# Patient Record
Sex: Male | Born: 1945 | Race: White | Hispanic: No | Marital: Married | Smoking: Never smoker
Health system: Southern US, Community
[De-identification: ages and names within clinical notes are randomized; demographics above are authoritative.]

## PROBLEM LIST (undated history)

## (undated) DIAGNOSIS — K5790 Diverticulosis of intestine, part unspecified, without perforation or abscess without bleeding: Secondary | ICD-10-CM

## (undated) DIAGNOSIS — Z87448 Personal history of other diseases of urinary system: Secondary | ICD-10-CM

## (undated) DIAGNOSIS — M85852 Other specified disorders of bone density and structure, left thigh: Secondary | ICD-10-CM

## (undated) DIAGNOSIS — I10 Essential (primary) hypertension: Secondary | ICD-10-CM

## (undated) DIAGNOSIS — S72002D Fracture of unspecified part of neck of left femur, subsequent encounter for closed fracture with routine healing: Secondary | ICD-10-CM

## (undated) DIAGNOSIS — M5126 Other intervertebral disc displacement, lumbar region: Secondary | ICD-10-CM

## (undated) DIAGNOSIS — E663 Overweight: Secondary | ICD-10-CM

## (undated) DIAGNOSIS — E785 Hyperlipidemia, unspecified: Secondary | ICD-10-CM

## (undated) DIAGNOSIS — N529 Male erectile dysfunction, unspecified: Secondary | ICD-10-CM

## (undated) DIAGNOSIS — H269 Unspecified cataract: Secondary | ICD-10-CM

## (undated) DIAGNOSIS — K219 Gastro-esophageal reflux disease without esophagitis: Secondary | ICD-10-CM

## (undated) DIAGNOSIS — R195 Other fecal abnormalities: Secondary | ICD-10-CM

## (undated) DIAGNOSIS — A419 Sepsis, unspecified organism: Secondary | ICD-10-CM

## (undated) DIAGNOSIS — M1712 Unilateral primary osteoarthritis, left knee: Secondary | ICD-10-CM

## (undated) DIAGNOSIS — I82409 Acute embolism and thrombosis of unspecified deep veins of unspecified lower extremity: Secondary | ICD-10-CM

## (undated) DIAGNOSIS — E119 Type 2 diabetes mellitus without complications: Secondary | ICD-10-CM

## (undated) HISTORY — PX: SHOULDER ARTHROSCOPY: SHX128

## (undated) HISTORY — PX: COLONOSCOPY: SHX174

## (undated) HISTORY — PX: VASECTOMY: SHX75

## (undated) HISTORY — PX: EYE SURGERY: SHX253

---

## 2005-04-04 ENCOUNTER — Ambulatory Visit: Payer: Self-pay | Admitting: Unknown Physician Specialty

## 2010-07-25 ENCOUNTER — Ambulatory Visit: Payer: Self-pay | Admitting: Unknown Physician Specialty

## 2015-02-17 ENCOUNTER — Telehealth: Payer: Self-pay | Admitting: Family Medicine

## 2015-02-17 NOTE — Telephone Encounter (Signed)
Was a patient of Dr Gwenith Daily. He is now transferring to [employer]. Patient will be running out to his medications soon. [facility] will be faxing over the medical release. Patient is requesting that we please send over at least his last office note and medication list to [facility] so he can get his refills. They will be faxing over a medical release.

## 2015-02-17 NOTE — Telephone Encounter (Signed)
Last note from the [facility] has been printed.  Awaking the medical release form from [facility] and then notes will be sent.

## 2015-02-24 NOTE — Telephone Encounter (Signed)
Sending to you

## 2015-03-07 NOTE — Telephone Encounter (Signed)
Routed to front desk this call is for records release

## 2015-07-25 ENCOUNTER — Encounter: Payer: Self-pay | Admitting: *Deleted

## 2015-07-26 ENCOUNTER — Encounter
Admission: RE | Disposition: A | Discharge status: Home / Self Care | Payer: Self-pay | Source: Ambulatory Visit | Attending: Unknown Physician Specialty

## 2015-07-26 ENCOUNTER — Encounter: Payer: Self-pay | Admitting: Anesthesiology

## 2015-07-26 ENCOUNTER — Ambulatory Visit: Payer: Medicare Other | Admitting: Anesthesiology

## 2015-07-26 ENCOUNTER — Ambulatory Visit
Admission: RE | Admit: 2015-07-26 | Discharge: 2015-07-26 | Disposition: A | Discharge status: Home / Self Care | Payer: Medicare Other | Source: Ambulatory Visit | Attending: Unknown Physician Specialty | Admitting: Unknown Physician Specialty

## 2015-07-26 DIAGNOSIS — M1712 Unilateral primary osteoarthritis, left knee: Secondary | ICD-10-CM | POA: Insufficient documentation

## 2015-07-26 DIAGNOSIS — K5730 Diverticulosis of large intestine without perforation or abscess without bleeding: Secondary | ICD-10-CM | POA: Diagnosis not present

## 2015-07-26 DIAGNOSIS — Z79899 Other long term (current) drug therapy: Secondary | ICD-10-CM | POA: Diagnosis not present

## 2015-07-26 DIAGNOSIS — K640 First degree hemorrhoids: Secondary | ICD-10-CM | POA: Insufficient documentation

## 2015-07-26 DIAGNOSIS — E119 Type 2 diabetes mellitus without complications: Secondary | ICD-10-CM | POA: Diagnosis not present

## 2015-07-26 DIAGNOSIS — Z7982 Long term (current) use of aspirin: Secondary | ICD-10-CM | POA: Insufficient documentation

## 2015-07-26 DIAGNOSIS — Z6825 Body mass index (BMI) 25.0-25.9, adult: Secondary | ICD-10-CM | POA: Diagnosis not present

## 2015-07-26 DIAGNOSIS — Z9852 Vasectomy status: Secondary | ICD-10-CM | POA: Diagnosis not present

## 2015-07-26 DIAGNOSIS — Z9889 Other specified postprocedural states: Secondary | ICD-10-CM | POA: Diagnosis not present

## 2015-07-26 DIAGNOSIS — Z800 Family history of malignant neoplasm of digestive organs: Secondary | ICD-10-CM | POA: Insufficient documentation

## 2015-07-26 DIAGNOSIS — I10 Essential (primary) hypertension: Secondary | ICD-10-CM | POA: Diagnosis not present

## 2015-07-26 DIAGNOSIS — K219 Gastro-esophageal reflux disease without esophagitis: Secondary | ICD-10-CM | POA: Insufficient documentation

## 2015-07-26 DIAGNOSIS — Z1211 Encounter for screening for malignant neoplasm of colon: Secondary | ICD-10-CM | POA: Insufficient documentation

## 2015-07-26 DIAGNOSIS — E785 Hyperlipidemia, unspecified: Secondary | ICD-10-CM | POA: Diagnosis not present

## 2015-07-26 DIAGNOSIS — H269 Unspecified cataract: Secondary | ICD-10-CM | POA: Insufficient documentation

## 2015-07-26 DIAGNOSIS — E663 Overweight: Secondary | ICD-10-CM | POA: Insufficient documentation

## 2015-07-26 HISTORY — DX: Diverticulosis of intestine, part unspecified, without perforation or abscess without bleeding: K57.90

## 2015-07-26 HISTORY — DX: Type 2 diabetes mellitus without complications: E11.9

## 2015-07-26 HISTORY — PX: COLONOSCOPY: SHX5424

## 2015-07-26 HISTORY — DX: Overweight: E66.3

## 2015-07-26 HISTORY — DX: Other intervertebral disc displacement, lumbar region: M51.26

## 2015-07-26 HISTORY — DX: Gastro-esophageal reflux disease without esophagitis: K21.9

## 2015-07-26 HISTORY — DX: Other fecal abnormalities: R19.5

## 2015-07-26 HISTORY — DX: Essential (primary) hypertension: I10

## 2015-07-26 HISTORY — DX: Unilateral primary osteoarthritis, left knee: M17.12

## 2015-07-26 HISTORY — DX: Unspecified cataract: H26.9

## 2015-07-26 HISTORY — DX: Hyperlipidemia, unspecified: E78.5

## 2015-07-26 HISTORY — DX: Male erectile dysfunction, unspecified: N52.9

## 2015-07-26 LAB — GLUCOSE, CAPILLARY: GLUCOSE-CAPILLARY: 153 mg/dL — AB (ref 65–99)

## 2015-07-26 SURGERY — COLONOSCOPY
Anesthesia: General

## 2015-07-26 MED ORDER — FENTANYL CITRATE (PF) 100 MCG/2ML IJ SOLN
INTRAMUSCULAR | Status: DC | PRN
  Administered 2015-07-26: 50 ug via INTRAVENOUS

## 2015-07-26 MED ORDER — SODIUM CHLORIDE 0.9 % IV SOLN: INTRAVENOUS | Status: DC

## 2015-07-26 MED ORDER — PROPOFOL 500 MG/50ML IV EMUL
INTRAVENOUS | Status: DC | PRN
  Administered 2015-07-26: 140 ug/kg/min via INTRAVENOUS

## 2015-07-26 MED ORDER — PROPOFOL 10 MG/ML IV BOLUS
INTRAVENOUS | Status: DC | PRN
  Administered 2015-07-26: 40 mg via INTRAVENOUS

## 2015-07-26 MED ORDER — SODIUM CHLORIDE 0.9 % IV SOLN
INTRAVENOUS | Status: DC
  Administered 2015-07-26: 09:00:00 via INTRAVENOUS

## 2015-07-26 MED ORDER — MIDAZOLAM HCL 5 MG/5ML IJ SOLN
INTRAMUSCULAR | Status: DC | PRN
  Administered 2015-07-26: 1 mg via INTRAVENOUS

## 2015-07-26 MED ORDER — EPHEDRINE SULFATE 50 MG/ML IJ SOLN
INTRAMUSCULAR | Status: DC | PRN
  Administered 2015-07-26: 5 mg via INTRAVENOUS

## 2015-07-26 NOTE — Anesthesia Postprocedure Evaluation (Signed)
Anesthesia Post Note  Patient: Douglas Lyons  Procedure(s) Performed: Procedure(s) (LRB): COLONOSCOPY (N/A)  Patient location during evaluation: PACU Anesthesia Type: General Level of consciousness: awake Pain management: pain level controlled Vital Signs Assessment: post-procedure vital signs reviewed and stable Respiratory status: spontaneous breathing Cardiovascular status: blood pressure returned to baseline Anesthetic complications: no    Last Vitals:  Filed Vitals:   07/26/15 1000 07/26/15 1010  BP: 122/52 122/58  Pulse: 57 53  Temp:    Resp: 14 16    Last Pain: There were no vitals filed for this visit.               Erhardt Dada S

## 2015-07-26 NOTE — H&P (Signed)
   Primary Care Physician:  Pcp Not In System Primary Gastroenterologist:  Dr. Vira Agar  Pre-Procedure History & Physical: HPI:  Douglas Lyons is a 69 y.o. male is here for an colonoscopy.   Past Medical History  Diagnosis Date  . Cataracts, bilateral   . Diabetes mellitus, type 2 ([place])   . Diverticulosis   . Degenerative joint disease of knee, left   . Erectile dysfunction   . GERD (gastroesophageal reflux disease)   . Heme positive stool   . Hyperlipidemia   . Hypertension   . Lumbar disc herniation   . Overweight (BMI 25.0-29.9)     Past Surgical History  Procedure Laterality Date  . Shoulder arthroscopy Right   . Vasectomy    . Colonoscopy      Prior to Admission medications   Medication Sig Start Date End Date Taking? Authorizing Provider  aspirin EC 81 MG tablet Take 81 mg by mouth daily.   Yes Historical Provider, MD  enalapril (VASOTEC) 5 MG tablet Take 5 mg by mouth daily.   Yes Historical Provider, MD  glipiZIDE-metformin (METAGLIP) 2.5-500 MG tablet Take 1 tablet by mouth 2 (two) times daily before a meal.   Yes Historical Provider, MD  polyethylene glycol powder (GLYCOLAX/MIRALAX) powder Take 0.5 Containers by mouth once.   Yes Historical Provider, MD  rosuvastatin (CRESTOR) 20 MG tablet Take 20 mg by mouth daily.   Yes Historical Provider, MD    Allergies as of 06/16/2015  . (Not on File)    History reviewed. No pertinent family history.  Social History   Social History  . Marital Status: Married    Spouse Name: N/A  . Number of Children: N/A  . Years of Education: N/A   Occupational History  . Not on file.   Social History Main Topics  . Smoking status: Never Smoker   . Smokeless tobacco: Not on file  . Alcohol Use: Not on file  . Drug Use: Not on file  . Sexual Activity: Not on file   Other Topics Concern  . Not on file   Social History Narrative    Review of Systems: See HPI, otherwise negative ROS  Physical Exam: BP 145/76 mmHg   Pulse 67  Temp(Src) 98.6 F (37 C) (Oral)  Resp 17  Ht 5\' 10"  (1.778 m)  Wt 98.884 kg (218 lb)  BMI 31.28 kg/m2  SpO2 100% General:   Alert,  pleasant and cooperative in NAD Head:  Normocephalic and atraumatic. Neck:  Supple; no masses or thyromegaly. Lungs:  Clear throughout to auscultation.    Heart:  Regular rate and rhythm. Abdomen:  Soft, nontender and nondistended. Normal bowel sounds, without guarding, and without rebound.   Neurologic:  Alert and  oriented x4;  grossly normal neurologically.  Impression/Plan: Douglas Lyons is here for an colonoscopy to be performed for FH colon cancer in father  Risks, benefits, limitations, and alternatives regarding  colonoscopy have been reviewed with the patient.  Questions have been answered.  All parties agreeable.   Gaylyn Cheers, MD  07/26/2015, 9:08 AM

## 2015-07-26 NOTE — Anesthesia Procedure Notes (Signed)
Date/Time: 07/26/2015 9:12 AM Performed by: Kennon Holter Pre-anesthesia Checklist: Timeout performed, Patient being monitored, Patient identified, Emergency Drugs available and Suction available Patient Re-evaluated:Patient Re-evaluated prior to inductionOxygen Delivery Method: Nasal cannula Preoxygenation: Pre-oxygenation with 100% oxygen Intubation Type: IV induction

## 2015-07-26 NOTE — Op Note (Signed)
[facility] Gastroenterology Patient Name: Douglas Lyons Procedure Date: 07/26/2015 8:46 AM MRN: [id number] Account #: [id number] Date of Birth: 06-05-1946 Admit Type: Outpatient Age: 69 Room: [facility] ENDO ROOM 3 Gender: Male Note Status: Finalized Procedure:         Colonoscopy Indications:       Screening in patient at increased risk: Family history of                     1st-degree relative with colorectal cancer Providers:         Manya Silvas, MD Medicines:         Propofol per Anesthesia Complications:     No immediate complications. Procedure:         Pre-Anesthesia Assessment:                    - After reviewing the risks and benefits, the patient was                     deemed in satisfactory condition to undergo the procedure.                    After obtaining informed consent, the colonoscope was                     passed under direct vision. Throughout the procedure, the                     patient's blood pressure, pulse, and oxygen saturations                     were monitored continuously. The Olympus PCF-H180AL                     colonoscope ( S#: [id number] ) was introduced through the                     anus and advanced to the the cecum, identified by                     appendiceal orifice and ileocecal valve. The colonoscopy                     was performed without difficulty. The patient tolerated                     the procedure well. The quality of the bowel preparation                     was good. Findings:      A few small-mouthed diverticula were found in the sigmoid colon.      Internal hemorrhoids were found during endoscopy. The hemorrhoids were       small and Grade I (internal hemorrhoids that do not prolapse).      The exam was otherwise without abnormality. Impression:        - Diverticulosis in the sigmoid colon.                    - Internal hemorrhoids.                    - The examination was otherwise normal.               - No specimens collected. Recommendation:    -  Repeat colonoscopy in 5 years for screening purposes. Manya Silvas, MD 07/26/2015 9:27:09 AM This report has been signed electronically. Number of Addenda: 0 Note Initiated On: 07/26/2015 8:46 AM Scope Withdrawal Time: 0 hours 4 minutes 48 seconds  Total Procedure Duration: 0 hours 10 minutes 3 seconds       [facility]

## 2015-07-26 NOTE — Anesthesia Preprocedure Evaluation (Signed)
Anesthesia Evaluation  Patient identified by MRN, date of birth, ID band Patient awake    Reviewed: Allergy & Precautions, NPO status , Patient's Chart, lab work & pertinent test results, reviewed documented beta blocker date and time   Airway Mallampati: II  TM Distance: >3 FB     Dental  (+) Chipped   Pulmonary           Cardiovascular hypertension, Pt. on medications      Neuro/Psych    GI/Hepatic GERD  ,  Endo/Other  diabetes, Well Controlled, Type 2  Renal/GU      Musculoskeletal  (+) Arthritis ,   Abdominal   Peds  Hematology   Anesthesia Other Findings   Reproductive/Obstetrics                             Anesthesia Physical Anesthesia Plan  ASA: III  Anesthesia Plan: General   Post-op Pain Management:    Induction: Intravenous  Airway Management Planned: Nasal Cannula  Additional Equipment:   Intra-op Plan:   Post-operative Plan:   Informed Consent: I have reviewed the patients History and Physical, chart, labs and discussed the procedure including the risks, benefits and alternatives for the proposed anesthesia with the patient or authorized representative who has indicated his/her understanding and acceptance.     Plan Discussed with: CRNA  Anesthesia Plan Comments:         Anesthesia Quick Evaluation

## 2015-07-26 NOTE — Transfer of Care (Signed)
zImmediate Anesthesia Transfer of Care Note  Patient: Douglas Lyons  Procedure(s) Performed: Procedure(s): COLONOSCOPY (N/A)  Patient Location: PACU and Endoscopy Unit  Anesthesia Type:General  Level of Consciousness: awake and sedated  Airway & Oxygen Therapy: Patient Spontanous Breathing and Patient connected to nasal cannula oxygen  Post-op Assessment: Report given to RN and Post -op Vital signs reviewed and stable  Post vital signs: Reviewed and stable  Last Vitals:  Filed Vitals:   07/26/15 0813 07/26/15 0929  BP: 145/76   Pulse: 67   Temp: 37 C 35.8 C  Resp: 17     Complications: No apparent anesthesia complications.  Patient tolerated procedure well.

## 2015-07-31 ENCOUNTER — Encounter: Payer: Self-pay | Admitting: Unknown Physician Specialty

## 2019-04-22 ENCOUNTER — Other Ambulatory Visit: Payer: Self-pay

## 2019-04-22 DIAGNOSIS — Z20822 Contact with and (suspected) exposure to covid-19: Secondary | ICD-10-CM

## 2019-04-23 LAB — NOVEL CORONAVIRUS, NAA: SARS-CoV-2, NAA: NOT DETECTED

## 2020-03-13 DIAGNOSIS — Z85828 Personal history of other malignant neoplasm of skin: Secondary | ICD-10-CM | POA: Insufficient documentation

## 2020-09-27 ENCOUNTER — Other Ambulatory Visit: Payer: Self-pay

## 2020-09-27 ENCOUNTER — Other Ambulatory Visit
Admission: RE | Admit: 2020-09-27 | Discharge: 2020-09-27 | Disposition: A | Discharge status: Home / Self Care | Payer: Medicare Other | Source: Ambulatory Visit | Attending: Gastroenterology | Admitting: Gastroenterology

## 2020-09-27 DIAGNOSIS — Z01812 Encounter for preprocedural laboratory examination: Secondary | ICD-10-CM | POA: Insufficient documentation

## 2020-09-27 DIAGNOSIS — Z20822 Contact with and (suspected) exposure to covid-19: Secondary | ICD-10-CM | POA: Insufficient documentation

## 2020-09-27 LAB — SARS CORONAVIRUS 2 (TAT 6-24 HRS): SARS Coronavirus 2: NEGATIVE

## 2020-09-29 ENCOUNTER — Ambulatory Visit
Admission: RE | Admit: 2020-09-29 | Discharge: 2020-09-29 | Disposition: A | Discharge status: Home / Self Care | Payer: Medicare Other | Attending: Gastroenterology | Admitting: Gastroenterology

## 2020-09-29 ENCOUNTER — Encounter: Payer: Self-pay | Admitting: *Deleted

## 2020-09-29 ENCOUNTER — Encounter
Admission: RE | Disposition: A | Discharge status: Home / Self Care | Payer: Self-pay | Source: Home / Self Care | Attending: Gastroenterology

## 2020-09-29 ENCOUNTER — Other Ambulatory Visit: Payer: Self-pay

## 2020-09-29 ENCOUNTER — Ambulatory Visit: Payer: Medicare Other | Admitting: Anesthesiology

## 2020-09-29 DIAGNOSIS — Z1211 Encounter for screening for malignant neoplasm of colon: Secondary | ICD-10-CM | POA: Diagnosis not present

## 2020-09-29 DIAGNOSIS — Z79899 Other long term (current) drug therapy: Secondary | ICD-10-CM | POA: Insufficient documentation

## 2020-09-29 DIAGNOSIS — K5730 Diverticulosis of large intestine without perforation or abscess without bleeding: Secondary | ICD-10-CM | POA: Insufficient documentation

## 2020-09-29 DIAGNOSIS — Z7984 Long term (current) use of oral hypoglycemic drugs: Secondary | ICD-10-CM | POA: Diagnosis not present

## 2020-09-29 DIAGNOSIS — D123 Benign neoplasm of transverse colon: Secondary | ICD-10-CM | POA: Diagnosis not present

## 2020-09-29 DIAGNOSIS — Z7982 Long term (current) use of aspirin: Secondary | ICD-10-CM | POA: Insufficient documentation

## 2020-09-29 DIAGNOSIS — Z8371 Family history of colonic polyps: Secondary | ICD-10-CM | POA: Diagnosis not present

## 2020-09-29 DIAGNOSIS — K640 First degree hemorrhoids: Secondary | ICD-10-CM | POA: Diagnosis not present

## 2020-09-29 DIAGNOSIS — Z800 Family history of malignant neoplasm of digestive organs: Secondary | ICD-10-CM | POA: Diagnosis not present

## 2020-09-29 HISTORY — PX: COLONOSCOPY: SHX5424

## 2020-09-29 LAB — GLUCOSE, CAPILLARY: Glucose-Capillary: 151 mg/dL — ABNORMAL HIGH (ref 70–99)

## 2020-09-29 SURGERY — COLONOSCOPY
Anesthesia: General

## 2020-09-29 MED ORDER — PROPOFOL 500 MG/50ML IV EMUL
INTRAVENOUS | Status: AC
  Filled 2020-09-29: qty 50

## 2020-09-29 MED ORDER — FENTANYL CITRATE (PF) 100 MCG/2ML IJ SOLN
INTRAMUSCULAR | Status: AC
  Filled 2020-09-29: qty 2

## 2020-09-29 MED ORDER — PROPOFOL 500 MG/50ML IV EMUL
INTRAVENOUS | Status: DC | PRN
  Administered 2020-09-29: 50 ug/kg/min via INTRAVENOUS

## 2020-09-29 MED ORDER — SODIUM CHLORIDE 0.9 % IV SOLN: INTRAVENOUS | Status: DC

## 2020-09-29 MED ORDER — MIDAZOLAM HCL 5 MG/5ML IJ SOLN
INTRAMUSCULAR | Status: DC | PRN
  Administered 2020-09-29 (×2): 1 mg via INTRAVENOUS

## 2020-09-29 MED ORDER — PROPOFOL 10 MG/ML IV BOLUS
INTRAVENOUS | Status: DC | PRN
  Administered 2020-09-29: 10 mg via INTRAVENOUS
  Administered 2020-09-29 (×2): 20 mg via INTRAVENOUS

## 2020-09-29 MED ORDER — FENTANYL CITRATE (PF) 100 MCG/2ML IJ SOLN
INTRAMUSCULAR | Status: DC | PRN
  Administered 2020-09-29 (×4): 25 ug via INTRAVENOUS

## 2020-09-29 MED ORDER — LIDOCAINE HCL (PF) 2 % IJ SOLN
INTRAMUSCULAR | Status: DC | PRN
  Administered 2020-09-29: 50 mg

## 2020-09-29 MED ORDER — MIDAZOLAM HCL 2 MG/2ML IJ SOLN
INTRAMUSCULAR | Status: AC
  Filled 2020-09-29: qty 2

## 2020-09-29 NOTE — Interval H&P Note (Signed)
History and Physical Interval Note:  09/29/2020 10:00 AM  Douglas Lyons  has presented today for surgery, with the diagnosis of FAM HX OF COLON CANCER.  The various methods of treatment have been discussed with the patient and family. After consideration of risks, benefits and other options for treatment, the patient has consented to  Procedure(s): COLONOSCOPY (N/A) as a surgical intervention.  The patient's history has been reviewed, patient examined, no change in status, stable for surgery.  I have reviewed the patient's chart and labs.  Questions were answered to the patient's satisfaction.     [name]  Ok to proceed with colonoscopy

## 2020-09-29 NOTE — Op Note (Signed)
[facility] Gastroenterology Patient Name: Douglas Lyons Procedure Date: 09/29/2020 9:51 AM MRN: [id number] Account #: [id number] Date of Birth: August 06, 1946 Admit Type: Outpatient Age: 75 Room: [facility] ENDO ROOM 3 Gender: Male Note Status: Finalized Procedure:             Colonoscopy Indications:           Screening in patient at increased risk: Family history                         of 1st-degree relative with colorectal cancer Providers:             Andrey Farmer MD, MD Referring MD:          Juluis Rainier (Referring MD) Medicines:             Monitored Anesthesia Care Complications:         No immediate complications. Estimated blood loss:                         Minimal. Procedure:             Pre-Anesthesia Assessment:                        - Prior to the procedure, a History and Physical was                         performed, and patient medications and allergies were                         reviewed. The patient is competent. The risks and                         benefits of the procedure and the sedation options and                         risks were discussed with the patient. All questions                         were answered and informed consent was obtained.                         Patient identification and proposed procedure were                         verified by the physician, the nurse, the anesthetist                         and the technician in the endoscopy suite. Mental                         Status Examination: alert and oriented. Airway                         Examination: normal oropharyngeal airway and neck                         mobility. Respiratory Examination: clear to  auscultation. CV Examination: normal. Prophylactic                         Antibiotics: The patient does not require prophylactic                         antibiotics. Prior Anticoagulants: The patient has                         taken no  previous anticoagulant or antiplatelet                         agents. ASA Grade Assessment: II - A patient with mild                         systemic disease. After reviewing the risks and                         benefits, the patient was deemed in satisfactory                         condition to undergo the procedure. The anesthesia                         plan was to use monitored anesthesia care (MAC).                         Immediately prior to administration of medications,                         the patient was re-assessed for adequacy to receive                         sedatives. The heart rate, respiratory rate, oxygen                         saturations, blood pressure, adequacy of pulmonary                         ventilation, and response to care were monitored                         throughout the procedure. The physical status of the                         patient was re-assessed after the procedure.                        After obtaining informed consent, the colonoscope was                         passed under direct vision. Throughout the procedure,                         the patient's blood pressure, pulse, and oxygen                         saturations were monitored continuously. The  Colonoscope was introduced through the anus and                         advanced to the the cecum, identified by appendiceal                         orifice and ileocecal valve. The colonoscopy was                         performed without difficulty. The patient tolerated                         the procedure well. The quality of the bowel                         preparation was good. Findings:      The perianal and digital rectal examinations were normal.      A less than 1 mm polyp was found in the proximal transverse colon. The       polyp was sessile. The polyp was removed with a jumbo cold forceps.       Resection and retrieval were complete. Estimated  blood loss was minimal.      A few small-mouthed diverticula were found in the sigmoid colon.      Internal hemorrhoids were found during retroflexion. The hemorrhoids       were Grade I (internal hemorrhoids that do not prolapse).      The exam was otherwise without abnormality on direct and retroflexion       views. Impression:            - One less than 1 mm polyp in the proximal transverse                         colon, removed with a jumbo cold forceps. Resected and                         retrieved.                        - Diverticulosis in the sigmoid colon.                        - Internal hemorrhoids.                        - The examination was otherwise normal on direct and                         retroflexion views. Recommendation:        - Discharge patient to home.                        - Resume previous diet.                        - Continue present medications.                        - Await pathology results.                        -  Repeat colonoscopy is not recommended due to current                         age (22 years or older) for surveillance.                        - Return to referring physician as previously                         scheduled. Procedure Code(s):     --- Professional ---                        [phone], Colonoscopy, flexible; with biopsy, single or                         multiple Diagnosis Code(s):     --- Professional ---                        Z80.0, Family history of malignant neoplasm of                         digestive organs                        K63.5, Polyp of colon                        K64.0, First degree hemorrhoids                        K57.30, Diverticulosis of large intestine without                         perforation or abscess without bleeding CPT copyright 2019 American Medical Association. All rights reserved. The codes documented in this report are preliminary and upon coder review may  be revised to meet current  compliance requirements. Andrey Farmer MD, MD 09/29/2020 10:29:52 AM Number of Addenda: 0 Note Initiated On: 09/29/2020 9:51 AM Scope Withdrawal Time: 0 hours 6 minutes 48 seconds  Total Procedure Duration: 0 hours 13 minutes 49 seconds  Estimated Blood Loss:  Estimated blood loss was minimal.      [facility]

## 2020-09-29 NOTE — Anesthesia Preprocedure Evaluation (Signed)
Anesthesia Evaluation  Patient identified by MRN, date of birth, ID band Patient awake    Reviewed: Allergy & Precautions, NPO status , Patient's Chart, lab work & pertinent test results  History of Anesthesia Complications Negative for: history of anesthetic complications  Airway Mallampati: II       Dental   Pulmonary neg sleep apnea, neg COPD, Not current smoker,           Cardiovascular hypertension, Pt. on medications (-) Past MI and (-) CHF (-) dysrhythmias (-) Valvular Problems/Murmurs     Neuro/Psych neg Seizures    GI/Hepatic Neg liver ROS, neg GERD  ,  Endo/Other  diabetes, Type 2, Oral Hypoglycemic Agents  Renal/GU negative Renal ROS     Musculoskeletal   Abdominal   Peds  Hematology   Anesthesia Other Findings   Reproductive/Obstetrics                             Anesthesia Physical Anesthesia Plan  ASA: III  Anesthesia Plan: General   Post-op Pain Management:    Induction: Intravenous  PONV Risk Score and Plan: 2 and Propofol infusion and TIVA  Airway Management Planned: Nasal Cannula  Additional Equipment:   Intra-op Plan:   Post-operative Plan:   Informed Consent: I have reviewed the patients History and Physical, chart, labs and discussed the procedure including the risks, benefits and alternatives for the proposed anesthesia with the patient or authorized representative who has indicated his/her understanding and acceptance.       Plan Discussed with:   Anesthesia Plan Comments:         Anesthesia Quick Evaluation  

## 2020-09-29 NOTE — Anesthesia Postprocedure Evaluation (Signed)
Anesthesia Post Note  Patient: Douglas Lyons  Procedure(s) Performed: COLONOSCOPY (N/A )  Patient location during evaluation: Endoscopy Anesthesia Type: General Level of consciousness: awake and alert Pain management: pain level controlled Vital Signs Assessment: post-procedure vital signs reviewed and stable Respiratory status: spontaneous breathing and respiratory function stable Cardiovascular status: stable Anesthetic complications: no   No complications documented.   Last Vitals:  Vitals:   09/29/20 0857 09/29/20 1029  BP: 139/77   Pulse: 73 62  Resp: 18 11  Temp: 36.8 C (!) 35.9 C  SpO2: 100% 99%    Last Pain:  Vitals:   09/29/20 1029  TempSrc: Temporal  PainSc: Asleep                 [name]

## 2020-09-29 NOTE — Transfer of Care (Signed)
Immediate Anesthesia Transfer of Care Note  Patient: Douglas Lyons  Procedure(s) Performed: COLONOSCOPY (N/A )  Patient Location: PACU  Anesthesia Type:General  Level of Consciousness: sedated  Airway & Oxygen Therapy: Patient Spontanous Breathing and Patient connected to nasal cannula oxygen  Post-op Assessment: Report given to RN and Post -op Vital signs reviewed and stable  Post vital signs: Reviewed and stable  Last Vitals:  Vitals Value Taken Time  BP 123/66 09/29/20 1029  Temp 35.9 C 09/29/20 1029  Pulse 61 09/29/20 1030  Resp 11 09/29/20 1030  SpO2 100 % 09/29/20 1030  Vitals shown include unvalidated device data.  Last Pain:  Vitals:   09/29/20 1029  TempSrc: Temporal  PainSc: Asleep         Complications: No complications documented.

## 2020-09-29 NOTE — H&P (Signed)
Outpatient short stay form Pre-procedure 09/29/2020 9:58 AM Raylene Miyamoto MD, MPH  Primary Physician: NP Gauger  Reason for visit:  Family history of colon cancer  History of present illness:   75 y/o gentleman with family history of colon cancer in his father in his 69's. Previous colonoscopies without polyps. No blood thinners. No abdominal surgeries. No new GI symptoms.    Current Facility-Administered Medications:  .  0.9 %  sodium chloride infusion, , Intravenous, Continuous, Darthula Desa, Hilton Cork, MD, Last Rate: 20 mL/hr at 09/29/20 8502, Continued from Pre-op at 09/29/20 0956  Medications Prior to Admission  Medication Sig Dispense Refill Last Dose  . aspirin EC 81 MG tablet Take 81 mg by mouth daily.   Past Week at Unknown time  . enalapril (VASOTEC) 5 MG tablet Take 5 mg by mouth daily.   09/29/2020 at Unknown time  . glipiZIDE-metformin (METAGLIP) 2.5-500 MG tablet Take 1 tablet by mouth 2 (two) times daily before a meal.   09/28/2020 at Unknown time  . polyethylene glycol powder (GLYCOLAX/MIRALAX) powder Take 0.5 Containers by mouth once.   09/28/2020 at Unknown time  . rosuvastatin (CRESTOR) 20 MG tablet Take 20 mg by mouth daily.   09/28/2020 at Unknown time     No Known Allergies   Past Medical History:  Diagnosis Date  . Cataracts, bilateral   . Degenerative joint disease of knee, left   . Diabetes mellitus, type 2 ([place])   . Diverticulosis   . Erectile dysfunction   . GERD (gastroesophageal reflux disease)   . Heme positive stool   . Hyperlipidemia   . Hypertension   . Lumbar disc herniation   . Overweight (BMI 25.0-29.9)     Review of systems:  Otherwise negative.    Physical Exam  Gen: Alert, oriented. Appears stated age.  HEENT: PERRLA. Lungs: No respiratory distress CV: RRR Abd: soft, benign, no masses Ext: No edema    Planned procedures: Proceed with colonoscopy. The patient understands the nature of the planned procedure, indications, risks,  alternatives and potential complications including but not limited to bleeding, infection, perforation, damage to internal organs and possible oversedation/side effects from anesthesia. The patient agrees and gives consent to proceed.  Please refer to procedure notes for findings, recommendations and patient disposition/instructions.     Raylene Miyamoto MD, MPH Gastroenterology 09/29/2020  9:58 AM

## 2020-10-02 LAB — SURGICAL PATHOLOGY

## 2020-11-26 ENCOUNTER — Other Ambulatory Visit: Payer: Self-pay

## 2020-11-26 ENCOUNTER — Encounter: Payer: Self-pay | Admitting: Emergency Medicine

## 2020-11-26 ENCOUNTER — Emergency Department: Payer: Medicare Other

## 2020-11-26 ENCOUNTER — Inpatient Hospital Stay
Admission: EM | Admit: 2020-11-26 | Discharge: 2020-12-04 | DRG: 472 | Disposition: A | Discharge status: Home Health | Payer: Medicare Other | Attending: Neurosurgery | Admitting: Neurosurgery

## 2020-11-26 DIAGNOSIS — K219 Gastro-esophageal reflux disease without esophagitis: Secondary | ICD-10-CM | POA: Diagnosis present

## 2020-11-26 DIAGNOSIS — Z7982 Long term (current) use of aspirin: Secondary | ICD-10-CM

## 2020-11-26 DIAGNOSIS — Z6830 Body mass index (BMI) 30.0-30.9, adult: Secondary | ICD-10-CM

## 2020-11-26 DIAGNOSIS — Z20822 Contact with and (suspected) exposure to covid-19: Secondary | ICD-10-CM | POA: Diagnosis present

## 2020-11-26 DIAGNOSIS — Z9852 Vasectomy status: Secondary | ICD-10-CM

## 2020-11-26 DIAGNOSIS — I10 Essential (primary) hypertension: Secondary | ICD-10-CM | POA: Diagnosis present

## 2020-11-26 DIAGNOSIS — E785 Hyperlipidemia, unspecified: Secondary | ICD-10-CM | POA: Diagnosis present

## 2020-11-26 DIAGNOSIS — E119 Type 2 diabetes mellitus without complications: Secondary | ICD-10-CM | POA: Diagnosis present

## 2020-11-26 DIAGNOSIS — M4722 Other spondylosis with radiculopathy, cervical region: Secondary | ICD-10-CM | POA: Diagnosis present

## 2020-11-26 DIAGNOSIS — R1310 Dysphagia, unspecified: Secondary | ICD-10-CM | POA: Diagnosis not present

## 2020-11-26 DIAGNOSIS — K5790 Diverticulosis of intestine, part unspecified, without perforation or abscess without bleeding: Secondary | ICD-10-CM | POA: Diagnosis present

## 2020-11-26 DIAGNOSIS — G992 Myelopathy in diseases classified elsewhere: Secondary | ICD-10-CM | POA: Diagnosis present

## 2020-11-26 DIAGNOSIS — M50021 Cervical disc disorder at C4-C5 level with myelopathy: Secondary | ICD-10-CM | POA: Diagnosis present

## 2020-11-26 DIAGNOSIS — M1712 Unilateral primary osteoarthritis, left knee: Secondary | ICD-10-CM | POA: Diagnosis present

## 2020-11-26 DIAGNOSIS — R2681 Unsteadiness on feet: Secondary | ICD-10-CM | POA: Diagnosis present

## 2020-11-26 DIAGNOSIS — M5011 Cervical disc disorder with radiculopathy,  high cervical region: Secondary | ICD-10-CM | POA: Diagnosis present

## 2020-11-26 DIAGNOSIS — Z7984 Long term (current) use of oral hypoglycemic drugs: Secondary | ICD-10-CM

## 2020-11-26 DIAGNOSIS — M4802 Spinal stenosis, cervical region: Secondary | ICD-10-CM | POA: Diagnosis not present

## 2020-11-26 DIAGNOSIS — M4712 Other spondylosis with myelopathy, cervical region: Secondary | ICD-10-CM | POA: Diagnosis present

## 2020-11-26 DIAGNOSIS — R202 Paresthesia of skin: Secondary | ICD-10-CM

## 2020-11-26 DIAGNOSIS — E669 Obesity, unspecified: Secondary | ICD-10-CM | POA: Diagnosis present

## 2020-11-26 DIAGNOSIS — Z79899 Other long term (current) drug therapy: Secondary | ICD-10-CM

## 2020-11-26 DIAGNOSIS — M4322 Fusion of spine, cervical region: Secondary | ICD-10-CM

## 2020-11-26 DIAGNOSIS — R531 Weakness: Secondary | ICD-10-CM

## 2020-11-26 DIAGNOSIS — M5126 Other intervertebral disc displacement, lumbar region: Secondary | ICD-10-CM | POA: Diagnosis present

## 2020-11-26 DIAGNOSIS — M5001 Cervical disc disorder with myelopathy,  high cervical region: Secondary | ICD-10-CM | POA: Diagnosis present

## 2020-11-26 DIAGNOSIS — Z419 Encounter for procedure for purposes other than remedying health state, unspecified: Secondary | ICD-10-CM

## 2020-11-26 DIAGNOSIS — R609 Edema, unspecified: Secondary | ICD-10-CM | POA: Diagnosis not present

## 2020-11-26 DIAGNOSIS — M2578 Osteophyte, vertebrae: Secondary | ICD-10-CM | POA: Diagnosis present

## 2020-11-26 LAB — DIFFERENTIAL
Abs Immature Granulocytes: 0.02 10*3/uL (ref 0.00–0.07)
Basophils Absolute: 0 10*3/uL (ref 0.0–0.1)
Basophils Relative: 1 %
Eosinophils Absolute: 0.1 10*3/uL (ref 0.0–0.5)
Eosinophils Relative: 1 %
Immature Granulocytes: 0 %
Lymphocytes Relative: 16 %
Lymphs Abs: 1 10*3/uL (ref 0.7–4.0)
Monocytes Absolute: 0.6 10*3/uL (ref 0.1–1.0)
Monocytes Relative: 9 %
Neutro Abs: 4.7 10*3/uL (ref 1.7–7.7)
Neutrophils Relative %: 73 %

## 2020-11-26 LAB — COMPREHENSIVE METABOLIC PANEL
ALT: 12 U/L (ref 0–44)
AST: 12 U/L — ABNORMAL LOW (ref 15–41)
Albumin: 4.2 g/dL (ref 3.5–5.0)
Alkaline Phosphatase: 58 U/L (ref 38–126)
Anion gap: 9 (ref 5–15)
BUN: 16 mg/dL (ref 8–23)
CO2: 25 mmol/L (ref 22–32)
Calcium: 9.2 mg/dL (ref 8.9–10.3)
Chloride: 103 mmol/L (ref 98–111)
Creatinine, Ser: 0.69 mg/dL (ref 0.61–1.24)
GFR, Estimated: >60 mL/min (ref >60)
Glucose, Bld: 150 mg/dL — ABNORMAL HIGH (ref 70–99)
Potassium: 4 mmol/L (ref 3.5–5.1)
Sodium: 137 mmol/L (ref 135–145)
Total Bilirubin: 0.8 mg/dL (ref 0.3–1.2)
Total Protein: 7.1 g/dL (ref 6.5–8.1)

## 2020-11-26 LAB — PROTIME-INR
INR: 1 (ref 0.8–1.2)
Prothrombin Time: 13.1 seconds (ref 11.4–15.2)

## 2020-11-26 LAB — APTT: aPTT: 29 seconds (ref 24–36)

## 2020-11-26 LAB — CBC
HCT: 43.3 % (ref 39.0–52.0)
Hemoglobin: 14.4 g/dL (ref 13.0–17.0)
MCH: 29.6 pg (ref 26.0–34.0)
MCHC: 33.3 g/dL (ref 30.0–36.0)
MCV: 89.1 fL (ref 80.0–100.0)
Platelets: 214 10*3/uL (ref 150–400)
RBC: 4.86 MIL/uL (ref 4.22–5.81)
RDW: 12.9 % (ref 11.5–15.5)
WBC: 6.5 10*3/uL (ref 4.0–10.5)
nRBC: 0 % (ref 0.0–0.2)

## 2020-11-26 LAB — CBG MONITORING, ED: Glucose-Capillary: 154 mg/dL — ABNORMAL HIGH (ref 70–99)

## 2020-11-26 IMAGING — CT CT HEAD W/O CM
3 series · 16 of 47 positions shown, 19 images · non-contrast
Comparison: None.

CLINICAL DATA: Neuro deficit. Acute stroke suspected. Bilateral
high on numbness for 2 weeks.

EXAM:
CT HEAD WITHOUT CONTRAST
TECHNIQUE: Contiguous axial images were obtained from the base of the skull
through the vertex without intravenous contrast.

[Series 2: head wo · axial · 0.49mm/px · z∈[-204,-54]mm · 10 of 36 slices shown, 13 images]
[im 3/36  brain]
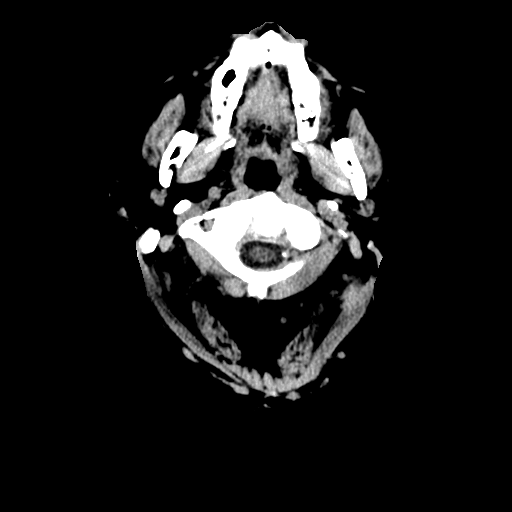
[im 3/36  bone]
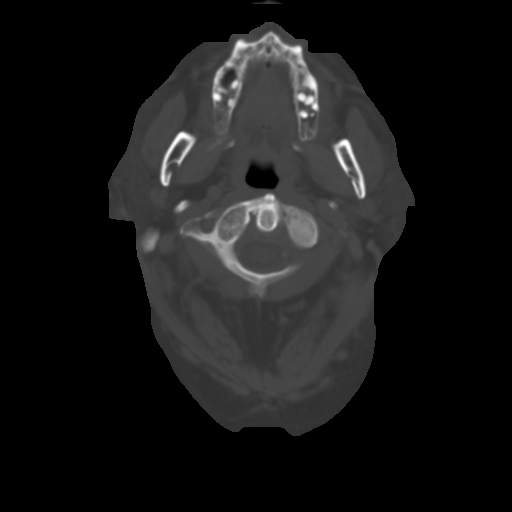
[im 7/36  brain]
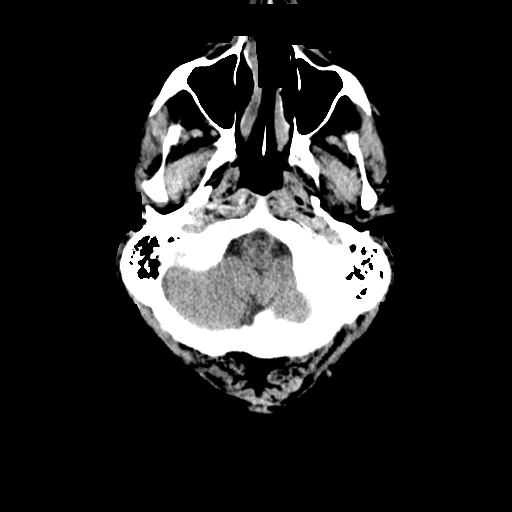
[im 10/36  brain]
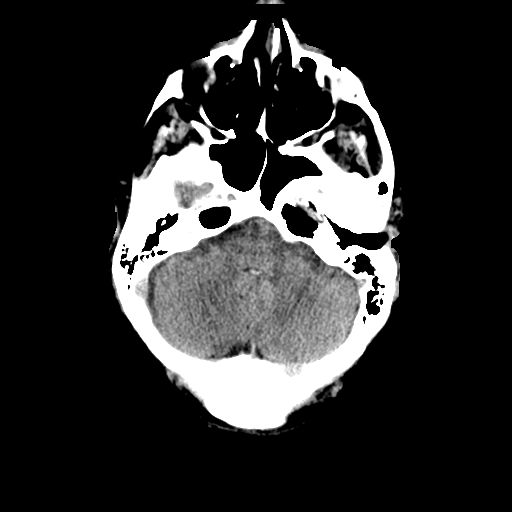
[im 13/36  brain]
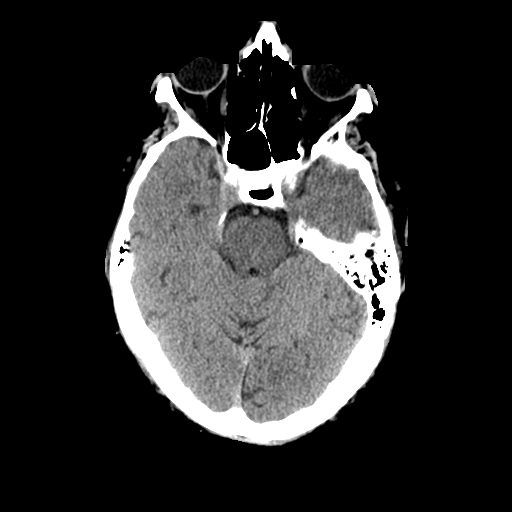
[im 16/36  brain]
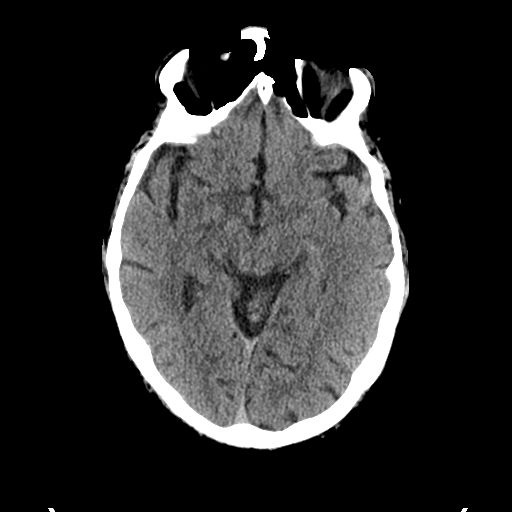
[im 16/36  bone]
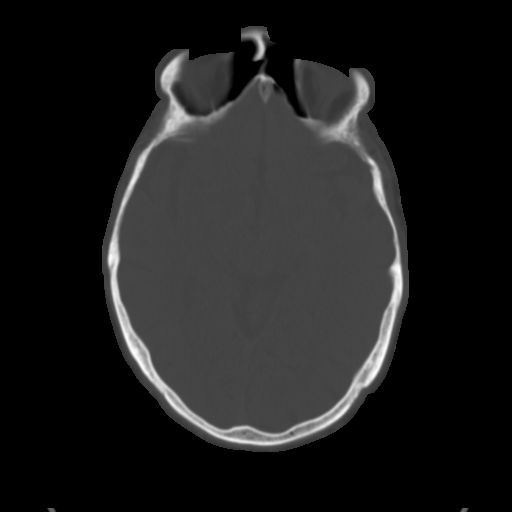
[im 20/36  brain]
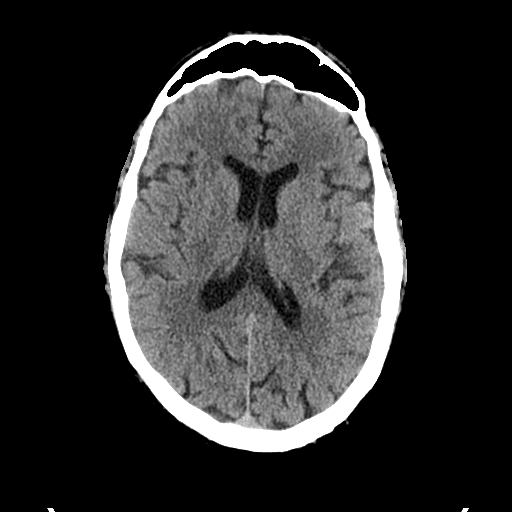
[im 23/36  brain]
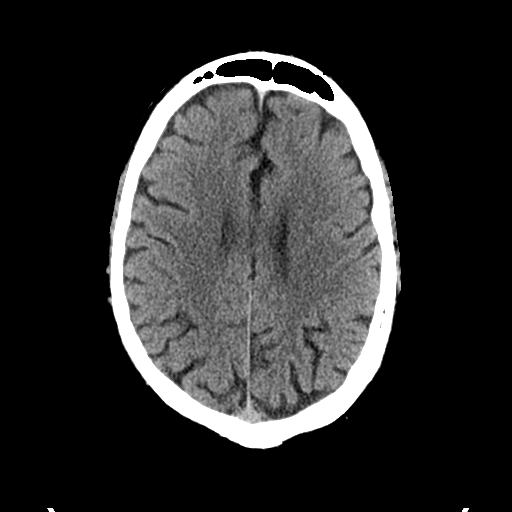
[im 27/36  brain]
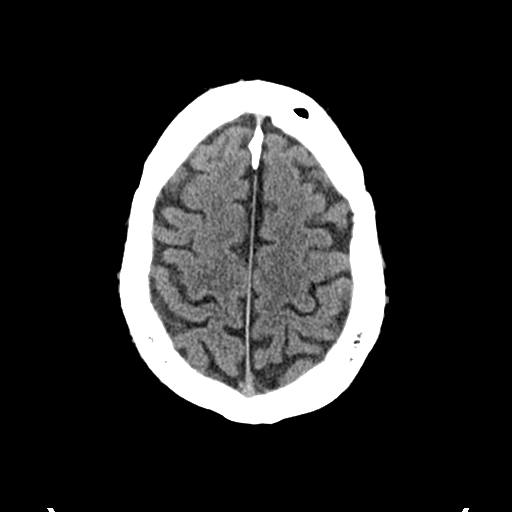
[im 29/36  brain]
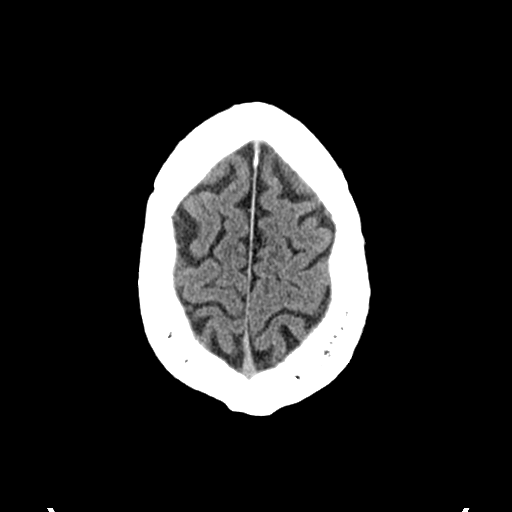
[im 29/36  bone]
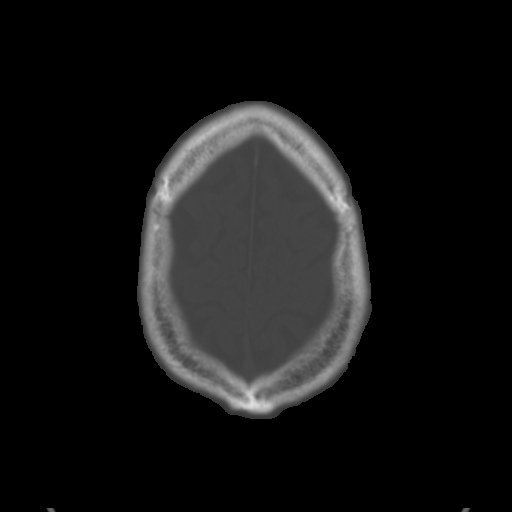
[im 33/36  brain]
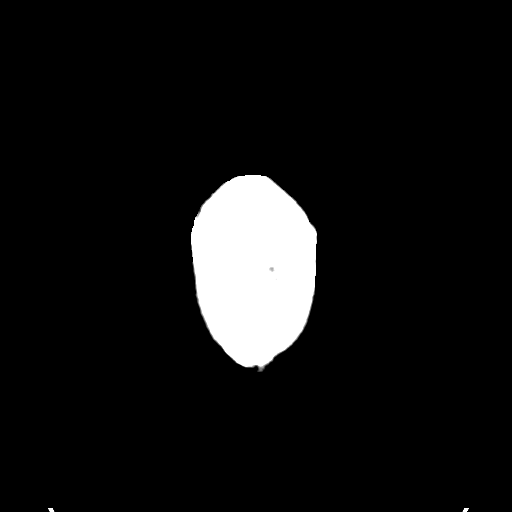

[Series 4: coronal soft tissue · coronal · 0.35mm/px · 3 of 79 slices shown]
[im 27/79  brain]
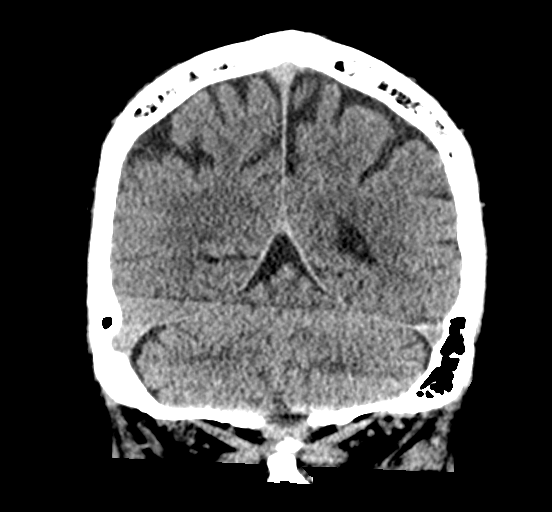
[im 35/79  brain]
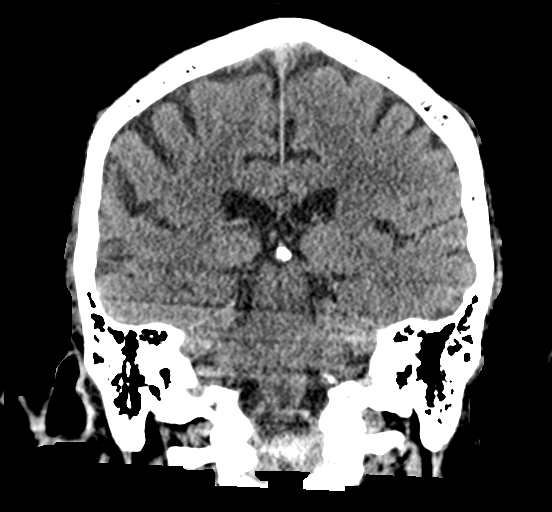
[im 44/79  brain]
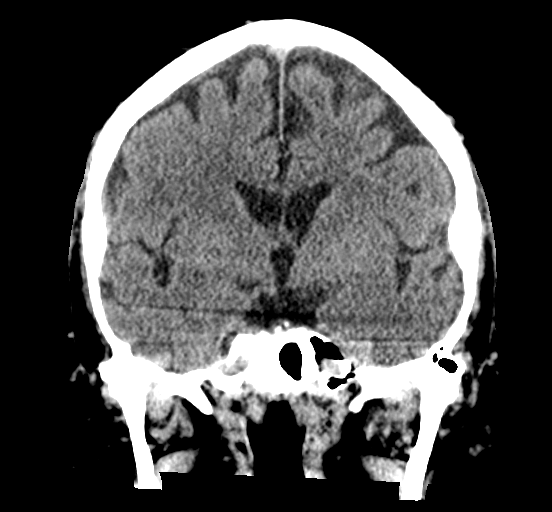

[Series 5: sagittal soft tissue · sagittal · 0.38mm/px · 3 of 63 slices shown]
[im 21/63  brain]
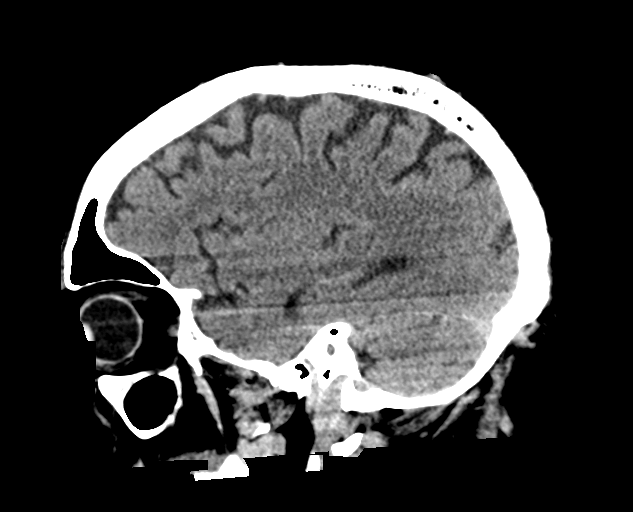
[im 32/63  brain]
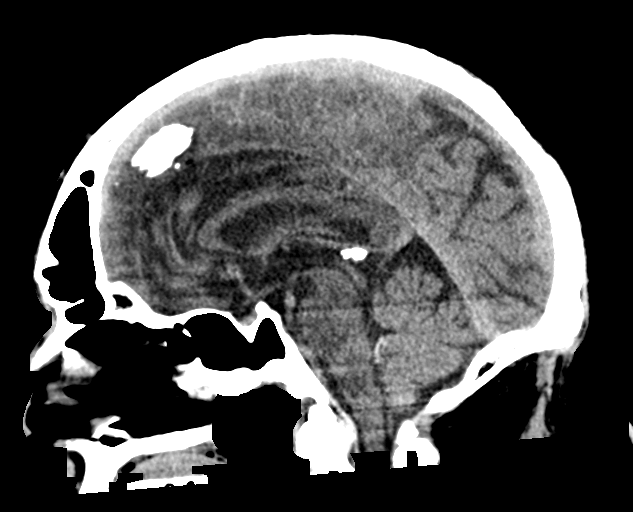
[im 42/63  brain]
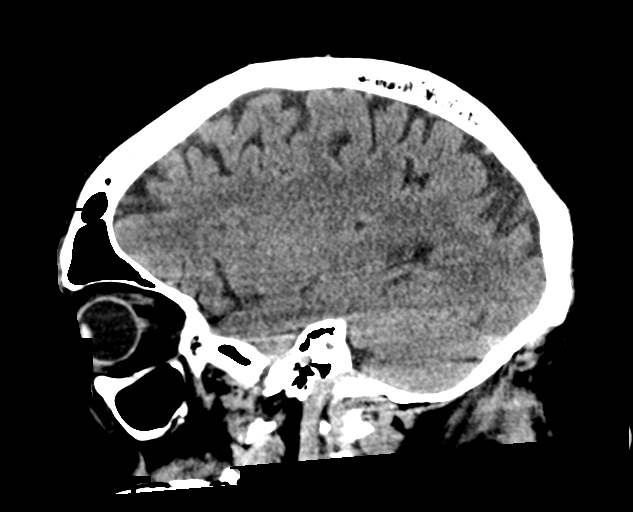

[16 of 47 positions shown; findings below may reference images not displayed]

FINDINGS: Brain: No subdural, epidural, or subarachnoid hemorrhage identified.
No mass effect or midline shift. Ventricles and sulci are
unremarkable. Cerebellum, brainstem, and basal cisterns are normal.
No acute cortical ischemia or infarct.

Vascular: Calcified atherosclerosis in the intracranial carotids.

Skull: Sclerosis is seen in the lateral calvarium as seen on series
3, image 48 no other abnormalities.

Sinuses/Orbits: Fluid in the inferior left mastoid air cells
identified without evidence of fracture or erosion. Mastoid air
cells and middle ears are otherwise unremarkable.

Other: None.
IMPRESSION: 1. No acute intracranial abnormalities identified.
2. Sclerosis seen in the lateral calvarium as seen on series 3,
image 48 is nonspecific. Recommend a bone scan as an outpatient for
further evaluation.

## 2020-11-26 NOTE — ED Provider Notes (Signed)
[facility] Emergency Department Provider Note  Time seen: 10:50 PM  I have reviewed the triage vital signs and the nursing notes.   HISTORY  Chief Complaint Numbness   HPI Douglas Lyons is a 75 y.o. male with a past medical history of diabetes, gastric reflux, hypertension, hyperlipidemia presents to the emergency department for bilateral arm weakness/numbness.  According to the patient approximately 1 to 2 weeks ago he developed tingling and numbness sensation in his left fingertips.  States that has continued to worsen and is now experiencing weakness of the left upper extremity and some mild numbness to left upper extremity.  Over the past several days he is now experiencing tingling and numbness to his right fingertips as well.  Patient states at times he feels unstable on his legs but cannot pinpoint a particular side.  Denies any fever cough congestion.  No vomiting diarrhea.  Denies any history of a diagnosis of neuropathy  Past Medical History:  Diagnosis Date  . Cataracts, bilateral   . Degenerative joint disease of knee, left   . Diabetes mellitus, type 2 ([place])   . Diverticulosis   . Erectile dysfunction   . GERD (gastroesophageal reflux disease)   . Heme positive stool   . Hyperlipidemia   . Hypertension   . Lumbar disc herniation   . Overweight (BMI 25.0-29.9)     There are no problems to display for this patient.   Past Surgical History:  Procedure Laterality Date  . COLONOSCOPY    . COLONOSCOPY N/A 07/26/2015   Procedure: COLONOSCOPY;  Surgeon: Manya Silvas, MD;  Location: [facility] ENDOSCOPY;  Service: Endoscopy;  Laterality: N/A;  . COLONOSCOPY N/A 09/29/2020   Procedure: COLONOSCOPY;  Surgeon: [name], MD;  Location: [facility] ENDOSCOPY;  Service: Endoscopy;  Laterality: N/A;  . SHOULDER ARTHROSCOPY Right   . VASECTOMY      Prior to Admission medications   Medication Sig Start Date End Date Taking? Authorizing Provider   aspirin EC 81 MG tablet Take 81 mg by mouth daily.    [provider]  enalapril (VASOTEC) 5 MG tablet Take 5 mg by mouth daily.    [provider]  glipiZIDE-metformin (METAGLIP) 2.5-500 MG tablet Take 1 tablet by mouth 2 (two) times daily before a meal.    [provider]  polyethylene glycol powder (GLYCOLAX/MIRALAX) powder Take 0.5 Containers by mouth once.    [provider]  rosuvastatin (CRESTOR) 20 MG tablet Take 20 mg by mouth daily.    [provider]    No Known Allergies  History reviewed. No pertinent family history.  Social History Social History   Tobacco Use  . Smoking status: Never Smoker  . Smokeless tobacco: Never Used  Vaping Use  . Vaping Use: Never used  Substance Use Topics  . Alcohol use: Yes    Comment: OCCASIONAL   . Drug use: Not Currently    Review of Systems Constitutional: Negative for fever. Cardiovascular: Negative for chest pain. Respiratory: Negative for shortness of breath. Gastrointestinal: Negative for abdominal pain, vomiting  Musculoskeletal: Negative for musculoskeletal complaints Neurological: Negative for headache.  Positive for bilateral arm weakness left greater than right. All other ROS negative  ____________________________________________   PHYSICAL EXAM:  VITAL SIGNS: ED Triage Vitals  Enc Vitals Group     BP 11/26/20 1845 (!) 175/93     Pulse Rate 11/26/20 1845 71     Resp 11/26/20 1845 18     Temp 11/26/20  1845 98.3 F (36.8 C)     Temp Source 11/26/20 1845 Oral     SpO2 11/26/20 1845 96 %     Weight 11/26/20 1846 211 lb (95.7 kg)     Height 11/26/20 1846 5\' 10"  (1.778 m)     Head Circumference --      Peak Flow --      Pain Score 11/26/20 1845 0     Pain Loc --      Pain Edu? --      Excl. in [place]? --     Constitutional: Alert and oriented. Well appearing and in no distress. Eyes: Normal exam ENT      Head: Normocephalic and atraumatic.      Mouth/Throat:  Mucous membranes are moist. Cardiovascular: Normal rate, regular rhythm. No murmur Respiratory: Normal respiratory effort without tachypnea nor retractions. Breath sounds are clear  Gastrointestinal: Soft and nontender. No distention. Musculoskeletal: Nontender with normal range of motion in all extremities. Neurologic:  Normal speech and language.  Patient has good grip strength bilaterally but is not able to fully extend the left upper extremity and has pronation of the left upper extremity on exam.  Has 5/5 motor in bilateral lower extremities with no drift noted.  Patient states subjective numbness/tingling to the right hand and fingertips and to the majority of the left arm. Skin:  Skin is warm, dry and intact.  Psychiatric: Mood and affect are normal.   ____________________________________________    EKG  EKG viewed and interpreted by myself shows a normal sinus rhythm at 72 bpm with a narrow QRS, normal axis, normal intervals, no concerning ST changes.  ____________________________________________    RADIOLOGY  CT scan of the head shows no acute abnormality.  ____________________________________________   INITIAL IMPRESSION / ASSESSMENT AND PLAN / ED COURSE  Pertinent labs & imaging results that were available during my care of the patient were reviewed by me and considered in my medical decision making (see chart for details).   Patient presents emergency department for 1 to 2 weeks of left upper extremity weakness and now some numbness and tingling in the right upper extremity.  Patient states he saw his doctor he states it could be a pinched nerve however the patient states that has progressively worsened is now he is having trouble extending the left arm and states worsening numbness that is extending proximally.  Patient's lab work today is within normal limits.  CT scan is largely normal.  Differential would include neuropathy, stroke, cervical cord pathology.  Given the  patient's findings on exam we will proceed with MR imaging of the brain and cervical spine to rule out abnormality.  If the MR imaging is negative I believe the patient could be discharged home with neurology follow-up for further evaluation.  MRI is pending at this time.  Patient care signed out to oncoming provider.  CAELIN RAYL was evaluated in Emergency Department on 11/26/2020 for the symptoms described in the history of present illness. He was evaluated in the context of the global COVID-19 pandemic, which necessitated consideration that the patient might be at risk for infection with the SARS-CoV-2 virus that causes COVID-19. Institutional protocols and algorithms that pertain to the evaluation of patients at risk for COVID-19 are in a state of rapid change based on information released by regulatory bodies including the CDC and federal and state organizations. These policies and algorithms were followed during the patient's care in the ED.  ____________________________________________   FINAL CLINICAL  IMPRESSION(S) / ED DIAGNOSES  Arm weakness   Harvest Dark, MD 11/26/20 2255

## 2020-11-26 NOTE — ED Triage Notes (Addendum)
Pt via POV from home. Pt c/o bilateral hand numbness for the past 2 weeks. Pt states that he started becoming weaker than normal for about a week and a half. No facial palsy noted. Some mild drift on the L upper extremity. No drift in the lower extremity. Denies headache. Denies vision changes. Pt is A&Ox4 and NAD.

## 2020-11-26 NOTE — ED Provider Notes (Addendum)
  Physical Exam  BP (!) 185/75   Pulse 63   Temp 98.4 F (36.9 C) (Oral)   Resp 15   Ht 5\' 10"  (1.778 m)   Wt 95.7 kg   SpO2 97%   BMI 30.28 kg/m   Physical Exam  ED Course/Procedures     Procedures  MDM  11:50 PM  Assumed care.  Patient with 2 weeks L hand numbness, progressively worsening and now in R hand, now weak in L arm, MRI brain and C spine pending.   2:20 AM  Pt's brain MRI shows no acute intracranial abnormality.  He does have a sclerotic lesion of the left calvarium that was seen on previous head CT.  Have recommended nonemergent outpatient bone scan for this.  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  Suspect that this is the cause of his symptoms.  On my reevaluation, patient states that he has been having left-sided hand numbness in all 5 fingers for the past 4 weeks.  He states about a week ago he started having numbness in the right hand and then weakness in the left arm.  Also reports that he feels like he is having a hard time walking because he feels like his legs are going to give out.  No urinary retention, bowel or bladder incontinence.  No previous neck surgeries, epidural injections.  On my exam, patient has diminished sensation in both hands diffusely and diminished grip strength in the left hand but otherwise normal sensation diffusely, normal strength, normal deep tendon reflexes, no hyperreflexia.  Will discuss with neurosurgery on-call.   2:30 AM  Spoke with Dr. Catalina Pizza on call for neurosurgery.  He states his colleague Dr. Lacinda Axon will see the patient in the ED in the morning to determine disposition as patient does not need surgery emergently.  We will keep patient n.p.o. at this time and obtain Covid swab in case patient does need admission for surgical intervention.    Family updated  with plan.  Wife plans to go home to sleep.  Patient does state that he is concerned because he is having a difficult time walking and is hoping to be admitted to the hospital.  Disposition will be per neurosurgery.  Will sign out to oncoming ED physician in the morning.   Oneal Biglow, Delice Bison, DO 11/27/20 0325   5:03 AM  NS[facility] has reviewed patient's MRI and requests CT of the cervical spine.   Tripp Goins, Delice Bison, DO 11/27/20 [phone]

## 2020-11-27 ENCOUNTER — Emergency Department: Payer: Medicare Other

## 2020-11-27 DIAGNOSIS — E785 Hyperlipidemia, unspecified: Secondary | ICD-10-CM | POA: Diagnosis present

## 2020-11-27 DIAGNOSIS — R2681 Unsteadiness on feet: Secondary | ICD-10-CM | POA: Diagnosis present

## 2020-11-27 DIAGNOSIS — M1712 Unilateral primary osteoarthritis, left knee: Secondary | ICD-10-CM | POA: Diagnosis present

## 2020-11-27 DIAGNOSIS — E119 Type 2 diabetes mellitus without complications: Secondary | ICD-10-CM

## 2020-11-27 DIAGNOSIS — Z7984 Long term (current) use of oral hypoglycemic drugs: Secondary | ICD-10-CM | POA: Diagnosis not present

## 2020-11-27 DIAGNOSIS — Z6830 Body mass index (BMI) 30.0-30.9, adult: Secondary | ICD-10-CM | POA: Diagnosis not present

## 2020-11-27 DIAGNOSIS — M4802 Spinal stenosis, cervical region: Principal | ICD-10-CM

## 2020-11-27 DIAGNOSIS — K219 Gastro-esophageal reflux disease without esophagitis: Secondary | ICD-10-CM | POA: Diagnosis present

## 2020-11-27 DIAGNOSIS — M4712 Other spondylosis with myelopathy, cervical region: Secondary | ICD-10-CM | POA: Diagnosis present

## 2020-11-27 DIAGNOSIS — M5001 Cervical disc disorder with myelopathy,  high cervical region: Secondary | ICD-10-CM | POA: Diagnosis present

## 2020-11-27 DIAGNOSIS — M5126 Other intervertebral disc displacement, lumbar region: Secondary | ICD-10-CM | POA: Diagnosis present

## 2020-11-27 DIAGNOSIS — K5790 Diverticulosis of intestine, part unspecified, without perforation or abscess without bleeding: Secondary | ICD-10-CM | POA: Diagnosis present

## 2020-11-27 DIAGNOSIS — M50021 Cervical disc disorder at C4-C5 level with myelopathy: Secondary | ICD-10-CM | POA: Diagnosis present

## 2020-11-27 DIAGNOSIS — I10 Essential (primary) hypertension: Secondary | ICD-10-CM | POA: Diagnosis present

## 2020-11-27 DIAGNOSIS — M5011 Cervical disc disorder with radiculopathy,  high cervical region: Secondary | ICD-10-CM | POA: Diagnosis present

## 2020-11-27 DIAGNOSIS — Z79899 Other long term (current) drug therapy: Secondary | ICD-10-CM | POA: Diagnosis not present

## 2020-11-27 DIAGNOSIS — M4722 Other spondylosis with radiculopathy, cervical region: Secondary | ICD-10-CM | POA: Diagnosis present

## 2020-11-27 DIAGNOSIS — Z9852 Vasectomy status: Secondary | ICD-10-CM | POA: Diagnosis not present

## 2020-11-27 DIAGNOSIS — R1310 Dysphagia, unspecified: Secondary | ICD-10-CM | POA: Diagnosis not present

## 2020-11-27 DIAGNOSIS — R609 Edema, unspecified: Secondary | ICD-10-CM | POA: Diagnosis not present

## 2020-11-27 DIAGNOSIS — Z7982 Long term (current) use of aspirin: Secondary | ICD-10-CM | POA: Diagnosis not present

## 2020-11-27 DIAGNOSIS — G992 Myelopathy in diseases classified elsewhere: Secondary | ICD-10-CM | POA: Diagnosis not present

## 2020-11-27 DIAGNOSIS — Z20822 Contact with and (suspected) exposure to covid-19: Secondary | ICD-10-CM | POA: Diagnosis present

## 2020-11-27 DIAGNOSIS — M2578 Osteophyte, vertebrae: Secondary | ICD-10-CM | POA: Diagnosis present

## 2020-11-27 DIAGNOSIS — R202 Paresthesia of skin: Secondary | ICD-10-CM | POA: Diagnosis present

## 2020-11-27 DIAGNOSIS — E669 Obesity, unspecified: Secondary | ICD-10-CM | POA: Diagnosis present

## 2020-11-27 LAB — GLUCOSE, CAPILLARY
Glucose-Capillary: 184 mg/dL — ABNORMAL HIGH (ref 70–99)
Glucose-Capillary: 151 mg/dL — ABNORMAL HIGH (ref 70–99)
Glucose-Capillary: 159 mg/dL — ABNORMAL HIGH (ref 70–99)

## 2020-11-27 LAB — TYPE AND SCREEN
ABO/RH(D): O NEG
Antibody Screen: NEGATIVE

## 2020-11-27 LAB — RESP PANEL BY RT-PCR (FLU A&B, COVID) ARPGX2
Influenza A by PCR: NEGATIVE
Influenza B by PCR: NEGATIVE
SARS Coronavirus 2 by RT PCR: NEGATIVE

## 2020-11-27 LAB — HEMOGLOBIN A1C
Hgb A1c MFr Bld: 6.6 % — ABNORMAL HIGH (ref 4.8–5.6)
Mean Plasma Glucose: 142.72 mg/dL

## 2020-11-27 IMAGING — MR MR HEAD W/O CM
10 of 11 series · 35 of 48 positions shown · non-contrast
Comparison: Prior CT from [DATE]

CLINICAL DATA: Initial evaluation for neuro deficit, stroke
suspected. Bilateral arm weakness and numbness.

EXAM:
MRI HEAD WITHOUT CONTRAST
MRI CERVICAL SPINE WITHOUT CONTRAST
TECHNIQUE: Multiplanar, multiecho pulse sequences of the brain and surrounding
structures, and cervical spine, to include the craniocervical
junction and cervicothoracic junction, were obtained without
intravenous contrast.

[Series 5: ax dwi_tracew · axial · 3.0mm · 0.65mm/px · z∈[-32,+120]mm · 5 of 48 slices shown]
[im 1/48]
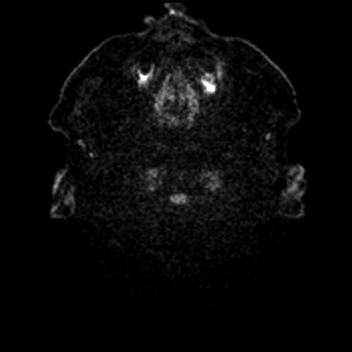
[im 12/48]
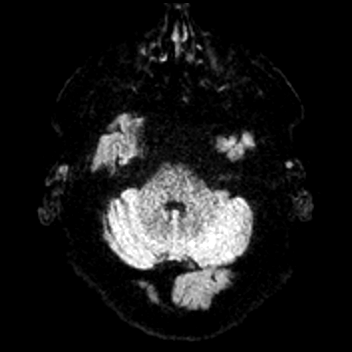
[im 24/48]
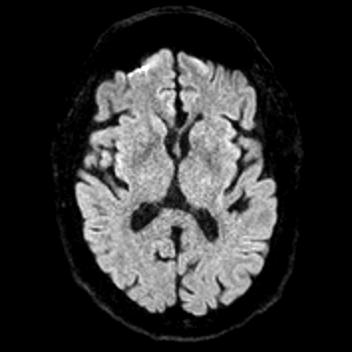
[im 36/48]
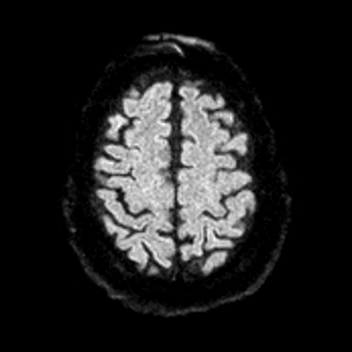
[im 48/48]
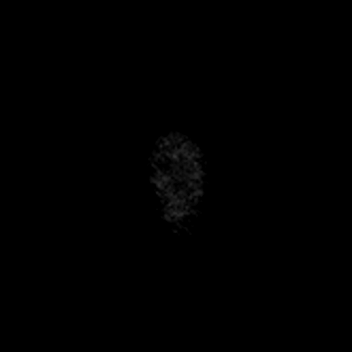

[Series 6: ax dwi_adc · axial · 3.0mm · 0.65mm/px · z∈[-32,+120]mm · 4 of 48 slices shown]
[im 1/48]
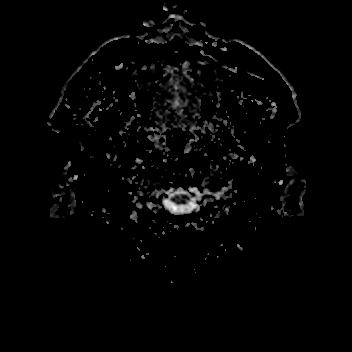
[im 16/48]
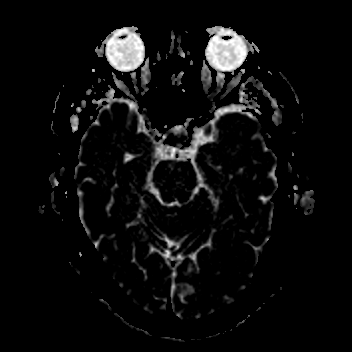
[im 32/48]
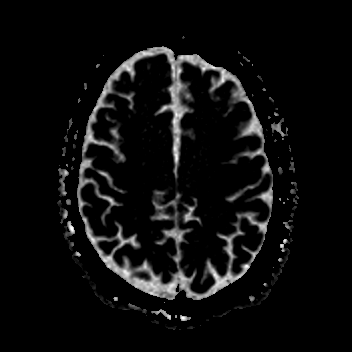
[im 48/48]
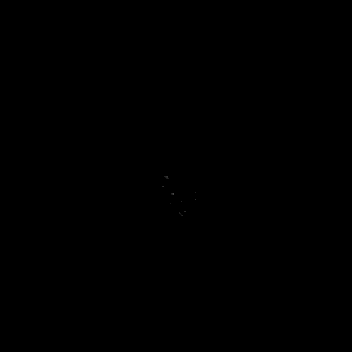

[Series 7: cor dwi_tracew · coronal · 5.0mm · 0.65mm/px · 3 of 40 slices shown]
[im 1/40]
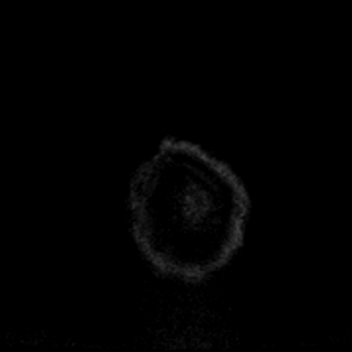
[im 20/40]
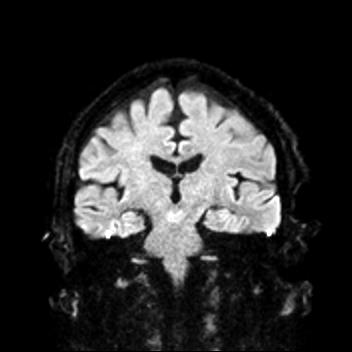
[im 40/40]
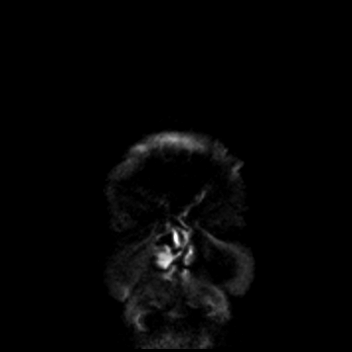

[Series 8: cor dwi_adc · coronal · 5.0mm · 0.65mm/px · 3 of 40 slices shown]
[im 1/40]
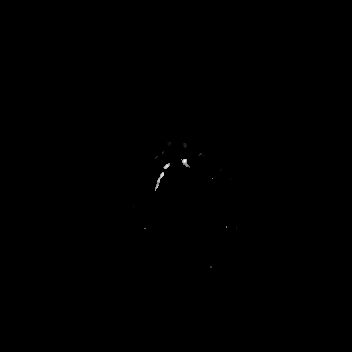
[im 20/40]
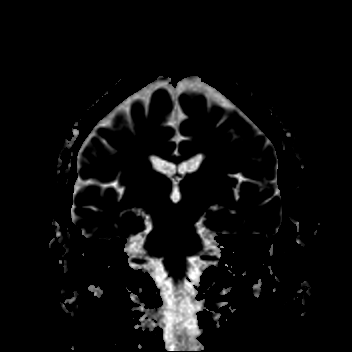
[im 40/40]
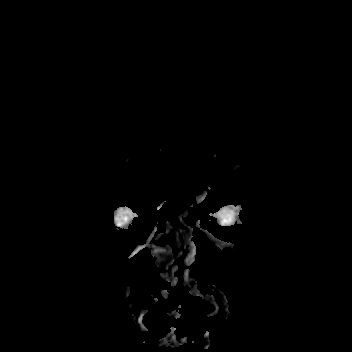

[Series 9: T1 · sagittal · 5.0mm · 0.62mm/px · 2 of 23 slices shown (1 of 2)]
[im 1/23]
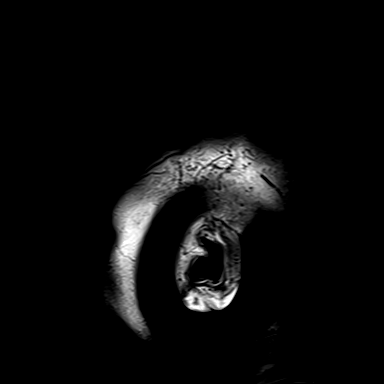
[im 23/23]
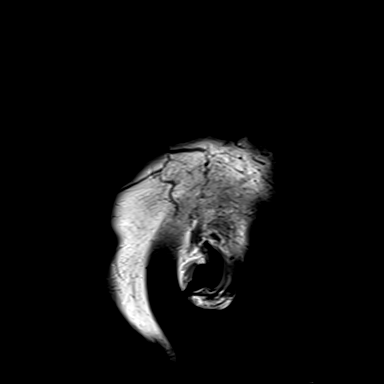

[Series 10: T2 · axial · 5.0mm · 0.45mm/px · z∈[-32,+120]mm · 2 of 27 slices shown (1 of 2)]
[im 1/27]
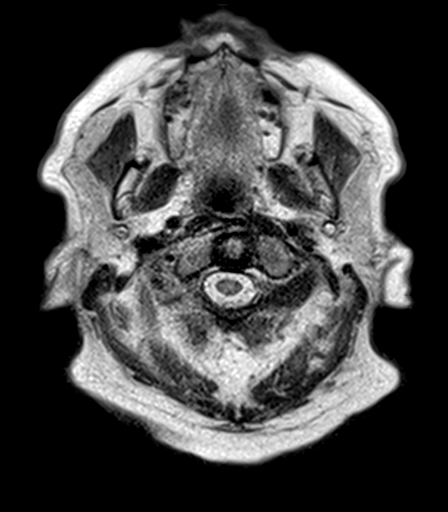
[im 27/27]
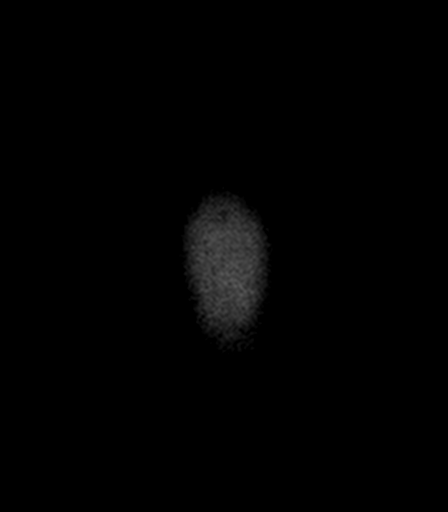

[Series 12: pha_images · axial · 3.0mm · 0.90mm/px · z∈[-37,+42]mm · 3 of 55 slices shown]
[im 1/55]
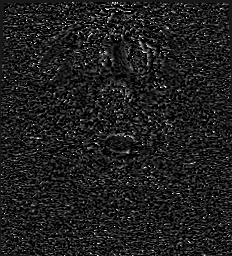
[im 14/55]
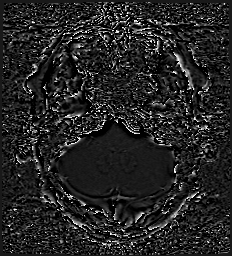
[im 28/55]
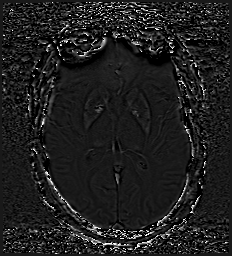

[Series 15: FLAIR · axial · 5.0mm · 1.20mm/px · z∈[-32,+120]mm · 2 of 27 slices shown]
[im 1/27]
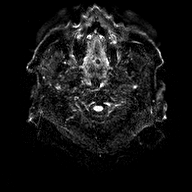
[im 27/27]
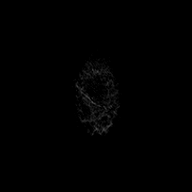

[Series 16: T1 · axial · 1.0mm · 0.98mm/px · z∈[-32,+124]mm · 8 of 160 slices shown (2 of 2)]
[im 1/160]
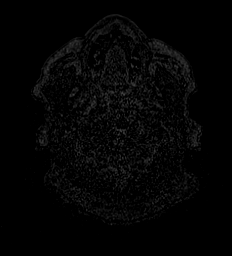
[im 25/160]
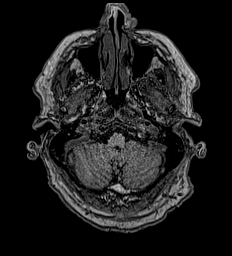
[im 49/160]
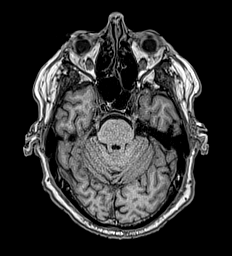
[im 74/160]
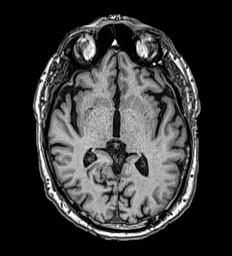
[im 86/160]
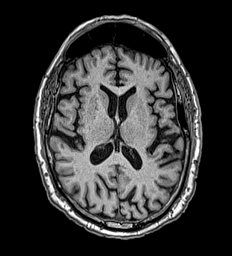
[im 111/160]
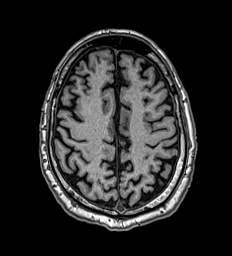
[im 135/160]
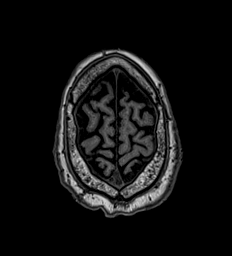
[im 160/160]
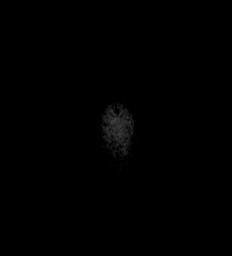

[Series 17: T2 · coronal · 5.0mm · 0.45mm/px · 3 of 33 slices shown (2 of 2)]
[im 1/33]
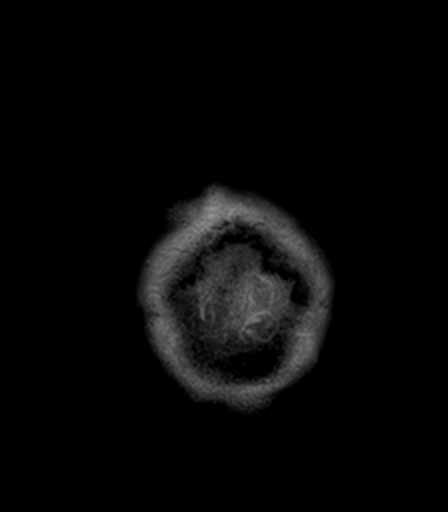
[im 17/33]
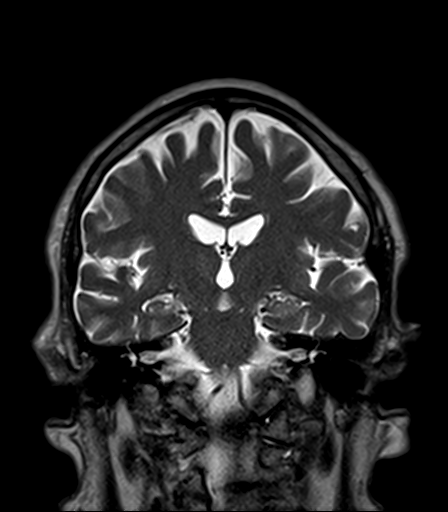
[im 33/33]
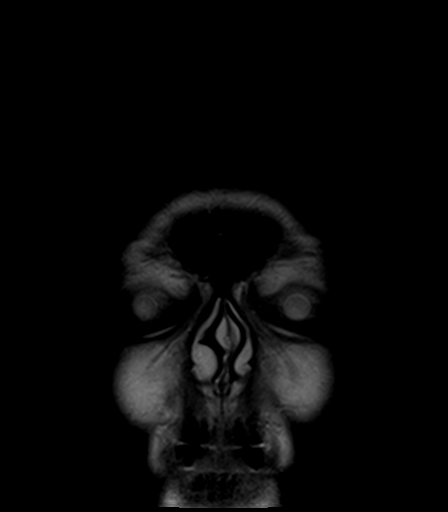

[35 of 48 positions shown; findings below may reference images not displayed]

FINDINGS: MRI HEAD FINDINGS

Brain: Generalized age-related cerebral atrophy. No significant
cerebral white matter disease for age. No abnormal foci of
restricted diffusion to suggest acute or subacute ischemia.
Gray-white matter differentiation maintained. No encephalomalacia to
suggest chronic cortical infarction. No foci of susceptibility
artifact to suggest acute or chronic intracranial hemorrhage.

No mass lesion, midline shift or mass effect. No hydrocephalus or
extra-axial fluid collection. Pituitary gland suprasellar region
within normal limits. Midline structures intact.

Vascular: Major intracranial vascular flow voids are maintained at
the skull base.

Skull and upper cervical spine: Craniocervical junction within
normal limits. Bone marrow signal intensity normal. Note made of a
2.7 cm T2 hypointense sclerotic lesion at the left calvarium, also
seen on prior CT. No other focal marrow replacing lesion.

Sinuses/Orbits: Globes and orbital soft tissues demonstrate no acute
finding. Paranasal sinuses are clear. Small bilateral mastoid
effusions noted, of doubtful significance. Inner ear structures
grossly normal. Visualized nasopharynx within normal limits.

Other: None.

MRI CERVICAL SPINE FINDINGS

Alignment: Vertebral bodies normally aligned with preservation of
the normal cervical lordosis. No listhesis.

Vertebrae: Vertebral body height maintained without acute or chronic
fracture. Bone marrow signal intensity within normal limits. No
discrete or worrisome osseous lesions. Discogenic reactive endplate
change noted about the C3-4 interspace. Reactive marrow edema noted
about the right C3-4 facet as well due to facet arthritis. No other
abnormal marrow edema.

Cord: Patchy signal abnormality seen involving the left greater than
right cord at the level of C3-4, consistent with compressive
myelopathy (series 10, image 10). Signal intensity within the
cervical spinal cord otherwise within normal limits.

Posterior Fossa, vertebral arteries, paraspinal tissues:
Craniocervical junction normal. Paraspinous and prevertebral soft
tissues normal. Normal flow voids seen within the vertebral arteries
bilaterally.

Disc levels:

C2-C3: Disc bulge with bilateral uncovertebral hypertrophy. No
spinal stenosis. Moderate left C3 foraminal narrowing. Right neural
foramen remains patent.

C3-C4: Degenerative intervertebral disc space narrowing with diffuse
disc osteophyte complex. Broad posterior component indents and
effaces the ventral thecal sac, contacting and flattening the
cervical spinal cord. Associated cord signal changes consistent with
myelopathy. Superimposed severe right-sided facet degeneration.
Resultant severe spinal stenosis with the thecal sac measuring 3 mm
in AP diameter at its most narrow point. Severe bilateral C4
foraminal narrowing.

C4-C5: Degenerative intervertebral disc space narrowing with diffuse
disc osteophyte complex. Broad central to left paracentral posterior
component indents and partially faces the ventral thecal sac (series
10, image 14). Well cord flattening without cord signal changes.
Moderate spinal stenosis. Severe left worse than right C5 foraminal
stenosis.

C5-C6: Diffuse disc bulge with bilateral uncovertebral hypertrophy.
Right worse than left facet degeneration. Resultant mild to moderate
spinal stenosis without cord impingement. Severe right worse than
left C6 foraminal narrowing.

C6-C7: Diffuse disc bulge with bilateral uncovertebral hypertrophy.
Flattening of the ventral thecal sac with resultant mild spinal
stenosis. No cord impingement. Severe left with moderate right C7
foraminal stenosis.

C7-T1: Negative interspace. Left worse than right facet
degeneration. No spinal stenosis. Foramina remain patent.

Visualized upper thoracic spine demonstrates no significant finding.
IMPRESSION: MRI HEAD IMPRESSION:

1. Negative brain MRI for age. No acute intracranial abnormality
identified.
2. 2.7 cm sclerotic lesion involving the left calvarium, also seen
on prior head CT. Again, further evaluation with nonemergent
outpatient bone scan recommended for further evaluation.

MRI CERVICAL SPINE IMPRESSION:

1. Degenerative disc osteophyte at C3-4 with resultant severe spinal
stenosis and flattening of the cervical spinal cord. Associated cord
signal abnormality consistent with compressive myelopathy.
2. Additional degenerative spondylosis at C4-5 and C5-6 with
resultant moderate spinal stenosis.
3. Multifactorial degenerative changes with resultant multilevel
foraminal narrowing as above. Notable findings include moderate left
C3 foraminal stenosis, with severe bilateral C4, C5, C6, and left C7
foraminal narrowing.
4. Reactive marrow edema about the right C3-4 facet due to facet
arthritis. Finding could serve as a source for neck pain.

## 2020-11-27 IMAGING — MR MR CERVICAL SPINE W/O CM
5 series · 34 of 48 positions shown · non-contrast
Comparison: Prior CT from [DATE]

CLINICAL DATA: Initial evaluation for neuro deficit, stroke
suspected. Bilateral arm weakness and numbness.

EXAM:
MRI HEAD WITHOUT CONTRAST
MRI CERVICAL SPINE WITHOUT CONTRAST
TECHNIQUE: Multiplanar, multiecho pulse sequences of the brain and surrounding
structures, and cervical spine, to include the craniocervical
junction and cervicothoracic junction, were obtained without
intravenous contrast.

[Series 7: T2 · sagittal · 3.0mm · 0.62mm/px · 6 of 15 slices shown (1 of 2)]
[im 1/15]
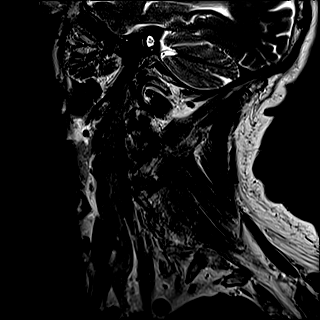
[im 3/15]
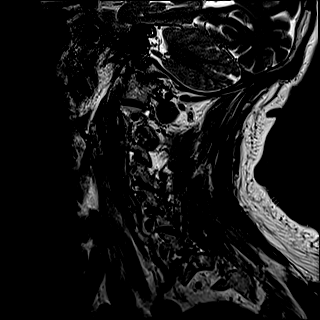
[im 6/15]
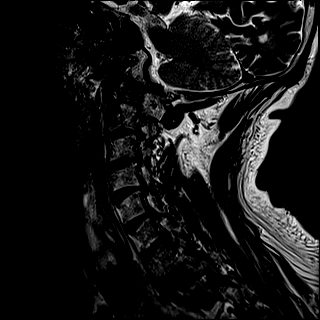
[im 9/15]
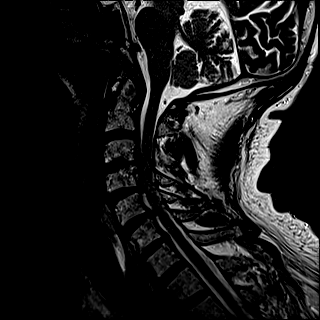
[im 12/15]
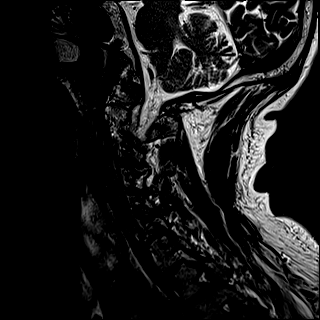
[im 15/15]
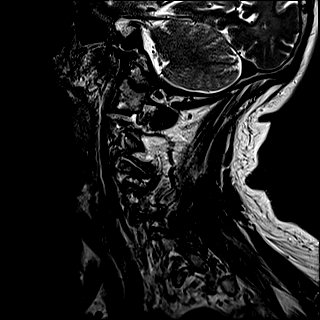

[Series 8: FLAIR · sagittal · 3.0mm · 0.78mm/px · 7 of 15 slices shown]
[im 1/15]
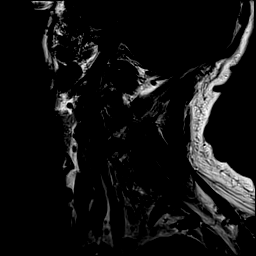
[im 3/15]
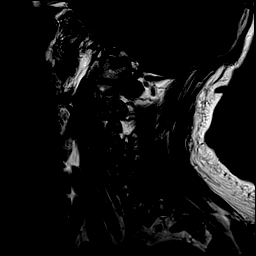
[im 5/15]
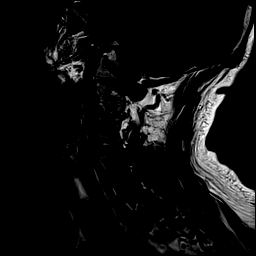
[im 8/15]
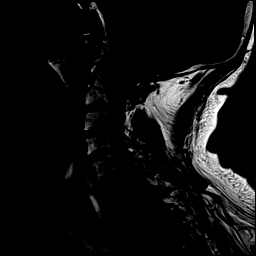
[im 10/15]
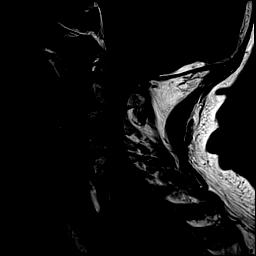
[im 12/15]
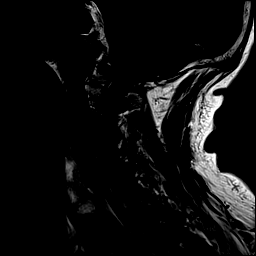
[im 15/15]
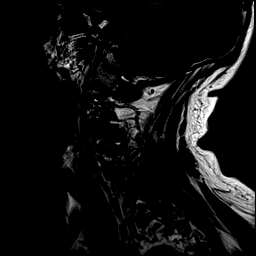

[Series 9: STIR · sagittal · 3.0mm · 0.62mm/px · 7 of 15 slices shown]
[im 1/15]
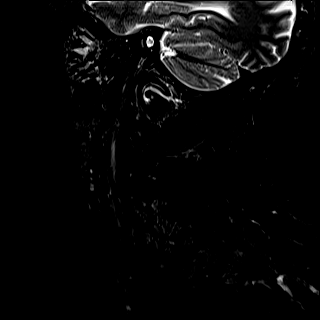
[im 3/15]
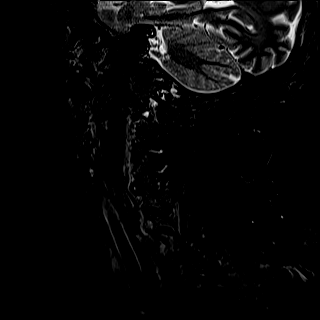
[im 5/15]
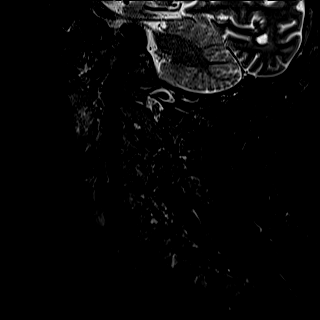
[im 8/15]
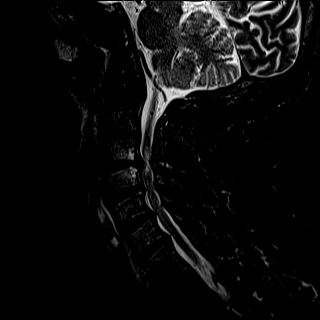
[im 10/15]
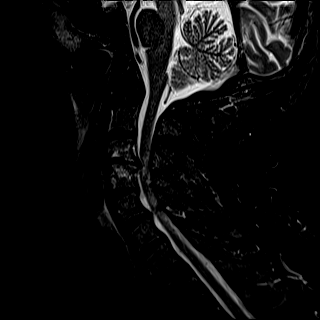
[im 12/15]
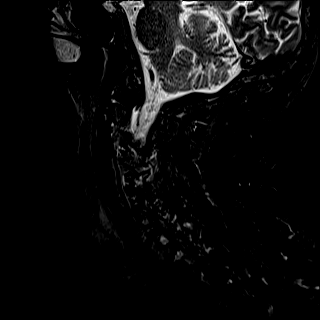
[im 15/15]
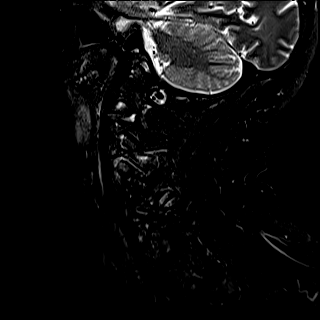

[Series 10: T2 · axial · 3.0mm · 0.70mm/px · z∈[-202,-100]mm · 8 of 32 slices shown (2 of 2)]
[im 1/32]
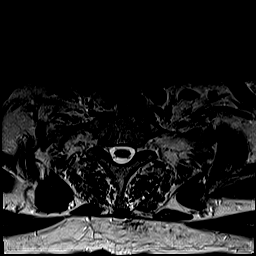
[im 5/32]
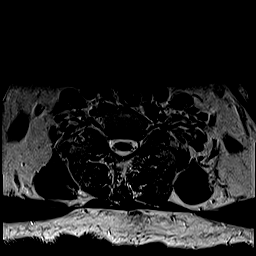
[im 10/32]
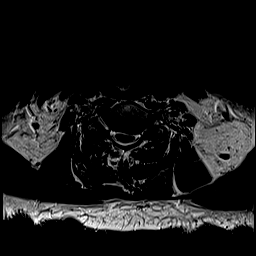
[im 15/32]
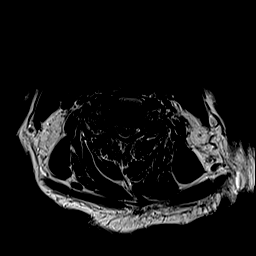
[im 17/32]
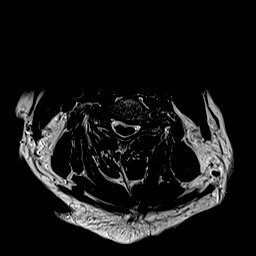
[im 22/32]
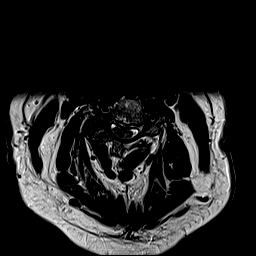
[im 27/32]
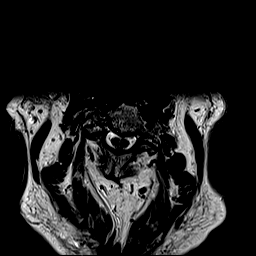
[im 32/32]
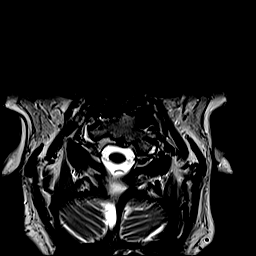

[Series 11: csp ax mpgr · axial · 3.0mm · 0.35mm/px · z∈[-202,-132]mm · 6 of 32 slices shown]
[im 1/32]
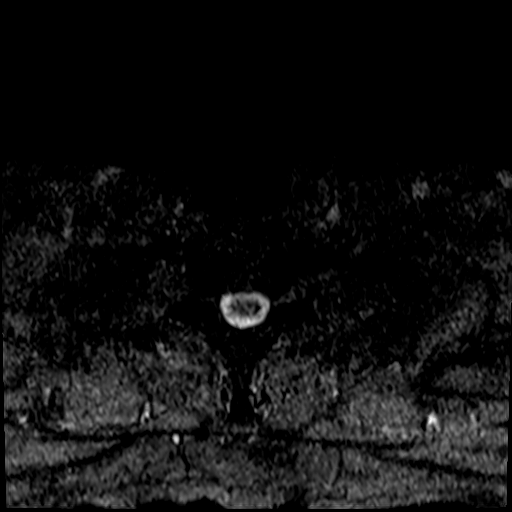
[im 5/32]
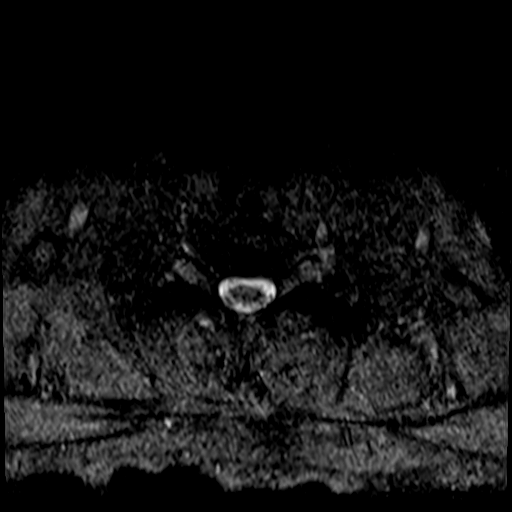
[im 10/32]
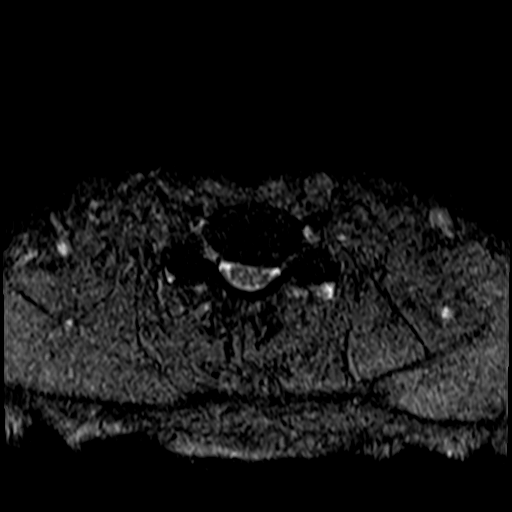
[im 15/32]
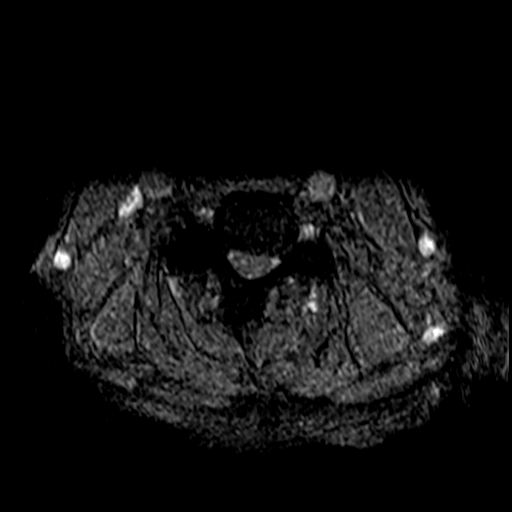
[im 17/32]
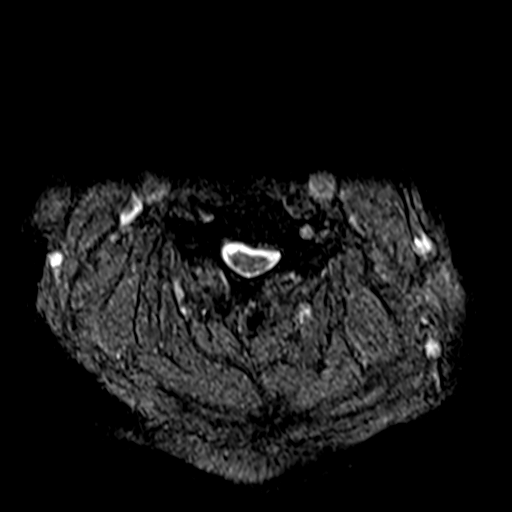
[im 22/32]
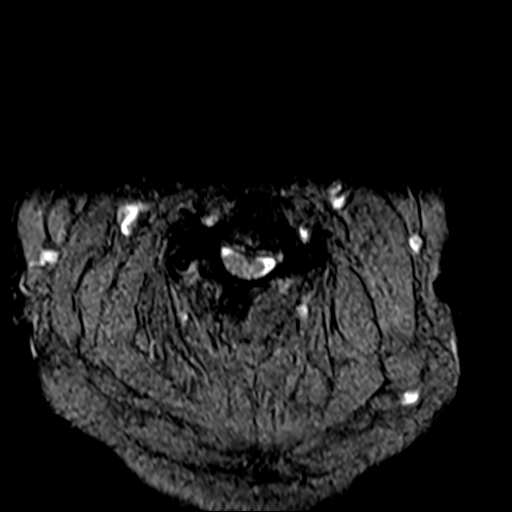

[34 of 48 positions shown; findings below may reference images not displayed]

FINDINGS: MRI HEAD FINDINGS

Brain: Generalized age-related cerebral atrophy. No significant
cerebral white matter disease for age. No abnormal foci of
restricted diffusion to suggest acute or subacute ischemia.
Gray-white matter differentiation maintained. No encephalomalacia to
suggest chronic cortical infarction. No foci of susceptibility
artifact to suggest acute or chronic intracranial hemorrhage.

No mass lesion, midline shift or mass effect. No hydrocephalus or
extra-axial fluid collection. Pituitary gland suprasellar region
within normal limits. Midline structures intact.

Vascular: Major intracranial vascular flow voids are maintained at
the skull base.

Skull and upper cervical spine: Craniocervical junction within
normal limits. Bone marrow signal intensity normal. Note made of a
2.7 cm T2 hypointense sclerotic lesion at the left calvarium, also
seen on prior CT. No other focal marrow replacing lesion.

Sinuses/Orbits: Globes and orbital soft tissues demonstrate no acute
finding. Paranasal sinuses are clear. Small bilateral mastoid
effusions noted, of doubtful significance. Inner ear structures
grossly normal. Visualized nasopharynx within normal limits.

Other: None.

MRI CERVICAL SPINE FINDINGS

Alignment: Vertebral bodies normally aligned with preservation of
the normal cervical lordosis. No listhesis.

Vertebrae: Vertebral body height maintained without acute or chronic
fracture. Bone marrow signal intensity within normal limits. No
discrete or worrisome osseous lesions. Discogenic reactive endplate
change noted about the C3-4 interspace. Reactive marrow edema noted
about the right C3-4 facet as well due to facet arthritis. No other
abnormal marrow edema.

Cord: Patchy signal abnormality seen involving the left greater than
right cord at the level of C3-4, consistent with compressive
myelopathy (series 10, image 10). Signal intensity within the
cervical spinal cord otherwise within normal limits.

Posterior Fossa, vertebral arteries, paraspinal tissues:
Craniocervical junction normal. Paraspinous and prevertebral soft
tissues normal. Normal flow voids seen within the vertebral arteries
bilaterally.

Disc levels:

C2-C3: Disc bulge with bilateral uncovertebral hypertrophy. No
spinal stenosis. Moderate left C3 foraminal narrowing. Right neural
foramen remains patent.

C3-C4: Degenerative intervertebral disc space narrowing with diffuse
disc osteophyte complex. Broad posterior component indents and
effaces the ventral thecal sac, contacting and flattening the
cervical spinal cord. Associated cord signal changes consistent with
myelopathy. Superimposed severe right-sided facet degeneration.
Resultant severe spinal stenosis with the thecal sac measuring 3 mm
in AP diameter at its most narrow point. Severe bilateral C4
foraminal narrowing.

C4-C5: Degenerative intervertebral disc space narrowing with diffuse
disc osteophyte complex. Broad central to left paracentral posterior
component indents and partially faces the ventral thecal sac (series
10, image 14). Well cord flattening without cord signal changes.
Moderate spinal stenosis. Severe left worse than right C5 foraminal
stenosis.

C5-C6: Diffuse disc bulge with bilateral uncovertebral hypertrophy.
Right worse than left facet degeneration. Resultant mild to moderate
spinal stenosis without cord impingement. Severe right worse than
left C6 foraminal narrowing.

C6-C7: Diffuse disc bulge with bilateral uncovertebral hypertrophy.
Flattening of the ventral thecal sac with resultant mild spinal
stenosis. No cord impingement. Severe left with moderate right C7
foraminal stenosis.

C7-T1: Negative interspace. Left worse than right facet
degeneration. No spinal stenosis. Foramina remain patent.

Visualized upper thoracic spine demonstrates no significant finding.
IMPRESSION: MRI HEAD IMPRESSION:

1. Negative brain MRI for age. No acute intracranial abnormality
identified.
2. 2.7 cm sclerotic lesion involving the left calvarium, also seen
on prior head CT. Again, further evaluation with nonemergent
outpatient bone scan recommended for further evaluation.

MRI CERVICAL SPINE IMPRESSION:

1. Degenerative disc osteophyte at C3-4 with resultant severe spinal
stenosis and flattening of the cervical spinal cord. Associated cord
signal abnormality consistent with compressive myelopathy.
2. Additional degenerative spondylosis at C4-5 and C5-6 with
resultant moderate spinal stenosis.
3. Multifactorial degenerative changes with resultant multilevel
foraminal narrowing as above. Notable findings include moderate left
C3 foraminal stenosis, with severe bilateral C4, C5, C6, and left C7
foraminal narrowing.
4. Reactive marrow edema about the right C3-4 facet due to facet
arthritis. Finding could serve as a source for neck pain.

## 2020-11-27 IMAGING — CT CT CERVICAL SPINE W/O CM
3 of 4 series · 10 of 33 positions shown, 12 images · non-contrast
Comparison: MRI cervical spine obtained earlier today

CLINICAL DATA: Spinal stenosis

EXAM:
CT CERVICAL SPINE WITHOUT CONTRAST
TECHNIQUE: Multidetector CT imaging of the cervical spine was performed without
intravenous contrast. Multiplanar CT image reconstructions were also
generated.

[Series 4: sagittal bone · sagittal · 0.41mm/px · 5 of 96 slices shown, 6 images]
[im 32/96  bone]
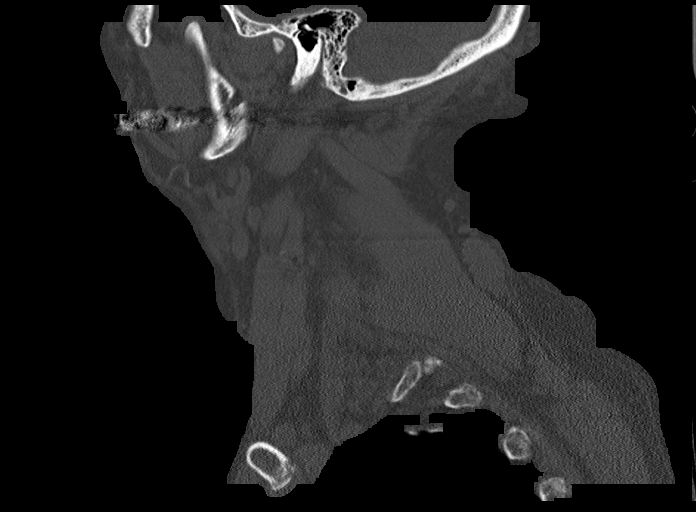
[im 40/96  bone]
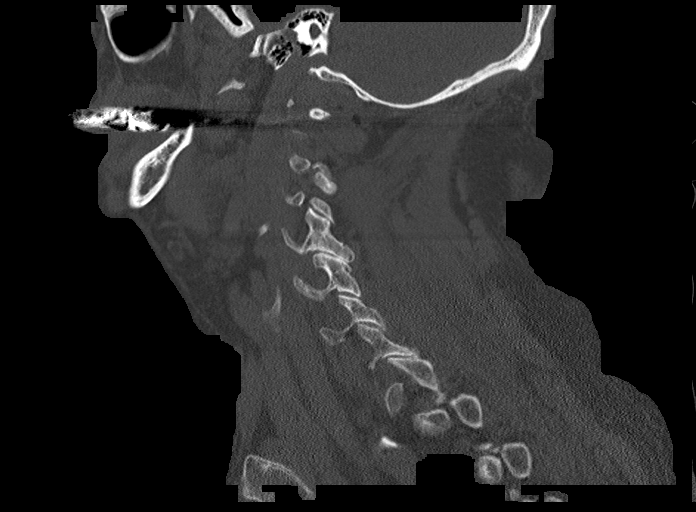
[im 48/96  soft-tissue]
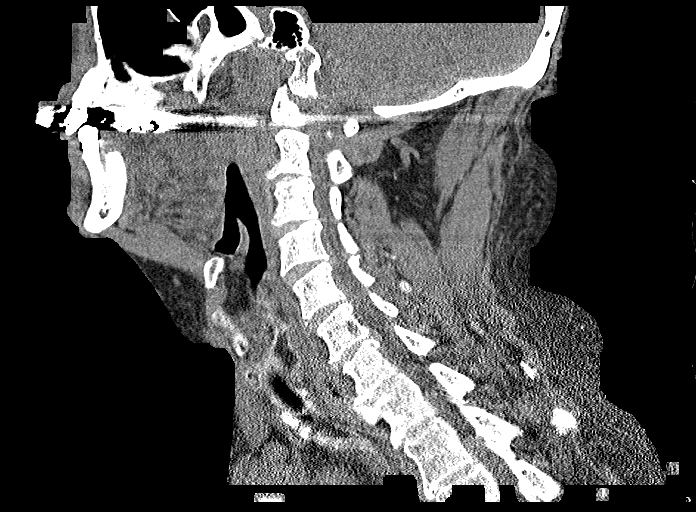
[im 48/96  bone]
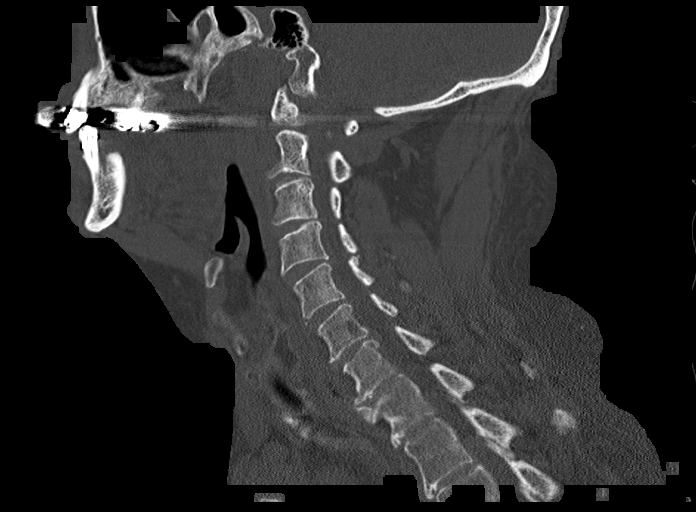
[im 56/96  bone]
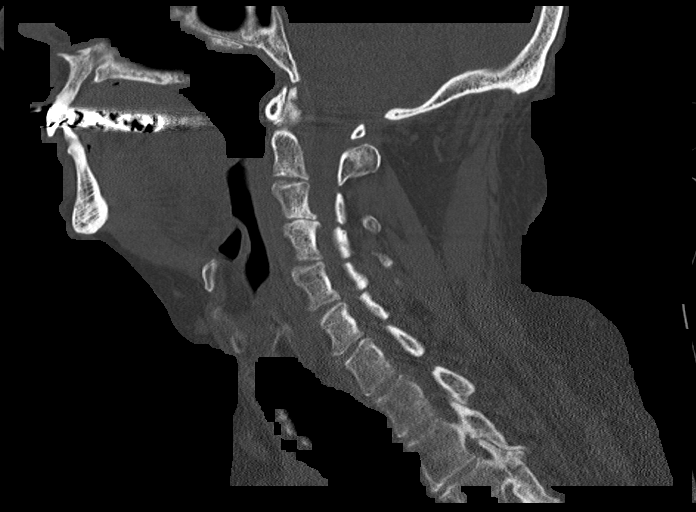
[im 64/96  bone]
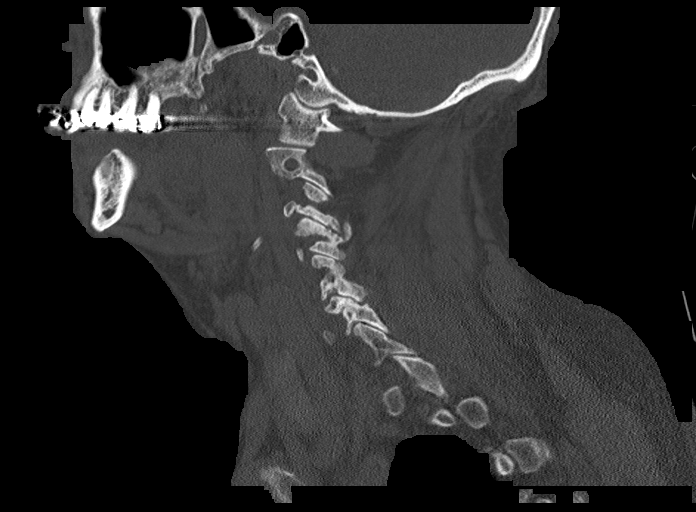

[Series 5: coronal bone · coronal · 0.45mm/px · 3 of 80 slices shown]
[im 21/80  bone]
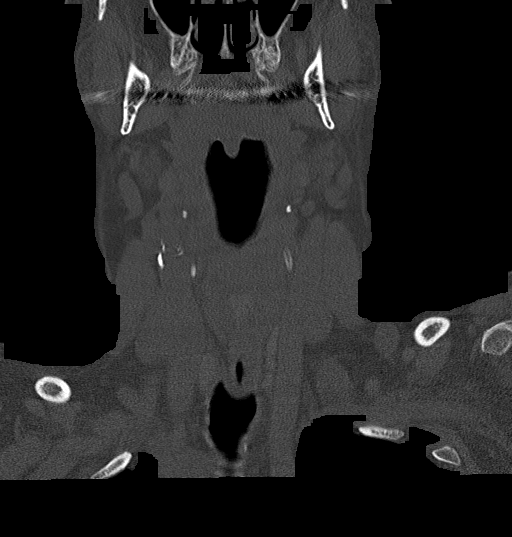
[im 34/80  bone]
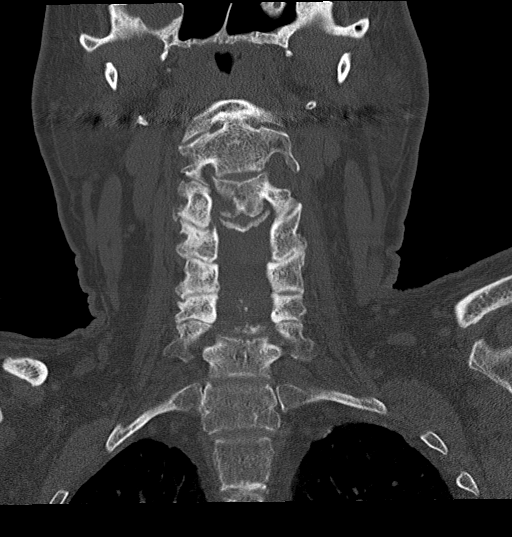
[im 46/80  bone]
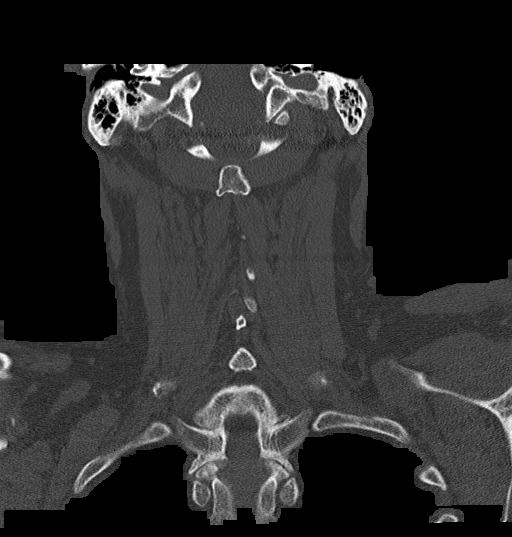

[Series 6: orthogonal bone · axial · 0.40mm/px · z∈[-336,-269]mm · 2 of 113 slices shown, 3 images]
[im 38/113  soft-tissue]
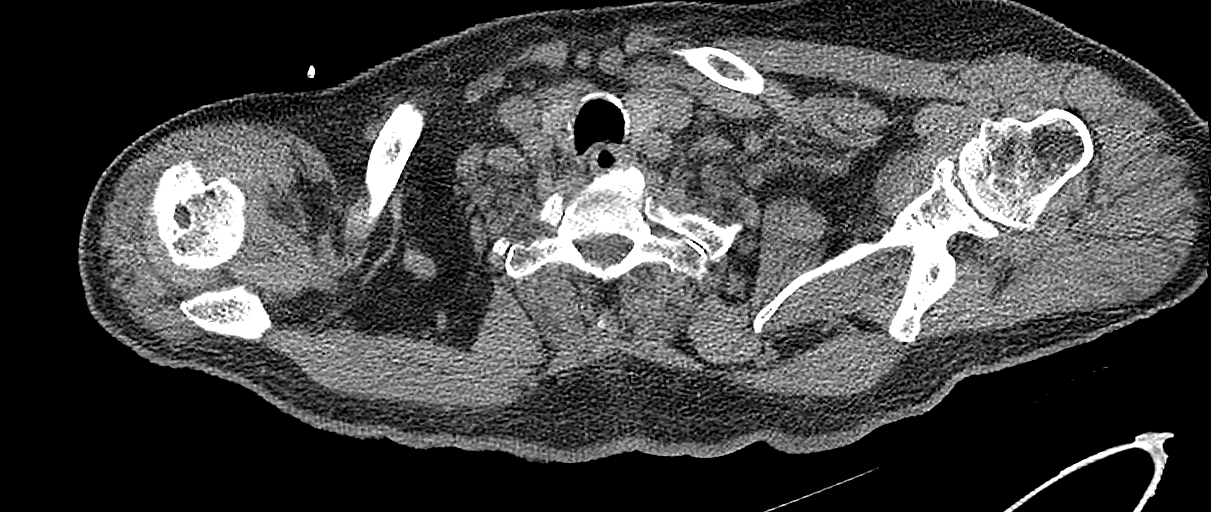
[im 38/113  bone]
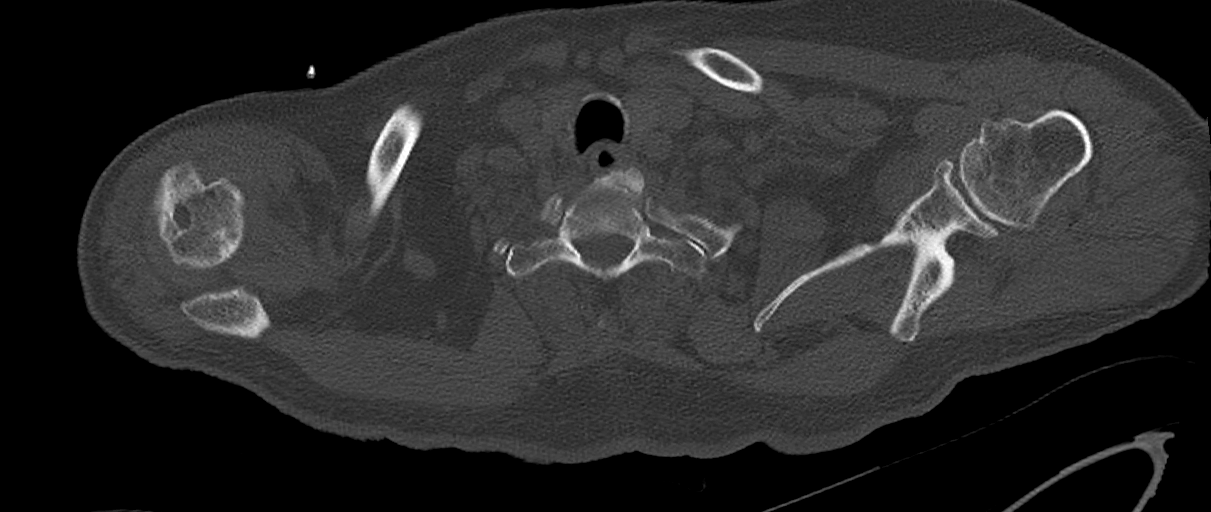
[im 75/113  bone]
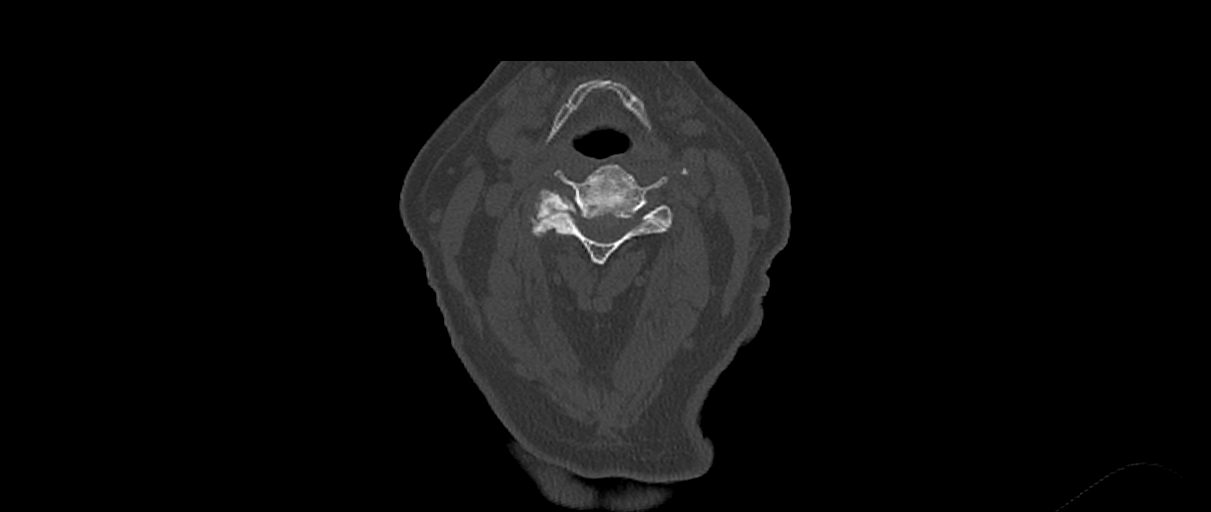

[10 of 33 positions shown; findings below may reference images not displayed]

FINDINGS: Alignment: Normal.

Skull base and vertebrae: No acute fracture. No primary bone lesion
or focal pathologic process.

Soft tissues and spinal canal: No prevertebral fluid or swelling. No
visible canal hematoma.

Disc levels: Multilevel cervical spondylosis and bilateral right
greater than left facet arthropathy.

Upper chest: Left apical pleuroparenchymal scarring.

Other: None.
IMPRESSION: 1. No acute fracture, malalignment or bony lesion.
2. Multilevel cervical spondylosis and right worse than left facet
arthropathy. Please see MRI of the cervical spine obtained earlier
today for further detail of underlying neural impingement.

## 2020-11-27 MED ORDER — ASPIRIN EC 81 MG PO TBEC
81.0000 mg | DELAYED_RELEASE_TABLET | Freq: Every day | ORAL | Status: DC
  Administered 2020-11-27 – 2020-11-28 (×2): 81 mg via ORAL
  Filled 2020-11-27 (×2): qty 1

## 2020-11-27 MED ORDER — HYDROCODONE-ACETAMINOPHEN 5-325 MG PO TABS
1.0000 | ORAL_TABLET | ORAL | Status: DC | PRN
  Administered 2020-12-02: 2 via ORAL
  Administered 2020-12-02 – 2020-12-03 (×5): 1 via ORAL
  Administered 2020-12-04: 2 via ORAL
  Administered 2020-12-04 (×2): 1 via ORAL
  Filled 2020-11-27: qty 1
  Filled 2020-11-27: qty 2
  Filled 2020-11-27 (×8): qty 1

## 2020-11-27 MED ORDER — VITAMIN D 25 MCG (1000 UNIT) PO TABS
1000.0000 [IU] | ORAL_TABLET | Freq: Every day | ORAL | Status: DC
  Administered 2020-11-27 – 2020-12-04 (×7): 1000 [IU] via ORAL
  Filled 2020-11-27 (×7): qty 1

## 2020-11-27 MED ORDER — ONDANSETRON HCL 4 MG PO TABS: 4.0000 mg | ORAL_TABLET | Freq: Four times a day (QID) | ORAL | Status: DC | PRN

## 2020-11-27 MED ORDER — ACETAMINOPHEN 325 MG PO TABS
650.0000 mg | ORAL_TABLET | Freq: Three times a day (TID) | ORAL | Status: DC | PRN
  Administered 2020-12-01 – 2020-12-03 (×3): 650 mg via ORAL
  Filled 2020-11-27 (×4): qty 2

## 2020-11-27 MED ORDER — ENOXAPARIN SODIUM 40 MG/0.4ML ~~LOC~~ SOLN
40.0000 mg | SUBCUTANEOUS | Status: DC
  Administered 2020-11-27 – 2020-11-29 (×3): 40 mg via SUBCUTANEOUS
  Filled 2020-11-27 (×3): qty 0.4

## 2020-11-27 MED ORDER — ONDANSETRON HCL 4 MG/2ML IJ SOLN: 4.0000 mg | Freq: Four times a day (QID) | INTRAMUSCULAR | Status: DC | PRN

## 2020-11-27 MED ORDER — INSULIN ASPART 100 UNIT/ML ~~LOC~~ SOLN
0.0000 [IU] | Freq: Three times a day (TID) | SUBCUTANEOUS | Status: DC
  Administered 2020-11-27 (×2): 3 [IU] via SUBCUTANEOUS
  Administered 2020-11-28: 2 [IU] via SUBCUTANEOUS
  Filled 2020-11-27 (×3): qty 1

## 2020-11-27 MED ORDER — ROSUVASTATIN CALCIUM 10 MG PO TABS
20.0000 mg | ORAL_TABLET | Freq: Every day | ORAL | Status: DC
  Administered 2020-11-27 – 2020-12-04 (×7): 20 mg via ORAL
  Filled 2020-11-27 (×3): qty 2
  Filled 2020-11-27: qty 1
  Filled 2020-11-27 (×4): qty 2

## 2020-11-27 MED ORDER — ENALAPRIL MALEATE 5 MG PO TABS
5.0000 mg | ORAL_TABLET | Freq: Every day | ORAL | Status: DC
  Administered 2020-11-27 – 2020-11-28 (×2): 5 mg via ORAL
  Filled 2020-11-27 (×2): qty 1

## 2020-11-27 NOTE — Plan of Care (Signed)

## 2020-11-27 NOTE — Consult Note (Signed)
Neurosurgery-New Consultation Evaluation 11/27/2020 Douglas Lyons [id number]  Identifying Statement: Douglas Lyons is a 75 y.o. male from [place] [address] with numbness and balance difficulty  Physician Requesting Consultation: [facility]  History of Present Illness: Douglas Lyons presents with over 3 weeks of worsening numbness in his hands bilaterally, left greater than right.  He additionally states he is had some difficulty with balance.  He is right-handed but has noted some decrease in strength over the left hand and arm.  The numbness does appear to travel up the arm towards the shoulder.  On the right side, he remains in the hand.  Overall, he does feel like this is worsening.  He does not report any inciting event and denies any falls or traumatic incidents prior to the onset.  He does not report any significant neck pain.  He did get a MRI and CT scan of the cervical spine in the emergency department.  Past Medical History:  Past Medical History:  Diagnosis Date  . Cataracts, bilateral   . Degenerative joint disease of knee, left   . Diabetes mellitus, type 2 ([place])   . Diverticulosis   . Erectile dysfunction   . GERD (gastroesophageal reflux disease)   . Heme positive stool   . Hyperlipidemia   . Hypertension   . Lumbar disc herniation   . Overweight (BMI 25.0-29.9)     Social History: Social History   Socioeconomic History  . Marital status: Married    Spouse name: Not on file  . Number of children: Not on file  . Years of education: Not on file  . Highest education level: Not on file  Occupational History  . Not on file  Tobacco Use  . Smoking status: Never Smoker  . Smokeless tobacco: Never Used  Vaping Use  . Vaping Use: Never used  Substance and Sexual Activity  . Alcohol use: Yes    Comment: OCCASIONAL   . Drug use: Not Currently  . Sexual activity: Not on file  Other Topics Concern  . Not on file  Social History  Narrative  . Not on file   Social Determinants of Health   Financial Resource Strain: Not on file  Food Insecurity: Not on file  Transportation Needs: Not on file  Physical Activity: Not on file  Stress: Not on file  Social Connections: Not on file  Intimate Partner Violence: Not on file    Family History: History reviewed. No pertinent family history.  Review of Systems:  Review of Systems - General ROS: Negative Psychological ROS: Negative Ophthalmic ROS: Negative ENT ROS: Negative Hematological and Lymphatic ROS: Negative  Endocrine ROS: Negative Respiratory ROS: Negative Cardiovascular ROS: Negative Gastrointestinal ROS: Negative Genito-Urinary ROS: Negative Musculoskeletal ROS: Negative for neck pain Neurological ROS: Positive for weakness, numbness Dermatological ROS: Negative  Physical Exam: BP (!) 159/75   Pulse 62   Temp 97.7 F (36.5 C)   Resp 16   Ht 5\' 10"  (1.778 m)   Wt 95.7 kg   SpO2 97%   BMI 30.28 kg/m  Body mass index is 30.28 kg/m. Body surface area is 2.17 meters squared. General appearance: Alert, cooperative, in no acute distress Head: Normocephalic, atraumatic Eyes: Normal, EOM intact Oropharynx: Moist without lesions Neck: Supple, range of motion appears full with no increase symptoms on extension Ext: No edema in extremities  Neurologic exam:  Mental status: alertness: alert,  affect: normal Speech: fluent and clear Motor:strength symmetric 5/5 in bilateral upper  and lower extremities with exception of 4-5 in left interossei and grip Sensory: Decreased light touch in bilateral hands as well as the forearm and distal arm on the left, peers intact in lower extremities Gait: Not tested  Laboratory: Results for orders placed or performed during the hospital encounter of 11/26/20  Resp Panel by RT-PCR (Flu A&B, Covid) Nasopharyngeal Swab   Specimen: Nasopharyngeal Swab; Nasopharyngeal(NP) swabs in vial transport medium  Result Value  Ref Range   SARS Coronavirus 2 by RT PCR NEGATIVE NEGATIVE   Influenza A by PCR NEGATIVE NEGATIVE   Influenza B by PCR NEGATIVE NEGATIVE  Protime-INR  Result Value Ref Range   Prothrombin Time 13.1 11.4 - 15.2 seconds   INR 1.0 0.8 - 1.2  APTT  Result Value Ref Range   aPTT 29 24 - 36 seconds  CBC  Result Value Ref Range   WBC 6.5 4.0 - 10.5 K/uL   RBC 4.86 4.22 - 5.81 MIL/uL   Hemoglobin 14.4 13.0 - 17.0 g/dL   HCT 43.3 39.0 - 52.0 %   MCV 89.1 80.0 - 100.0 fL   MCH 29.6 26.0 - 34.0 pg   MCHC 33.3 30.0 - 36.0 g/dL   RDW 12.9 11.5 - 15.5 %   Platelets 214 150 - 400 K/uL   nRBC 0.0 0.0 - 0.2 %  Differential  Result Value Ref Range   Neutrophils Relative % 73 %   Neutro Abs 4.7 1.7 - 7.7 K/uL   Lymphocytes Relative 16 %   Lymphs Abs 1.0 0.7 - 4.0 K/uL   Monocytes Relative 9 %   Monocytes Absolute 0.6 0.1 - 1.0 K/uL   Eosinophils Relative 1 %   Eosinophils Absolute 0.1 0.0 - 0.5 K/uL   Basophils Relative 1 %   Basophils Absolute 0.0 0.0 - 0.1 K/uL   Immature Granulocytes 0 %   Abs Immature Granulocytes 0.02 0.00 - 0.07 K/uL  Comprehensive metabolic panel  Result Value Ref Range   Sodium 137 135 - 145 mmol/L   Potassium 4.0 3.5 - 5.1 mmol/L   Chloride 103 98 - 111 mmol/L   CO2 25 22 - 32 mmol/L   Glucose, Bld 150 (H) 70 - 99 mg/dL   BUN 16 8 - 23 mg/dL   Creatinine, Ser 0.69 0.61 - 1.24 mg/dL   Calcium 9.2 8.9 - 10.3 mg/dL   Total Protein 7.1 6.5 - 8.1 g/dL   Albumin 4.2 3.5 - 5.0 g/dL   AST 12 (L) 15 - 41 U/L   ALT 12 0 - 44 U/L   Alkaline Phosphatase 58 38 - 126 U/L   Total Bilirubin 0.8 0.3 - 1.2 mg/dL   GFR, Estimated >60 >60 mL/min   Anion gap 9 5 - 15  CBG monitoring, ED  Result Value Ref Range   Glucose-Capillary 154 (H) 70 - 99 mg/dL  Type and screen [place]  Result Value Ref Range   ABO/RH(D) O NEG    Antibody Screen NEG    Sample Expiration      11/30/2020,2359 Performed at [facility], [address]  [place], [place] [address]    I personally reviewed labs  Imaging: MRI cervical spine:Degenerative disc osteophyte at C3-4 with resultant severe spinal stenosis and flattening of the cervical spinal cord. 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 Additional degenerative spondylosis at C4-5 and C5-6 with resultant moderate spinal stenosis.  Multifactorial degenerative changes with resultant multilevel foraminal narrowing as above. Notable findings include  moderate left C3 foraminal stenosis, with severe bilateral C4, C5, C6, and left C7 foraminal narrowing.  CT Cervical Spine: No acute fracture, malalignment or bony lesion. 2. Multilevel cervical spondylosis and right worse than left facet arthropathy. Please see MRI of the cervical spine obtained earlier today for further detail of underlying neural impingement.   Impression/Plan:  Douglas Lyons is here for evaluation what appears to be early myelopathy with MRI showing severe stenosis at C3-4 and more moderate at C4-5.  Given the duration of his symptoms, no emergent intervention is needed but I do recommend ensuring that he does not get hypotensive as this could worsen his symptoms.  I do recommend a cervical spine collar to help prevent any abnormal motion.  I did discuss with him the natural history of his symptoms and I do think if we do not perform surgery, the symptoms could worsen in the future.  We also discussed that the symptoms were not fully resolve but can improve over time but the main goal of the surgery is to prevent further decline.  I did discuss a C3-5 ACDF with him including risk of dysphagia, bleeding, infection, damage to the spinal cord, need for fusion.  He does need physical therapy evaluation and we can start planning for surgery, possibly on 4/8.   1.  Diagnosis: Cervical myelopathy  2.  Plan -Recommend physical therapy evaluation -[place] collar -Can plan for ACDF on 4/8

## 2020-11-27 NOTE — ED Notes (Signed)
MRI tech at bedside.

## 2020-11-27 NOTE — ED Provider Notes (Signed)
-----------------------------------------   7:05 AM on 11/27/2020 -----------------------------------------  Blood pressure (!) 159/75, pulse 62, temperature 97.7 F (36.5 C), resp. rate 16, height 5\' 10"  (1.778 m), weight 95.7 kg, SpO2 97 %.  Assuming care from Dr. Leonides Schanz.  In short, Douglas Lyons is a 75 y.o. male with a chief complaint of Numbness .  Refer to the original H&P for additional details.  The current plan of care is to follow-up NSGY recommendations for severe cervical stenosis with arm weakness and significant difficulty walking.  ----------------------------------------- 8:51 AM on 11/27/2020 -----------------------------------------  Patient evaluated by Dr. Lacinda Axon of [facility]. They will be able to schedule patient for surgery on Friday 4/8, recommend PT evaluation and cervical collar for now. Given patient's inability to ambulate safely, case discussed with hospitalist for admission.   Blake Divine, MD 11/27/20 [phone]

## 2020-11-27 NOTE — ED Notes (Signed)
Applied cervical collar; pt tolerated well; denies discomfort and call bell within reach

## 2020-11-27 NOTE — ED Notes (Signed)
MD at  Bedside. Pt in NAD

## 2020-11-27 NOTE — ED Notes (Signed)
Pt reports last PO intake 4/3 around 0900

## 2020-11-27 NOTE — H&P (View-Only) (Signed)
Neurosurgery-New Consultation Evaluation 11/27/2020 Douglas Lyons [id number]  Identifying Statement: Douglas Lyons is a 75 y.o. male from [place] [address] with numbness and balance difficulty  Physician Requesting Consultation: [facility]  History of Present Illness: Douglas Lyons presents with over 3 weeks of worsening numbness in his hands bilaterally, left greater than right.  He additionally states he is had some difficulty with balance.  He is right-handed but has noted some decrease in strength over the left hand and arm.  The numbness does appear to travel up the arm towards the shoulder.  On the right side, he remains in the hand.  Overall, he does feel like this is worsening.  He does not report any inciting event and denies any falls or traumatic incidents prior to the onset.  He does not report any significant neck pain.  He did get a MRI and CT scan of the cervical spine in the emergency department.  Past Medical History:  Past Medical History:  Diagnosis Date  . Cataracts, bilateral   . Degenerative joint disease of knee, left   . Diabetes mellitus, type 2 ([place])   . Diverticulosis   . Erectile dysfunction   . GERD (gastroesophageal reflux disease)   . Heme positive stool   . Hyperlipidemia   . Hypertension   . Lumbar disc herniation   . Overweight (BMI 25.0-29.9)     Social History: Social History   Socioeconomic History  . Marital status: Married    Spouse name: Not on file  . Number of children: Not on file  . Years of education: Not on file  . Highest education level: Not on file  Occupational History  . Not on file  Tobacco Use  . Smoking status: Never Smoker  . Smokeless tobacco: Never Used  Vaping Use  . Vaping Use: Never used  Substance and Sexual Activity  . Alcohol use: Yes    Comment: OCCASIONAL   . Drug use: Not Currently  . Sexual activity: Not on file  Other Topics Concern  . Not on file  Social History  Narrative  . Not on file   Social Determinants of Health   Financial Resource Strain: Not on file  Food Insecurity: Not on file  Transportation Needs: Not on file  Physical Activity: Not on file  Stress: Not on file  Social Connections: Not on file  Intimate Partner Violence: Not on file    Family History: History reviewed. No pertinent family history.  Review of Systems:  Review of Systems - General ROS: Negative Psychological ROS: Negative Ophthalmic ROS: Negative ENT ROS: Negative Hematological and Lymphatic ROS: Negative  Endocrine ROS: Negative Respiratory ROS: Negative Cardiovascular ROS: Negative Gastrointestinal ROS: Negative Genito-Urinary ROS: Negative Musculoskeletal ROS: Negative for neck pain Neurological ROS: Positive for weakness, numbness Dermatological ROS: Negative  Physical Exam: BP (!) 159/75   Pulse 62   Temp 97.7 F (36.5 C)   Resp 16   Ht 5\' 10"  (1.778 m)   Wt 95.7 kg   SpO2 97%   BMI 30.28 kg/m  Body mass index is 30.28 kg/m. Body surface area is 2.17 meters squared. General appearance: Alert, cooperative, in no acute distress Head: Normocephalic, atraumatic Eyes: Normal, EOM intact Oropharynx: Moist without lesions Neck: Supple, range of motion appears full with no increase symptoms on extension Ext: No edema in extremities  Neurologic exam:  Mental status: alertness: alert,  affect: normal Speech: fluent and clear Motor:strength symmetric 5/5 in bilateral upper  and lower extremities with exception of 4-5 in left interossei and grip Sensory: Decreased light touch in bilateral hands as well as the forearm and distal arm on the left, peers intact in lower extremities Gait: Not tested  Laboratory: Results for orders placed or performed during the hospital encounter of 11/26/20  Resp Panel by RT-PCR (Flu A&B, Covid) Nasopharyngeal Swab   Specimen: Nasopharyngeal Swab; Nasopharyngeal(NP) swabs in vial transport medium  Result Value  Ref Range   SARS Coronavirus 2 by RT PCR NEGATIVE NEGATIVE   Influenza A by PCR NEGATIVE NEGATIVE   Influenza B by PCR NEGATIVE NEGATIVE  Protime-INR  Result Value Ref Range   Prothrombin Time 13.1 11.4 - 15.2 seconds   INR 1.0 0.8 - 1.2  APTT  Result Value Ref Range   aPTT 29 24 - 36 seconds  CBC  Result Value Ref Range   WBC 6.5 4.0 - 10.5 K/uL   RBC 4.86 4.22 - 5.81 MIL/uL   Hemoglobin 14.4 13.0 - 17.0 g/dL   HCT 43.3 39.0 - 52.0 %   MCV 89.1 80.0 - 100.0 fL   MCH 29.6 26.0 - 34.0 pg   MCHC 33.3 30.0 - 36.0 g/dL   RDW 12.9 11.5 - 15.5 %   Platelets 214 150 - 400 K/uL   nRBC 0.0 0.0 - 0.2 %  Differential  Result Value Ref Range   Neutrophils Relative % 73 %   Neutro Abs 4.7 1.7 - 7.7 K/uL   Lymphocytes Relative 16 %   Lymphs Abs 1.0 0.7 - 4.0 K/uL   Monocytes Relative 9 %   Monocytes Absolute 0.6 0.1 - 1.0 K/uL   Eosinophils Relative 1 %   Eosinophils Absolute 0.1 0.0 - 0.5 K/uL   Basophils Relative 1 %   Basophils Absolute 0.0 0.0 - 0.1 K/uL   Immature Granulocytes 0 %   Abs Immature Granulocytes 0.02 0.00 - 0.07 K/uL  Comprehensive metabolic panel  Result Value Ref Range   Sodium 137 135 - 145 mmol/L   Potassium 4.0 3.5 - 5.1 mmol/L   Chloride 103 98 - 111 mmol/L   CO2 25 22 - 32 mmol/L   Glucose, Bld 150 (H) 70 - 99 mg/dL   BUN 16 8 - 23 mg/dL   Creatinine, Ser 0.69 0.61 - 1.24 mg/dL   Calcium 9.2 8.9 - 10.3 mg/dL   Total Protein 7.1 6.5 - 8.1 g/dL   Albumin 4.2 3.5 - 5.0 g/dL   AST 12 (L) 15 - 41 U/L   ALT 12 0 - 44 U/L   Alkaline Phosphatase 58 38 - 126 U/L   Total Bilirubin 0.8 0.3 - 1.2 mg/dL   GFR, Estimated >60 >60 mL/min   Anion gap 9 5 - 15  CBG monitoring, ED  Result Value Ref Range   Glucose-Capillary 154 (H) 70 - 99 mg/dL  Type and screen [place]  Result Value Ref Range   ABO/RH(D) O NEG    Antibody Screen NEG    Sample Expiration      11/30/2020,2359 Performed at [facility], [address]  [place], [place] [address]    I personally reviewed labs  Imaging: MRI cervical spine:Degenerative disc osteophyte at C3-4 with resultant severe spinal stenosis and flattening of the cervical spinal cord. 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 Additional degenerative spondylosis at C4-5 and C5-6 with resultant moderate spinal stenosis.  Multifactorial degenerative changes with resultant multilevel foraminal narrowing as above. Notable findings include  moderate left C3 foraminal stenosis, with severe bilateral C4, C5, C6, and left C7 foraminal narrowing.  CT Cervical Spine: No acute fracture, malalignment or bony lesion. 2. Multilevel cervical spondylosis and right worse than left facet arthropathy. Please see MRI of the cervical spine obtained earlier today for further detail of underlying neural impingement.   Impression/Plan:  Mr. Schaller is here for evaluation what appears to be early myelopathy with MRI showing severe stenosis at C3-4 and more moderate at C4-5.  Given the duration of his symptoms, no emergent intervention is needed but I do recommend ensuring that he does not get hypotensive as this could worsen his symptoms.  I do recommend a cervical spine collar to help prevent any abnormal motion.  I did discuss with him the natural history of his symptoms and I do think if we do not perform surgery, the symptoms could worsen in the future.  We also discussed that the symptoms were not fully resolve but can improve over time but the main goal of the surgery is to prevent further decline.  I did discuss a C3-5 ACDF with him including risk of dysphagia, bleeding, infection, damage to the spinal cord, need for fusion.  He does need physical therapy evaluation and we can start planning for surgery, possibly on 4/8.   1.  Diagnosis: Cervical myelopathy  2.  Plan -Recommend physical therapy evaluation -[place] collar -Can plan for ACDF on 4/8

## 2020-11-27 NOTE — Evaluation (Signed)
Physical Therapy Evaluation Patient Details Name: Douglas Lyons MRN: [id number] DOB: 1945/12/29 Today's Date: 11/27/2020   History of Present Illness  Douglas Lyons is a 61yoM who comes to [facility] on 11/26/20 with over 3 weeks of worsening numbness in his hands bilaterally, left greater than right.  He additionally states he is had some difficulty with balance.  He is right-handed but has noted some decrease in strength over the left hand and arm.  The numbness does appear to travel up the arm towards the shoulder.  On the right side, he remains in the hand.  Overall, he does feel like this is worsening.  He does not report any inciting event and denies any falls or traumatic incidents prior to the onset. 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  Clinical Impression  Pt admitted with above diagnosis. Pt currently with functional limitations due to the deficits listed below (see "PT Problem List"). Upon entry, pt in bed and agreeable to participate. Pt requires minA for transfers and minA for household distance AMB. Pt is limited by neurological weakness in 4 limbs and anesthesia of hands. Patient's performance this date reveals decreased ability, independence, and tolerance in performing all basic mobility required for performance of activities of daily living. Pt requires additional DME, close physical assistance, and cues for safe participate in mobility. Pt will benefit from skilled PT intervention to increase independence and safety with basic mobility in preparation for discharge to the venue listed below.       Follow Up Recommendations Home health PT;Supervision for mobility/OOB    Equipment Recommendations  None recommended by PT    Recommendations for Other Services       Precautions / Restrictions Precautions Precautions:  Fall;Cervical Precaution Booklet Issued: No Required Braces or Orthoses: Cervical Brace Cervical Brace: Hard collar Restrictions Weight Bearing Restrictions: No      Mobility  Bed Mobility Overal bed mobility: Modified Independent                  Transfers Overall transfer level: Needs assistance Equipment used: Rolling walker (2 wheeled) Transfers: Sit to/from Stand Sit to Stand: Min assist         General transfer comment: high velocity trunk thrust forward to clear buttocks; MinGuard assist from recliner with arm-rests to push off  Ambulation/Gait   Gait Distance (Feet): 80 Feet Assistive device: Rolling walker (2 wheeled) Gait Pattern/deviations: Ataxic     General Gait Details: Wide based gait, loses control of RW anteriorly twice requiring assist from author. Legs appear weak and lacking rigidity.  Stairs            Wheelchair Mobility    Modified Rankin (Stroke Patients Only)       Balance Overall balance assessment: Needs assistance Sitting-balance support: No upper extremity supported;Feet supported Sitting balance-Leahy Scale: Good     Standing balance support: Bilateral upper extremity supported;During functional activity Standing balance-Leahy Scale: Poor                               Pertinent Vitals/Pain Pain Assessment: No/denies pain    Home Living Family/patient expects to be discharged to:: Private residence Living Arrangements: Spouse/significant other Available Help at Discharge: Family (brother 5 miles down the road) Type of Home: House Home Access: Stairs  to enter Entrance Stairs-Rails: None (posts on each side without rails) Entrance Stairs-Number of Steps: 3 Home Layout: One level Home Equipment: Walker - 2 wheels Additional Comments: brother has a rollator for patient to borrow    Prior Function Level of Independence: Independent         Comments: 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, newspaper        Extremity/Trunk Assessment   Upper Extremity Assessment Upper Extremity Assessment: Generalized weakness (bimanual anesthesia; bilateral weakness of gross grip moderate Rt, severe Left))    Lower Extremity Assessment Lower Extremity Assessment: Generalized weakness;Overall WFL for tasks assessed       Communication      Cognition Arousal/Alertness: Awake/alert Behavior During Therapy: WFL for tasks assessed/performed Overall Cognitive Status: Within Functional Limits for tasks assessed                                        General Comments      Exercises     Assessment/Plan    PT Assessment Patient needs continued PT services  PT Problem List Decreased strength;Decreased activity tolerance;Decreased balance       PT Treatment Interventions DME instruction;Gait training;Stair training;Therapeutic activities;Functional mobility training;Neuromuscular re-education;Balance training;Cognitive remediation;Patient/family education;Therapeutic exercise    PT Goals (Current goals can be found in the Care Plan section)  Acute Rehab PT Goals Patient Stated Goal: regain baseline mobility PT Goal Formulation: With patient Time For Goal Achievement: 12/11/20 Potential to Achieve Goals: Good    Frequency 7X/week   Barriers to discharge        Co-evaluation               AM-PAC PT "6 Clicks" Mobility  Outcome Measure Help needed turning from your back to your side while in a flat bed without using bedrails?: None Help needed moving from lying on your back to sitting on the side of a flat bed without using bedrails?: None Help needed moving to and from a bed to a chair (including a wheelchair)?: A Lot Help needed standing up from a chair using your arms (e.g., wheelchair or bedside chair)?: A Lot Help needed to walk in hospital room?: A Lot Help needed climbing 3-5 steps with a railing? : A Lot 6 Click Score: 16    End  of Session Equipment Utilized During Treatment: Gait belt;Cervical collar Activity Tolerance: Patient tolerated treatment well;Patient limited by fatigue Patient left: in chair;with call bell/phone within reach;Other (comment) (gross motor callbell) Nurse Communication: Mobility status PT Visit Diagnosis: Unsteadiness on feet (R26.81);Difficulty in walking, not elsewhere classified (R26.2);Other abnormalities of gait and mobility (R26.89);Muscle weakness (generalized) (M62.81);Ataxic gait (R26.0);Other symptoms and signs involving the nervous system (R29.898)    Time: 9417-4081 PT Time Calculation (min) (ACUTE ONLY): 18 min   Charges:   PT Evaluation $PT Eval Moderate Complexity: 1 Mod         5:47 PM, 11/27/20 Etta Grandchild, PT, DPT Physical Therapist - [facility]  [phone] ([place])    Price C 11/27/2020, 5:45 PM

## 2020-11-27 NOTE — ED Notes (Signed)
Pt resting at this time, RR even and unlabored

## 2020-11-27 NOTE — H&P (Signed)
History and Physical    Douglas Lyons ESP:[id number] DOB: 1946/02/09 DOA: 11/26/2020  PCP: Sallee Lange, NP   Patient coming from: Home  I have personally briefly reviewed patient's old medical records in [place]  Chief Complaint: Numbness/difficulty with ambulation  HPI: Douglas Lyons is a 75 y.o. male with medical history significant for diabetes mellitus, GERD, hypertension, dyslipidemia who presents to the ER for evaluation of numbness and tingling which initially started in his left fingertips about a month ago but has now progressed to weakness of his left upper extremity as well as some numbness and tingling in his right hand.  He also notes that he has been unable to ambulate for about 4 days.  He has some weakness in his left leg which he states his primary care provider noted during his last visit but now he is unable to ambulate because he feels his legs are unsteady and that he would fall. He does not have a known diagnosis of neuropathy and denies having any low back pain.  He denies having any urinary or fecal incontinence, no recent trauma, no falls or saddle anesthesia. He denies having any chest pain, no shortness of breath, no nausea, no vomiting, no urinary frequency, no dysuria, no hematuria, no headache, no constipation, no diarrhea, no palpitations, no diaphoresis, no cough, no fever, no chills. Labs show sodium 137, potassium 4.0, chloride 103, bicarb 25 glucose 150, BUN 16, creatinine 0.69, calcium 9.2, alkaline phosphatase 58, albumin 4.2, AST 12, ALT 12, total protein 7.1, white count 6.5, hemoglobin 14.4, hematocrit 43.3, MCV 89.9, RDW 33, platelet count 12.9, platelet count 214, PT 13.1, INR 1.0 Respiratory viral panel is negative CT scan of cervical spine no acute fracture, malalignment or bony lesion. Multilevel cervical spondylosis and right worse than left facet arthropathy. Please see MRI of the cervical spine obtained earlier today for further  detail of underlying neural impingement. MRI of cervical spine shows degenerative disc osteophyte at C3-4 with resultant severe spinal stenosis and flattening of the cervical spinal cord. 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 Additional degenerative spondylosis at C4-5 and C5-6 with resultant moderate spinal stenosis. Multifactorial degenerative changes with resultant multilevel foraminal narrowing as above. Notable findings include moderate left C3 foraminal stenosis, with severe bilateral C4, C5, C6, and left C7 foraminal narrowing. Reactive marrow edema about the right C3-4 facet due to facet arthritis. Finding could serve as a source for neck pain. MRI of the brain without contrast is negative brain MRI for age. No acute intracranial abnormality identified. 2.7 cm sclerotic lesion involving the left calvarium, also seen on prior head CT. Again, further evaluation with non emergent outpatient bone scan recommended for further evaluation. Twelve-lead EKG reviewed by me shows normal sinus rhythm with nonspecific ST and T wave changes.     ED Course: Patient is a 75 year old Caucasian male who presents to the ER for evaluation of difficulty with ambulation.  He gives a 4-week history of tingling and numbness which started in his fingers on his left hand and has progressed to left upper extremity weakness with numbness involving his right upper extremity and now has difficulty with ambulation.  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  Patient will be admitted to the hospital since he is unable to ambulate.  Neurosurgery has been consulted.   Review of Systems: As per HPI otherwise  all other systems reviewed and negative.    Past Medical History:  Diagnosis Date  . Cataracts, bilateral   . Degenerative joint disease of knee,  left   . Diabetes mellitus, type 2 ([place])   . Diverticulosis   . Erectile dysfunction   . GERD (gastroesophageal reflux disease)   . Heme positive stool   . Hyperlipidemia   . Hypertension   . Lumbar disc herniation   . Overweight (BMI 25.0-29.9)     Past Surgical History:  Procedure Laterality Date  . COLONOSCOPY    . COLONOSCOPY N/A 07/26/2015   Procedure: COLONOSCOPY;  Surgeon: Manya Silvas, MD;  Location: [facility] ENDOSCOPY;  Service: Endoscopy;  Laterality: N/A;  . COLONOSCOPY N/A 09/29/2020   Procedure: COLONOSCOPY;  Surgeon: [name], MD;  Location: [facility] ENDOSCOPY;  Service: Endoscopy;  Laterality: N/A;  . SHOULDER ARTHROSCOPY Right   . VASECTOMY       reports that he has never smoked. He has never used smokeless tobacco. He reports current alcohol use. He reports previous drug use.  No Known Allergies  History reviewed. No pertinent family history.    Prior to Admission medications   Medication Sig Start Date End Date Taking? Authorizing Provider  aspirin EC 81 MG tablet Take 81 mg by mouth daily.    [provider]  enalapril (VASOTEC) 5 MG tablet Take 5 mg by mouth daily.    [provider]  glipiZIDE-metformin (METAGLIP) 2.5-500 MG tablet Take 1 tablet by mouth 2 (two) times daily before a meal.    [provider]  polyethylene glycol powder (GLYCOLAX/MIRALAX) powder Take 0.5 Containers by mouth once.    [provider]  rosuvastatin (CRESTOR) 20 MG tablet Take 20 mg by mouth daily.    [provider]    Physical Exam: Vitals:   11/27/20 0351 11/27/20 0533 11/27/20 0655 11/27/20 0932  BP: (!) 160/79 (!) 163/42 (!) 159/75 (!) 166/76  Pulse: 60 61 62 62  Resp: 16 16 16 18   Temp:   97.7 F (36.5 C) 98 F (36.7 C)  TempSrc:    Oral  SpO2: 96% 97% 97% 96%  Weight:      Height:         Vitals:   11/27/20 0351 11/27/20 0533 11/27/20 0655 11/27/20 0932  BP: (!) 160/79 (!) 163/42 (!) 159/75 (!) 166/76   Pulse: 60 61 62 62  Resp: 16 16 16 18   Temp:   97.7 F (36.5 C) 98 F (36.7 C)  TempSrc:    Oral  SpO2: 96% 97% 97% 96%  Weight:      Height:          Constitutional: Alert and oriented x 3. Not in any apparent distress HEENT:      Head: Normocephalic and atraumatic.         Eyes: PERLA, EOMI, Conjunctivae are normal. Sclera is non-icteric.       Mouth/Throat: Mucous membranes are moist.       Neck: Supple with no signs of meningismus. Cardiovascular: Regular rate and rhythm. No murmurs, gallops, or rubs. 2+ symmetrical distal pulses are present . No JVD. No LE edema Respiratory: Respiratory effort normal .Lungs sounds clear bilaterally. No wheezes, crackles, or rhonchi.  Gastrointestinal: Soft, non tender, and non distended with positive bowel sounds.  Genitourinary: No CVA tenderness. Musculoskeletal: Nontender with normal range of motion in all extremities. No cyanosis, or erythema of extremities. Neurologic:  Face is symmetric. Moving all extremities. Strength 4/5  in left upper extremity compared to right.  No gross focal neurologic deficits  Skin: Skin is warm, dry.  No rash or ulcers Psychiatric: Mood and affect are normal    Labs on Admission: I have personally reviewed following labs and imaging studies  CBC: Recent Labs  Lab 11/26/20 1849  WBC 6.5  NEUTROABS 4.7  HGB 14.4  HCT 43.3  MCV 89.1  PLT 315   Basic Metabolic Panel: Recent Labs  Lab 11/26/20 1849  NA 137  K 4.0  CL 103  CO2 25  GLUCOSE 150*  BUN 16  CREATININE 0.69  CALCIUM 9.2   GFR: Estimated Creatinine Clearance: 91.2 mL/min (by C-G formula based on SCr of 0.69 mg/dL). Liver Function Tests: Recent Labs  Lab 11/26/20 1849  AST 12*  ALT 12  ALKPHOS 58  BILITOT 0.8  PROT 7.1  ALBUMIN 4.2   No results for input(s): LIPASE, AMYLASE in the last 168 hours. No results for input(s): AMMONIA in the last 168 hours. Coagulation Profile: Recent Labs  Lab 11/26/20 1849  INR 1.0    Cardiac Enzymes: No results for input(s): CKTOTAL, CKMB, CKMBINDEX, TROPONINI in the last 168 hours. BNP (last 3 results) No results for input(s): PROBNP in the last 8760 hours. HbA1C: No results for input(s): HGBA1C in the last 72 hours. CBG: Recent Labs  Lab 11/26/20 1851  GLUCAP 154*   Lipid Profile: No results for input(s): CHOL, HDL, LDLCALC, TRIG, CHOLHDL, LDLDIRECT in the last 72 hours. Thyroid Function Tests: No results for input(s): TSH, T4TOTAL, FREET4, T3FREE, THYROIDAB in the last 72 hours. Anemia Panel: No results for input(s): VITAMINB12, FOLATE, FERRITIN, TIBC, IRON, RETICCTPCT in the last 72 hours. Urine analysis: No results found for: COLORURINE, APPEARANCEUR, LABSPEC, [place], GLUCOSEU, HGBUR, BILIRUBINUR, KETONESUR, PROTEINUR, UROBILINOGEN, NITRITE, LEUKOCYTESUR  Radiological Exams on Admission: CT HEAD WO CONTRAST  Result Date: 11/26/2020 CLINICAL DATA:  Neuro deficit. Acute stroke suspected. Bilateral high on numbness for 2 weeks. EXAM: CT HEAD WITHOUT CONTRAST TECHNIQUE: Contiguous axial images were obtained from the base of the skull through the vertex without intravenous contrast. COMPARISON:  None. FINDINGS: Brain: No subdural, epidural, or subarachnoid hemorrhage identified. No mass effect or midline shift. Ventricles and sulci are unremarkable. Cerebellum, brainstem, and basal cisterns are normal. No acute cortical ischemia or infarct. Vascular: Calcified atherosclerosis in the intracranial carotids. Skull: Sclerosis is seen in the lateral calvarium as seen on series 3, image 48 no other abnormalities. Sinuses/Orbits: Fluid in the inferior left mastoid air cells identified without evidence of fracture or erosion. Mastoid air cells and middle ears are otherwise unremarkable. Other: None. IMPRESSION: 1. No acute intracranial abnormalities identified. 2. Sclerosis seen in the lateral calvarium as seen on series 3, image 48 is nonspecific. Recommend a bone scan as an  outpatient for further evaluation. Electronically Signed   By: Dorise Bullion III M.D   On: 11/26/2020 19:41   CT Cervical Spine Wo Contrast  Result Date: 11/27/2020 CLINICAL DATA:  Spinal stenosis EXAM: CT CERVICAL SPINE WITHOUT CONTRAST TECHNIQUE: Multidetector CT imaging of the cervical spine was performed without intravenous contrast. Multiplanar CT image reconstructions were also generated. COMPARISON:  MRI cervical spine obtained earlier today FINDINGS: Alignment: Normal. Skull base and vertebrae: No acute fracture. No primary bone lesion or focal pathologic process. Soft tissues and spinal canal: No prevertebral fluid or swelling. No visible canal hematoma. Disc levels: Multilevel cervical spondylosis and bilateral right greater than left facet arthropathy. Upper chest: Left apical pleuroparenchymal scarring. Other: None. IMPRESSION:  1. No acute fracture, malalignment or bony lesion. 2. Multilevel cervical spondylosis and right worse than left facet arthropathy. Please see MRI of the cervical spine obtained earlier today for further detail of underlying neural impingement. Electronically Signed   By: Jacqulynn Cadet M.D.   On: 11/27/2020 06:07   MR BRAIN WO CONTRAST  Result Date: 11/27/2020 CLINICAL DATA:  Initial evaluation for neuro deficit, stroke suspected. Bilateral arm weakness and numbness. EXAM: MRI HEAD WITHOUT CONTRAST MRI CERVICAL SPINE WITHOUT CONTRAST TECHNIQUE: Multiplanar, multiecho pulse sequences of the brain and surrounding structures, and cervical spine, to include the craniocervical junction and cervicothoracic junction, were obtained without intravenous contrast. COMPARISON:  Prior CT from 11/26/2020 FINDINGS: MRI HEAD FINDINGS Brain: Generalized age-related cerebral atrophy. No significant cerebral white matter disease for age. No abnormal foci of restricted diffusion to suggest acute or subacute ischemia. Gray-white matter differentiation maintained. No encephalomalacia to  suggest chronic cortical infarction. No foci of susceptibility artifact to suggest acute or chronic intracranial hemorrhage. No mass lesion, midline shift or mass effect. No hydrocephalus or extra-axial fluid collection. Pituitary gland suprasellar region within normal limits. Midline structures intact. Vascular: Major intracranial vascular flow voids are maintained at the skull base. Skull and upper cervical spine: Craniocervical junction within normal limits. Bone marrow signal intensity normal. Note made of a 2.7 cm T2 hypointense sclerotic lesion at the left calvarium, also seen on prior CT. No other focal marrow replacing lesion. Sinuses/Orbits: Globes and orbital soft tissues demonstrate no acute finding. Paranasal sinuses are clear. Small bilateral mastoid effusions noted, of doubtful significance. Inner ear structures grossly normal. Visualized nasopharynx within normal limits. Other: None. MRI CERVICAL SPINE FINDINGS Alignment: Vertebral bodies normally aligned with preservation of the normal cervical lordosis. No listhesis. Vertebrae: Vertebral body height maintained without acute or chronic fracture. Bone marrow signal intensity within normal limits. No discrete or worrisome osseous lesions. Discogenic reactive endplate change noted about the C3-4 interspace. Reactive marrow edema noted about the right C3-4 facet as well due to facet arthritis. No other abnormal marrow edema. Cord: Patchy signal abnormality seen involving the left greater than right cord at the level of C3-4, consistent with compressive myelopathy (series 10, image 10). Signal intensity within the cervical spinal cord otherwise within normal limits. Posterior Fossa, vertebral arteries, paraspinal tissues: Craniocervical junction normal. Paraspinous and prevertebral soft tissues normal. Normal flow voids seen within the vertebral arteries bilaterally. Disc levels: C2-C3: Disc bulge with bilateral uncovertebral hypertrophy. No spinal  stenosis. Moderate left C3 foraminal narrowing. Right neural foramen remains patent. C3-C4: Degenerative intervertebral disc space narrowing with diffuse disc osteophyte complex. Broad posterior component indents and effaces the ventral thecal sac, contacting and flattening the cervical spinal cord. Associated cord signal changes consistent with myelopathy. Superimposed severe right-sided facet degeneration. Resultant severe spinal stenosis with the thecal sac measuring 3 mm in AP diameter at its most narrow point. Severe bilateral C4 foraminal narrowing. C4-C5: Degenerative intervertebral disc space narrowing with diffuse disc osteophyte complex. Broad central to left paracentral posterior component indents and partially faces the ventral thecal sac (series 10, image 14). Well cord flattening without cord signal changes. Moderate spinal stenosis. Severe left worse than right C5 foraminal stenosis. C5-C6: Diffuse disc bulge with bilateral uncovertebral hypertrophy. Right worse than left facet degeneration. Resultant mild to moderate spinal stenosis without cord impingement. Severe right worse than left C6 foraminal narrowing. C6-C7: Diffuse disc bulge with bilateral uncovertebral hypertrophy. Flattening of the ventral thecal sac with resultant mild spinal stenosis. No cord impingement. Severe  left with moderate right C7 foraminal stenosis. C7-T1: Negative interspace. Left worse than right facet degeneration. No spinal stenosis. Foramina remain patent. Visualized upper thoracic spine demonstrates no significant finding. IMPRESSION: MRI HEAD IMPRESSION: 1. Negative brain MRI for age. No acute intracranial abnormality identified. 2. 2.7 cm sclerotic lesion involving the left calvarium, also seen on prior head CT. Again, further evaluation with nonemergent outpatient bone scan recommended for further evaluation. MRI CERVICAL SPINE IMPRESSION: 1. Degenerative disc osteophyte at C3-4 with resultant severe spinal stenosis  and flattening of the cervical spinal cord. 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 2. Additional degenerative spondylosis at C4-5 and C5-6 with resultant moderate spinal stenosis. 3. Multifactorial degenerative changes with resultant multilevel foraminal narrowing as above. Notable findings include moderate left C3 foraminal stenosis, with severe bilateral C4, C5, C6, and left C7 foraminal narrowing. 4. Reactive marrow edema about the right C3-4 facet due to facet arthritis. Finding could serve as a source for neck pain. Electronically Signed   By: Jeannine Boga M.D.   On: 11/27/2020 02:06   MR Cervical Spine Wo Contrast  Result Date: 11/27/2020 CLINICAL DATA:  Initial evaluation for neuro deficit, stroke suspected. Bilateral arm weakness and numbness. EXAM: MRI HEAD WITHOUT CONTRAST MRI CERVICAL SPINE WITHOUT CONTRAST TECHNIQUE: Multiplanar, multiecho pulse sequences of the brain and surrounding structures, and cervical spine, to include the craniocervical junction and cervicothoracic junction, were obtained without intravenous contrast. COMPARISON:  Prior CT from 11/26/2020 FINDINGS: MRI HEAD FINDINGS Brain: Generalized age-related cerebral atrophy. No significant cerebral white matter disease for age. No abnormal foci of restricted diffusion to suggest acute or subacute ischemia. Gray-white matter differentiation maintained. No encephalomalacia to suggest chronic cortical infarction. No foci of susceptibility artifact to suggest acute or chronic intracranial hemorrhage. No mass lesion, midline shift or mass effect. No hydrocephalus or extra-axial fluid collection. Pituitary gland suprasellar region within normal limits. Midline structures intact. Vascular: Major intracranial vascular flow voids are maintained at the skull base. Skull and upper cervical spine: Craniocervical junction within normal limits. Bone marrow signal intensity normal. Note made of a 2.7 cm T2  hypointense sclerotic lesion at the left calvarium, also seen on prior CT. No other focal marrow replacing lesion. Sinuses/Orbits: Globes and orbital soft tissues demonstrate no acute finding. Paranasal sinuses are clear. Small bilateral mastoid effusions noted, of doubtful significance. Inner ear structures grossly normal. Visualized nasopharynx within normal limits. Other: None. MRI CERVICAL SPINE FINDINGS Alignment: Vertebral bodies normally aligned with preservation of the normal cervical lordosis. No listhesis. Vertebrae: Vertebral body height maintained without acute or chronic fracture. Bone marrow signal intensity within normal limits. No discrete or worrisome osseous lesions. Discogenic reactive endplate change noted about the C3-4 interspace. Reactive marrow edema noted about the right C3-4 facet as well due to facet arthritis. No other abnormal marrow edema. Cord: Patchy signal abnormality seen involving the left greater than right cord at the level of C3-4, consistent with compressive myelopathy (series 10, image 10). Signal intensity within the cervical spinal cord otherwise within normal limits. Posterior Fossa, vertebral arteries, paraspinal tissues: Craniocervical junction normal. Paraspinous and prevertebral soft tissues normal. Normal flow voids seen within the vertebral arteries bilaterally. Disc levels: C2-C3: Disc bulge with bilateral uncovertebral hypertrophy. No spinal stenosis. Moderate left C3 foraminal narrowing. Right neural foramen remains patent. C3-C4: Degenerative intervertebral disc space narrowing with diffuse disc osteophyte complex. Broad posterior component indents and effaces the ventral thecal sac, contacting and flattening the cervical spinal cord. Associated cord signal changes consistent with  myelopathy. Superimposed severe right-sided facet degeneration. Resultant severe spinal stenosis with the thecal sac measuring 3 mm in AP diameter at its most narrow point. Severe  bilateral C4 foraminal narrowing. C4-C5: Degenerative intervertebral disc space narrowing with diffuse disc osteophyte complex. Broad central to left paracentral posterior component indents and partially faces the ventral thecal sac (series 10, image 14). Well cord flattening without cord signal changes. Moderate spinal stenosis. Severe left worse than right C5 foraminal stenosis. C5-C6: Diffuse disc bulge with bilateral uncovertebral hypertrophy. Right worse than left facet degeneration. Resultant mild to moderate spinal stenosis without cord impingement. Severe right worse than left C6 foraminal narrowing. C6-C7: Diffuse disc bulge with bilateral uncovertebral hypertrophy. Flattening of the ventral thecal sac with resultant mild spinal stenosis. No cord impingement. Severe left with moderate right C7 foraminal stenosis. C7-T1: Negative interspace. Left worse than right facet degeneration. No spinal stenosis. Foramina remain patent. Visualized upper thoracic spine demonstrates no significant finding. IMPRESSION: MRI HEAD IMPRESSION: 1. Negative brain MRI for age. No acute intracranial abnormality identified. 2. 2.7 cm sclerotic lesion involving the left calvarium, also seen on prior head CT. Again, further evaluation with nonemergent outpatient bone scan recommended for further evaluation. MRI CERVICAL SPINE IMPRESSION: 1. Degenerative disc osteophyte at C3-4 with resultant severe spinal stenosis and flattening of the cervical spinal cord. 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 2. Additional degenerative spondylosis at C4-5 and C5-6 with resultant moderate spinal stenosis. 3. Multifactorial degenerative changes with resultant multilevel foraminal narrowing as above. Notable findings include moderate left C3 foraminal stenosis, with severe bilateral C4, C5, C6, and left C7 foraminal narrowing. 4. Reactive marrow edema about the right C3-4 facet due to facet arthritis. Finding  could serve as a source for neck pain. Electronically Signed   By: Jeannine Boga M.D.   On: 11/27/2020 02:06     Assessment/Plan Principal Problem:   Stenosis of cervical spine with myelopathy (HCC) Active Problems:   Gait instability   Diabetes mellitus, type 2 (HCC)   Hypertension   GERD (gastroesophageal reflux disease)    Stenosis of cervical spine with myelopathy Patient presents to the ER for evaluation of difficulty with ambulation and gives a history of progressive tingling and numbness which initially started in the fingers of his left hand but has now progressed to involve both upper extremities. He had an MRI of the cervical spine which shows degenerative disc osteophyte at C3-4 with resultant severe spinal stenosis and flattening of the cervical spinal cord. 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 Neurosurgery has been consulted and he recommends to place the patient in [place] J collar.  Physical therapy evaluation has been recommended. Anterior cervical discectomy and fusion is planned for 04/08    Hypertension Continue enalapril   Diabetes mellitus Maintain consistent carbohydrate diet Hold oral hypoglycemic agents Sliding scale coverage with insulin.  Accu-Cheks before meals and at bedtime   Dyslipidemia Continue statins     DVT prophylaxis: Lovenox Code Status: full code Family Communication: Greater than 50% of time was spent discussing patient's condition and plan of care with him at the bedside.  All questions and concerns have been addressed.  He verbalizes understanding and agrees with the plan Disposition Plan: Back to previous home environment Consults called: Neurosurgery, physical therapy Status:  At the time of admission, it appears that the appropriate admission status for this patient is inpatient.   This is judged to be reasonable and necessary in order to provide the required intensity of  service to  ensure the patient's safety given the presenting symptoms, physical exam findings and initial radiographic and laboratory data in the context of their comorbid conditions. Patient requires inpatient status due to high intensity of service, high risk for further deterioration and high frequency of surveillance required.   Collier Bullock MD [facility]     11/27/2020, 9:34 AM

## 2020-11-28 DIAGNOSIS — M4802 Spinal stenosis, cervical region: Secondary | ICD-10-CM | POA: Diagnosis not present

## 2020-11-28 DIAGNOSIS — N529 Male erectile dysfunction, unspecified: Secondary | ICD-10-CM | POA: Insufficient documentation

## 2020-11-28 DIAGNOSIS — G992 Myelopathy in diseases classified elsewhere: Secondary | ICD-10-CM | POA: Diagnosis not present

## 2020-11-28 LAB — BASIC METABOLIC PANEL
Anion gap: 6 (ref 5–15)
BUN: 15 mg/dL (ref 8–23)
CO2: 25 mmol/L (ref 22–32)
Calcium: 8.9 mg/dL (ref 8.9–10.3)
Chloride: 105 mmol/L (ref 98–111)
Creatinine, Ser: 0.74 mg/dL (ref 0.61–1.24)
GFR, Estimated: >60 mL/min (ref >60)
Glucose, Bld: 150 mg/dL — ABNORMAL HIGH (ref 70–99)
Potassium: 4 mmol/L (ref 3.5–5.1)
Sodium: 136 mmol/L (ref 135–145)

## 2020-11-28 LAB — GLUCOSE, CAPILLARY
Glucose-Capillary: 129 mg/dL — ABNORMAL HIGH (ref 70–99)
Glucose-Capillary: 153 mg/dL — ABNORMAL HIGH (ref 70–99)
Glucose-Capillary: 136 mg/dL — ABNORMAL HIGH (ref 70–99)

## 2020-11-28 LAB — CBC
HCT: 40.9 % (ref 39.0–52.0)
Hemoglobin: 14.1 g/dL (ref 13.0–17.0)
MCH: 30.1 pg (ref 26.0–34.0)
MCHC: 34.5 g/dL (ref 30.0–36.0)
MCV: 87.2 fL (ref 80.0–100.0)
Platelets: 199 10*3/uL (ref 150–400)
RBC: 4.69 MIL/uL (ref 4.22–5.81)
RDW: 12.9 % (ref 11.5–15.5)
WBC: 4.9 10*3/uL (ref 4.0–10.5)
nRBC: 0 % (ref 0.0–0.2)

## 2020-11-28 MED ORDER — ENALAPRIL MALEATE 5 MG PO TABS
15.0000 mg | ORAL_TABLET | Freq: Once | ORAL | Status: AC
  Administered 2020-11-28: 15 mg via ORAL
  Filled 2020-11-28: qty 1

## 2020-11-28 MED ORDER — ENALAPRIL MALEATE 10 MG PO TABS
10.0000 mg | ORAL_TABLET | Freq: Every day | ORAL | Status: DC
  Administered 2020-11-29 – 2020-12-04 (×5): 10 mg via ORAL
  Filled 2020-11-28 (×6): qty 1

## 2020-11-28 MED ORDER — ENALAPRIL MALEATE 20 MG PO TABS: 20.0000 mg | ORAL_TABLET | Freq: Every day | ORAL | Status: DC

## 2020-11-28 MED ORDER — HYDRALAZINE HCL 25 MG PO TABS
25.0000 mg | ORAL_TABLET | Freq: Four times a day (QID) | ORAL | Status: DC | PRN
  Administered 2020-12-02 – 2020-12-03 (×3): 25 mg via ORAL
  Filled 2020-11-28 (×3): qty 1

## 2020-11-28 MED ORDER — METFORMIN HCL ER 500 MG PO TB24
1000.0000 mg | ORAL_TABLET | Freq: Two times a day (BID) | ORAL | Status: DC
  Administered 2020-11-28 – 2020-11-30 (×5): 1000 mg via ORAL
  Filled 2020-11-28 (×6): qty 2

## 2020-11-28 NOTE — Progress Notes (Signed)
Physical Therapy Treatment Patient Details Name: Douglas Lyons MRN: [id number] DOB: Jan 20, 1946 Today's Date: 11/28/2020    History of Present Illness Kushal Saunders is a 28yoM who comes to [facility] on 11/26/20 with over 3 weeks of worsening numbness in his hands bilaterally, left greater than right.  He additionally states he is had some difficulty with balance.  He is right-handed but has noted some decrease in strength over the left hand and arm.  The numbness does appear to travel up the arm towards the shoulder.  On the right side, he remains in the hand.  Overall, he does feel like this is worsening.  He does not report any inciting event and denies any falls or traumatic incidents prior to the onset. 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    PT Comments    Pt in recliner c hard collar donned at entry, wife in room. Pt agreeable to participate, denies any changes in symptoms since previous day, author assesses B grip strength, is unchanged. Moderate multimodal cues for safe use of RW and safe technique for transfers and AMB c  RW, intermittent minA to minGuard for safety. Pt continues to display heavy gait ataxia, severe neurological weakness of legs (Left > Rt), but is able to better control the RW in gait. Pt performs 4 stairs with 2 rails, but descending is complicated by leg weakness, will require continued training. Pt perfoms 5xSTS from recliner at end of session. AMB really not safe beyond 174ft at this time due to legs entering spaghetti-mode (cooked spaghetti).    Follow Up Recommendations  Supervision for mobility/OOB;Follow surgeon's recommendation for DC plan and follow-up therapies Will need a new PT evaluation post op to determine most cogent DC recommendations.     Equipment Recommendations  None recommended by PT     Recommendations for Other Services       Precautions / Restrictions Precautions Precautions: Fall;Cervical Precaution Booklet Issued: No Required Braces or Orthoses: Cervical Brace Cervical Brace: Hard collar Restrictions Weight Bearing Restrictions: No    Mobility  Bed Mobility Overal bed mobility: Modified Independent             General bed mobility comments: not performed    Transfers Overall transfer level: Needs assistance Equipment used: Rolling walker (2 wheeled) Transfers: Sit to/from Stand Sit to Stand: Min guard Stand pivot transfers: Min guard (need safety, does not fully turn prior to attempted rotation+sitting)       General transfer comment: heavy UE use and forward swing momentum strategy, clears buttocks fully and locks knee prior to trunk extension c BUE on RW.  Ambulation/Gait Ambulation/Gait assistance: Min assist;Min guard Gait Distance (Feet): 120 Feet Assistive device: Rolling walker (2 wheeled) Gait Pattern/deviations: Ataxic     General Gait Details: has better control of RW when cued to maintain safe distance and keep steps small. BLE weakness leads to increased forward flexion for gravity assisted swing phase bilat. After ~143ft BLE weakness is noticibly worse and concerning for safety.   Stairs Stairs: Yes Stairs assistance: Min guard Stair Management: Two rails;Step to pattern Number of Stairs: 4 General stair comments: comes down with LLE first due to LLE being weaker than Right. Will need to retrain performing a single step with RW support for BUE.   Wheelchair Mobility    Modified Rankin (Stroke Patients Only)  Balance Overall balance assessment: Needs assistance Sitting-balance support: No upper extremity supported;Feet supported Sitting balance-Leahy Scale: Good     Standing balance support: Bilateral upper extremity supported;During functional activity Standing balance-Leahy Scale: Poor Standing balance  comment: Pt reports 2 falls in previous week, no falls prior to that                            Cognition Arousal/Alertness: Awake/alert Behavior During Therapy: WFL for tasks assessed/performed Overall Cognitive Status: Within Functional Limits for tasks assessed                                        Exercises Other Exercises Other Exercises: Educ re: role of OT, POC, safe use of DME. Bed mobility, transfers, UB and LB dressing, feeding.    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   PT Goals (current goals can now be found in the care plan section) Acute Rehab PT Goals Patient Stated Goal: to be active again and get back to work PT Goal Formulation: With patient Time For Goal Achievement: 12/11/20 Potential to Achieve Goals: Good Progress towards PT goals: Progressing toward goals    Frequency    7X/week      PT Plan Discharge plan needs to be updated;Current plan remains appropriate    Co-evaluation              AM-PAC PT "6 Clicks" Mobility   Outcome Measure  Help needed turning from your back to your side while in a flat bed without using bedrails?: None Help needed moving from lying on your back to sitting on the side of a flat bed without using bedrails?: None Help needed moving to and from a bed to a chair (including a wheelchair)?: A Lot Help needed standing up from a chair using your arms (e.g., wheelchair or bedside chair)?:  A Lot Help needed to walk in hospital room?: A Lot Help needed climbing 3-5 steps with a railing? : A Lot 6 Click Score: 16    End of Session Equipment Utilized During Treatment: Gait belt;Cervical collar Activity Tolerance: Patient tolerated treatment well;Patient limited by fatigue Patient left: in chair;with call bell/phone within reach;Other (comment) Nurse Communication: Mobility status PT Visit Diagnosis: Unsteadiness on feet (R26.81);Difficulty in walking, not elsewhere classified (R26.2);Other abnormalities of gait and mobility (R26.89);Muscle weakness (generalized) (M62.81);Ataxic gait (R26.0);Other symptoms and signs involving the nervous system (R29.898)     Time: 2595-6387 PT Time Calculation (min) (ACUTE ONLY): 25 min  Charges:  $Gait Training: 23-37 mins                     11:49 AM, 11/28/20 Etta Grandchild, PT, DPT Physical Therapist - [facility]  [phone] ([place])    Valerya Maxton C 11/28/2020, 11:45 AM

## 2020-11-28 NOTE — Progress Notes (Addendum)
PROGRESS NOTE    Douglas Lyons  [facility]:[id number] DOB: 01-22-1946 DOA: 11/26/2020 PCP: Sallee Lange, NP      Brief Narrative:   From admission hpi: Douglas Lyons is a 75 y.o. male with medical history significant for diabetes mellitus, GERD, hypertension, dyslipidemia who presents to the ER for evaluation of numbness and tingling which initially started in his left fingertips about a month ago but has now progressed to weakness of his left upper extremity as well as some numbness and tingling in his right hand.  He also notes that he has been unable to ambulate for about 4 days.  He has some weakness in his left leg which he states his primary care provider noted during his last visit but now he is unable to ambulate because he feels his legs are unsteady and that he would fall. He does not have a known diagnosis of neuropathy and denies having any low back pain.  He denies having any urinary or fecal incontinence, no recent trauma, no falls or saddle anesthesia. He denies having any chest pain, no shortness of breath, no nausea, no vomiting, no urinary frequency, no dysuria, no hematuria, no headache, no constipation, no diarrhea, no palpitations, no diaphoresis, no cough, no fever, no chills. Labs show sodium 137, potassium 4.0, chloride 103, bicarb 25 glucose 150, BUN 16, creatinine 0.69, calcium 9.2, alkaline phosphatase 58, albumin 4.2, AST 12, ALT 12, total protein 7.1, white count 6.5, hemoglobin 14.4, hematocrit 43.3, MCV 89.9, RDW 33, platelet count 12.9, platelet count 214, PT 13.1, INR 1.0 Respiratory viral panel is negative CT scan of cervical spine no acute fracture, malalignment or bony lesion. Multilevel cervical spondylosis and right worse than left facet arthropathy. Please see MRI of the cervical spine obtained earlier today for further detail of underlying neural impingement. MRI of cervical spine shows degenerative disc osteophyte at C3-4 with resultant severe spinal  stenosis and flattening of the cervical spinal cord. 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 Additional degenerative spondylosis at C4-5 and C5-6 with resultant moderate spinal stenosis. Multifactorial degenerative changes with resultant multilevel foraminal narrowing as above. Notable findings include moderate left C3 foraminal stenosis, with severe bilateral C4, C5, C6, and left C7 foraminal narrowing. Reactive marrow edema about the right C3-4 facet due to facet arthritis. Finding could serve as a source for neck pain. MRI of the brain without contrast is negative brain MRI for age. No acute intracranial abnormality identified. 2.7 cm sclerotic lesion involving the left calvarium, also seen on prior head CT. Again, further evaluation with non emergent outpatient bone scan recommended for further evaluation.   Assessment & Plan:   Principal Problem:   Stenosis of cervical spine with myelopathy (HCC) Active Problems:   Gait instability   Diabetes mellitus, type 2 (HCC)   Hypertension   GERD (gastroesophageal reflux disease)   # Cervical stenosis # Cervical myelopathy Stenosis of cervical spine with myelopathy Several weeks progressive meylopathic symptoms (cervical radiculopathy, gait dysfunction). Severe spinal stenosis seen at c3/4 with signal abnormality consistent w/ compressive myelopathy. Has been seen by neurosurgery with tentative plans for ACDF on 4/8. - continue [place] J collar - PT consulted - monitor for signs progression - hold home aspirin  # Hypertension Here bp severely elevated. Neuro note advises permissive htn - goal bp <160/110 - will increase home enalapril from 5 to 10 mg daily - oral hydral prn for severe bp elevations   # T2DM fastings mildly above goal. Recent a1c  6.6.  - hold home glipizide - cont home metformin, monitor kidney function - continue statin, hold aspirin in anticipation of upcoming surgery - daily  fasting sugars    DVT prophylaxis: lovenox Code Status: full Family Communication: none at bedside  Level of care: Med-Surg Status is: Inpatient  Remains inpatient appropriate because:Inpatient level of care appropriate due to severity of illness   Dispo: The patient is from: Home              Anticipated d/c is to: tbd              Patient currently is not medically stable to d/c.   Difficult to place patient No        Consultants:  neurosurgery  Procedures: none  Antimicrobials:  none    Subjective: This morning feeling well. No new weakness or new gait dysfunction. Tolerating diet.  Objective: Vitals:   11/27/20 1036 11/27/20 1554 11/28/20 0433 11/28/20 0749  BP: (!) 174/75 136/79 (!) 168/79 (!) 182/81  Pulse: (!) 54 70 62 60  Resp: 16 16 17 14   Temp: 97.8 F (36.6 C) 98.2 F (36.8 C) 97.7 F (36.5 C) 98.7 F (37.1 C)  TempSrc:      SpO2: 99%  96% 97%  Weight:      Height:        Intake/Output Summary (Last 24 hours) at 11/28/2020 0811 Last data filed at 11/28/2020 0431 Gross per 24 hour  Intake --  Output 825 ml  Net -825 ml   Filed Weights   11/26/20 1846  Weight: 95.7 kg    Examination:  General exam: Appears calm and comfortable. In cervical collar Respiratory system: Clear to auscultation. Respiratory effort normal. Cardiovascular system: S1 & S2 heard, RRR. No JVD, murmurs, rubs, gallops or clicks. No pedal edema. Gastrointestinal system: Abdomen is nondistended, soft and nontender. No organomegaly or masses felt. Normal bowel sounds heard. Central nervous system: Alert and oriented. Numbness b/l fingers L>right. 4+/5 strength LEs Skin: No rashes, lesions or ulcers Psychiatry: Judgement and insight appear normal. Mood & affect appropriate.     Data Reviewed: I have personally reviewed following labs and imaging studies  CBC: Recent Labs  Lab 11/26/20 1849 11/28/20 0514  WBC 6.5 4.9  NEUTROABS 4.7  --   HGB 14.4 14.1  HCT  43.3 40.9  MCV 89.1 87.2  PLT 214 976   Basic Metabolic Panel: Recent Labs  Lab 11/26/20 1849 11/28/20 0514  NA 137 136  K 4.0 4.0  CL 103 105  CO2 25 25  GLUCOSE 150* 150*  BUN 16 15  CREATININE 0.69 0.74  CALCIUM 9.2 8.9   GFR: Estimated Creatinine Clearance: 91.2 mL/min (by C-G formula based on SCr of 0.74 mg/dL). Liver Function Tests: Recent Labs  Lab 11/26/20 1849  AST 12*  ALT 12  ALKPHOS 58  BILITOT 0.8  PROT 7.1  ALBUMIN 4.2   No results for input(s): LIPASE, AMYLASE in the last 168 hours. No results for input(s): AMMONIA in the last 168 hours. Coagulation Profile: Recent Labs  Lab 11/26/20 1849  INR 1.0   Cardiac Enzymes: No results for input(s): CKTOTAL, CKMB, CKMBINDEX, TROPONINI in the last 168 hours. BNP (last 3 results) No results for input(s): PROBNP in the last 8760 hours. HbA1C: Recent Labs    11/26/20 1849  HGBA1C 6.6*   CBG: Recent Labs  Lab 11/26/20 1851 11/27/20 1222 11/27/20 1717 11/27/20 2053 11/28/20 0752  GLUCAP 154* 184* 151* 159* 129*  Lipid Profile: No results for input(s): CHOL, HDL, LDLCALC, TRIG, CHOLHDL, LDLDIRECT in the last 72 hours. Thyroid Function Tests: No results for input(s): TSH, T4TOTAL, FREET4, T3FREE, THYROIDAB in the last 72 hours. Anemia Panel: No results for input(s): VITAMINB12, FOLATE, FERRITIN, TIBC, IRON, RETICCTPCT in the last 72 hours. Urine analysis: No results found for: COLORURINE, APPEARANCEUR, LABSPEC, PHURINE, GLUCOSEU, HGBUR, BILIRUBINUR, KETONESUR, PROTEINUR, UROBILINOGEN, NITRITE, LEUKOCYTESUR Sepsis Labs: @LABRCNTIP (procalcitonin:4,lacticidven:4)  ) Recent Results (from the past 240 hour(s))  Resp Panel by RT-PCR (Flu A&B, Covid) Nasopharyngeal Swab     Status: None   Collection Time: 11/27/20  2:33 AM   Specimen: Nasopharyngeal Swab; Nasopharyngeal(NP) swabs in vial transport medium  Result Value Ref Range Status   SARS Coronavirus 2 by RT PCR NEGATIVE NEGATIVE Final     Comment: (NOTE) SARS-CoV-2 target nucleic acids are NOT DETECTED.  The SARS-CoV-2 RNA is generally detectable in upper respiratory specimens during the acute phase of infection. The lowest concentration of SARS-CoV-2 viral copies this assay can detect is 138 copies/mL. A negative result does not preclude SARS-Cov-2 infection and should not be used as the sole basis for treatment or other patient management decisions. A negative result may occur with  improper specimen collection/handling, submission of specimen other than nasopharyngeal swab, presence of viral mutation(s) within the areas targeted by this assay, and inadequate number of viral copies(<138 copies/mL). A negative result must be combined with clinical observations, patient history, and epidemiological information. The expected result is Negative.  Fact Sheet for Patients:  EntrepreneurPulse.com.au  Fact Sheet for Healthcare Providers:  IncredibleEmployment.be  This test is no t yet approved or cleared by the [place] FDA and  has been authorized for detection and/or diagnosis of SARS-CoV-2 by FDA under an Emergency Use Authorization (EUA). This EUA will remain  in effect (meaning this test can be used) for the duration of the COVID-19 declaration under Section 564(b)(1) of the Act, 21 U.S.C.section 360bbb-3(b)(1), unless the authorization is terminated  or revoked sooner.       Influenza A by PCR NEGATIVE NEGATIVE Final   Influenza B by PCR NEGATIVE NEGATIVE Final    Comment: (NOTE) The Xpert Xpress SARS-CoV-2/FLU/RSV plus assay is intended as an aid in the diagnosis of influenza from Nasopharyngeal swab specimens and should not be used as a sole basis for treatment. Nasal washings and aspirates are unacceptable for Xpert Xpress SARS-CoV-2/FLU/RSV testing.  Fact Sheet for Patients: EntrepreneurPulse.com.au  Fact Sheet for Healthcare  Providers: IncredibleEmployment.be  This test is not yet approved or cleared by the [place] FDA and has been authorized for detection and/or diagnosis of SARS-CoV-2 by FDA under an Emergency Use Authorization (EUA). This EUA will remain in effect (meaning this test can be used) for the duration of the COVID-19 declaration under Section 564(b)(1) of the Act, 21 U.S.C. section 360bbb-3(b)(1), unless the authorization is terminated or revoked.  Performed at [facility], [address] [place], [place] [address]          Radiology Studies: CT HEAD WO CONTRAST  Result Date: 11/26/2020 CLINICAL DATA:  Neuro deficit. Acute stroke suspected. Bilateral high on numbness for 2 weeks. EXAM: CT HEAD WITHOUT CONTRAST TECHNIQUE: Contiguous axial images were obtained from the base of the skull through the vertex without intravenous contrast. COMPARISON:  None. FINDINGS: Brain: No subdural, epidural, or subarachnoid hemorrhage identified. No mass effect or midline shift. Ventricles and sulci are unremarkable. Cerebellum, brainstem, and basal cisterns are normal. No acute cortical ischemia or infarct. Vascular:  Calcified atherosclerosis in the intracranial carotids. Skull: Sclerosis is seen in the lateral calvarium as seen on series 3, image 48 no other abnormalities. Sinuses/Orbits: Fluid in the inferior left mastoid air cells identified without evidence of fracture or erosion. Mastoid air cells and middle ears are otherwise unremarkable. Other: None. IMPRESSION: 1. No acute intracranial abnormalities identified. 2. Sclerosis seen in the lateral calvarium as seen on series 3, image 48 is nonspecific. Recommend a bone scan as an outpatient for further evaluation. Electronically Signed   By: Dorise Bullion III M.D   On: 11/26/2020 19:41   CT Cervical Spine Wo Contrast  Result Date: 11/27/2020 CLINICAL DATA:  Spinal stenosis EXAM: CT CERVICAL SPINE WITHOUT CONTRAST  TECHNIQUE: Multidetector CT imaging of the cervical spine was performed without intravenous contrast. Multiplanar CT image reconstructions were also generated. COMPARISON:  MRI cervical spine obtained earlier today FINDINGS: Alignment: Normal. Skull base and vertebrae: No acute fracture. No primary bone lesion or focal pathologic process. Soft tissues and spinal canal: No prevertebral fluid or swelling. No visible canal hematoma. Disc levels: Multilevel cervical spondylosis and bilateral right greater than left facet arthropathy. Upper chest: Left apical pleuroparenchymal scarring. Other: None. IMPRESSION: 1. No acute fracture, malalignment or bony lesion. 2. Multilevel cervical spondylosis and right worse than left facet arthropathy. Please see MRI of the cervical spine obtained earlier today for further detail of underlying neural impingement. Electronically Signed   By: Jacqulynn Cadet M.D.   On: 11/27/2020 06:07   MR BRAIN WO CONTRAST  Result Date: 11/27/2020 CLINICAL DATA:  Initial evaluation for neuro deficit, stroke suspected. Bilateral arm weakness and numbness. EXAM: MRI HEAD WITHOUT CONTRAST MRI CERVICAL SPINE WITHOUT CONTRAST TECHNIQUE: Multiplanar, multiecho pulse sequences of the brain and surrounding structures, and cervical spine, to include the craniocervical junction and cervicothoracic junction, were obtained without intravenous contrast. COMPARISON:  Prior CT from 11/26/2020 FINDINGS: MRI HEAD FINDINGS Brain: Generalized age-related cerebral atrophy. No significant cerebral white matter disease for age. No abnormal foci of restricted diffusion to suggest acute or subacute ischemia. Gray-white matter differentiation maintained. No encephalomalacia to suggest chronic cortical infarction. No foci of susceptibility artifact to suggest acute or chronic intracranial hemorrhage. No mass lesion, midline shift or mass effect. No hydrocephalus or extra-axial fluid collection. Pituitary gland  suprasellar region within normal limits. Midline structures intact. Vascular: Major intracranial vascular flow voids are maintained at the skull base. Skull and upper cervical spine: Craniocervical junction within normal limits. Bone marrow signal intensity normal. Note made of a 2.7 cm T2 hypointense sclerotic lesion at the left calvarium, also seen on prior CT. No other focal marrow replacing lesion. Sinuses/Orbits: Globes and orbital soft tissues demonstrate no acute finding. Paranasal sinuses are clear. Small bilateral mastoid effusions noted, of doubtful significance. Inner ear structures grossly normal. Visualized nasopharynx within normal limits. Other: None. MRI CERVICAL SPINE FINDINGS Alignment: Vertebral bodies normally aligned with preservation of the normal cervical lordosis. No listhesis. Vertebrae: Vertebral body height maintained without acute or chronic fracture. Bone marrow signal intensity within normal limits. No discrete or worrisome osseous lesions. Discogenic reactive endplate change noted about the C3-4 interspace. Reactive marrow edema noted about the right C3-4 facet as well due to facet arthritis. No other abnormal marrow edema. Cord: Patchy signal abnormality seen involving the left greater than right cord at the level of C3-4, consistent with compressive myelopathy (series 10, image 10). Signal intensity within the cervical spinal cord otherwise within normal limits. Posterior Fossa, vertebral arteries, paraspinal tissues: Craniocervical junction normal.  Paraspinous and prevertebral soft tissues normal. Normal flow voids seen within the vertebral arteries bilaterally. Disc levels: C2-C3: Disc bulge with bilateral uncovertebral hypertrophy. No spinal stenosis. Moderate left C3 foraminal narrowing. Right neural foramen remains patent. C3-C4: Degenerative intervertebral disc space narrowing with diffuse disc osteophyte complex. Broad posterior component indents and effaces the ventral thecal  sac, contacting and flattening the cervical spinal cord. Associated cord signal changes consistent with myelopathy. Superimposed severe right-sided facet degeneration. Resultant severe spinal stenosis with the thecal sac measuring 3 mm in AP diameter at its most narrow point. Severe bilateral C4 foraminal narrowing. C4-C5: Degenerative intervertebral disc space narrowing with diffuse disc osteophyte complex. Broad central to left paracentral posterior component indents and partially faces the ventral thecal sac (series 10, image 14). Well cord flattening without cord signal changes. Moderate spinal stenosis. Severe left worse than right C5 foraminal stenosis. C5-C6: Diffuse disc bulge with bilateral uncovertebral hypertrophy. Right worse than left facet degeneration. Resultant mild to moderate spinal stenosis without cord impingement. Severe right worse than left C6 foraminal narrowing. C6-C7: Diffuse disc bulge with bilateral uncovertebral hypertrophy. Flattening of the ventral thecal sac with resultant mild spinal stenosis. No cord impingement. Severe left with moderate right C7 foraminal stenosis. C7-T1: Negative interspace. Left worse than right facet degeneration. No spinal stenosis. Foramina remain patent. Visualized upper thoracic spine demonstrates no significant finding. IMPRESSION: MRI HEAD IMPRESSION: 1. Negative brain MRI for age. No acute intracranial abnormality identified. 2. 2.7 cm sclerotic lesion involving the left calvarium, also seen on prior head CT. Again, further evaluation with nonemergent outpatient bone scan recommended for further evaluation. MRI CERVICAL SPINE IMPRESSION: 1. Degenerative disc osteophyte at C3-4 with resultant severe spinal stenosis and flattening of the cervical spinal cord. 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 2. Additional degenerative spondylosis at C4-5 and C5-6 with resultant moderate spinal stenosis. 3. Multifactorial  degenerative changes with resultant multilevel foraminal narrowing as above. Notable findings include moderate left C3 foraminal stenosis, with severe bilateral C4, C5, C6, and left C7 foraminal narrowing. 4. Reactive marrow edema about the right C3-4 facet due to facet arthritis. Finding could serve as a source for neck pain. Electronically Signed   By: Jeannine Boga M.D.   On: 11/27/2020 02:06   MR Cervical Spine Wo Contrast  Result Date: 11/27/2020 CLINICAL DATA:  Initial evaluation for neuro deficit, stroke suspected. Bilateral arm weakness and numbness. EXAM: MRI HEAD WITHOUT CONTRAST MRI CERVICAL SPINE WITHOUT CONTRAST TECHNIQUE: Multiplanar, multiecho pulse sequences of the brain and surrounding structures, and cervical spine, to include the craniocervical junction and cervicothoracic junction, were obtained without intravenous contrast. COMPARISON:  Prior CT from 11/26/2020 FINDINGS: MRI HEAD FINDINGS Brain: Generalized age-related cerebral atrophy. No significant cerebral white matter disease for age. No abnormal foci of restricted diffusion to suggest acute or subacute ischemia. Gray-white matter differentiation maintained. No encephalomalacia to suggest chronic cortical infarction. No foci of susceptibility artifact to suggest acute or chronic intracranial hemorrhage. No mass lesion, midline shift or mass effect. No hydrocephalus or extra-axial fluid collection. Pituitary gland suprasellar region within normal limits. Midline structures intact. Vascular: Major intracranial vascular flow voids are maintained at the skull base. Skull and upper cervical spine: Craniocervical junction within normal limits. Bone marrow signal intensity normal. Note made of a 2.7 cm T2 hypointense sclerotic lesion at the left calvarium, also seen on prior CT. No other focal marrow replacing lesion. Sinuses/Orbits: Globes and orbital soft tissues demonstrate no acute finding. Paranasal sinuses are clear. Small  bilateral  mastoid effusions noted, of doubtful significance. Inner ear structures grossly normal. Visualized nasopharynx within normal limits. Other: None. MRI CERVICAL SPINE FINDINGS Alignment: Vertebral bodies normally aligned with preservation of the normal cervical lordosis. No listhesis. Vertebrae: Vertebral body height maintained without acute or chronic fracture. Bone marrow signal intensity within normal limits. No discrete or worrisome osseous lesions. Discogenic reactive endplate change noted about the C3-4 interspace. Reactive marrow edema noted about the right C3-4 facet as well due to facet arthritis. No other abnormal marrow edema. Cord: Patchy signal abnormality seen involving the left greater than right cord at the level of C3-4, consistent with compressive myelopathy (series 10, image 10). Signal intensity within the cervical spinal cord otherwise within normal limits. Posterior Fossa, vertebral arteries, paraspinal tissues: Craniocervical junction normal. Paraspinous and prevertebral soft tissues normal. Normal flow voids seen within the vertebral arteries bilaterally. Disc levels: C2-C3: Disc bulge with bilateral uncovertebral hypertrophy. No spinal stenosis. Moderate left C3 foraminal narrowing. Right neural foramen remains patent. C3-C4: Degenerative intervertebral disc space narrowing with diffuse disc osteophyte complex. Broad posterior component indents and effaces the ventral thecal sac, contacting and flattening the cervical spinal cord. Associated cord signal changes consistent with myelopathy. Superimposed severe right-sided facet degeneration. Resultant severe spinal stenosis with the thecal sac measuring 3 mm in AP diameter at its most narrow point. Severe bilateral C4 foraminal narrowing. C4-C5: Degenerative intervertebral disc space narrowing with diffuse disc osteophyte complex. Broad central to left paracentral posterior component indents and partially faces the ventral thecal sac  (series 10, image 14). Well cord flattening without cord signal changes. Moderate spinal stenosis. Severe left worse than right C5 foraminal stenosis. C5-C6: Diffuse disc bulge with bilateral uncovertebral hypertrophy. Right worse than left facet degeneration. Resultant mild to moderate spinal stenosis without cord impingement. Severe right worse than left C6 foraminal narrowing. C6-C7: Diffuse disc bulge with bilateral uncovertebral hypertrophy. Flattening of the ventral thecal sac with resultant mild spinal stenosis. No cord impingement. Severe left with moderate right C7 foraminal stenosis. C7-T1: Negative interspace. Left worse than right facet degeneration. No spinal stenosis. Foramina remain patent. Visualized upper thoracic spine demonstrates no significant finding. IMPRESSION: MRI HEAD IMPRESSION: 1. Negative brain MRI for age. No acute intracranial abnormality identified. 2. 2.7 cm sclerotic lesion involving the left calvarium, also seen on prior head CT. Again, further evaluation with nonemergent outpatient bone scan recommended for further evaluation. MRI CERVICAL SPINE IMPRESSION: 1. Degenerative disc osteophyte at C3-4 with resultant severe spinal stenosis and flattening of the cervical spinal cord. 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 2. Additional degenerative spondylosis at C4-5 and C5-6 with resultant moderate spinal stenosis. 3. Multifactorial degenerative changes with resultant multilevel foraminal narrowing as above. Notable findings include moderate left C3 foraminal stenosis, with severe bilateral C4, C5, C6, and left C7 foraminal narrowing. 4. Reactive marrow edema about the right C3-4 facet due to facet arthritis. Finding could serve as a source for neck pain. Electronically Signed   By: Jeannine Boga M.D.   On: 11/27/2020 02:06        Scheduled Meds: . aspirin EC  81 mg Oral Daily  . cholecalciferol  1,000 Units Oral Daily  . enalapril   5 mg Oral Daily  . enoxaparin (LOVENOX) injection  40 mg Subcutaneous Q24H  . insulin aspart  0-15 Units Subcutaneous TID WC  . rosuvastatin  20 mg Oral Daily   Continuous Infusions:   LOS: 1 day    Time spent: 45 min    Olen Cordial  Manfred Arch, MD [facility]   If 7PM-7AM, please contact night-coverage [email or web address] Password [email or web address] 11/28/2020, 8:11 AM

## 2020-11-28 NOTE — Progress Notes (Signed)
Met with the patient at the bedside and discussed DC plan and needs He lives at home with his wife, he has a rolling walker at home and will borrow a rolator from his brother, he is not sure if he wants Home health PT or not.  He is to have surgery to fuse his spine on Friday, will continue to monitor for needs and check back about wanting HH.

## 2020-11-28 NOTE — Progress Notes (Signed)
   11/28/20 1505  What Happened  Was fall witnessed? Yes  Who witnessed fall? Samantha NT  Patients activity before fall ambulating-assisted;to/from bed, chair, or stretcher  Point of contact buttocks (pt assisted to floor no LOC, no head trauma)  Was patient injured? No  Follow Up  MD notified Dr Si Raider at 15:20  Time MD notified 1520  Additional tests No  Simple treatment Other (comment) (none)  Progress note created (see row info) Yes  Adult Fall Risk Assessment  Risk Factor Category (scoring not indicated) History of more than one fall within 6 months before admission (document High fall risk)  Patient Fall Risk Level High fall risk  Adult Fall Risk Interventions  Required Bundle Interventions *See Row Information* High fall risk - low, moderate, and high requirements implemented  Additional Interventions Use of appropriate toileting equipment (bedpan, BSC, etc.)  Screening for Fall Injury Risk (To be completed on HIGH fall risk patients) - Assessing Need for Floor Mats  Risk For Fall Injury- Criteria for Floor Mats Previous fall this admission  Will Implement Floor Mats Yes

## 2020-11-28 NOTE — Evaluation (Signed)
Occupational Therapy Evaluation Patient Details Name: Douglas Lyons MRN: [id number] DOB: [birth year] Today's Date: 11/28/2020    History of Present Illness Douglas Lyons is a 34yoM who comes to [facility] on 11/26/20 with over 3 weeks of worsening numbness in his hands bilaterally, left greater than right.  He additionally states he is had some difficulty with balance.  He is right-handed but has noted some decrease in strength over the left hand and arm.  The numbness does appear to travel up the arm towards the shoulder.  On the right side, he remains in the hand.  Overall, he does feel like this is worsening.  He does not report any inciting event and denies any falls or traumatic incidents prior to the onset. 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   Clinical Impression   Douglas Lyons presents today with generalized weakness, reduced endurance, and impaired balance, limiting his ability to engage in functional mobility tasks. Until 1 month ago, pt was IND in all ADL/IADL, active, driving, working full time in a  physically demanding job. He reports increasing numbness and weakness during the prior month, with 2 falls in the past week. He displays weakness in all 4 limbs (more severe on L side) and impaired sensation in bilateral UE (again more significant on L side), with complete lack of sensation in L hand. Currently he is unable to don and button a shirt, cannot stand for grooming activities, can use only his R hand for holding utensils and dialing telephone. He requires several attempts to come into standing, 2/2 weakness in LE, displays poor balance, and is able to remain in standing for <1 minute. Pt requires VCs for correct hand placement and use of RW, and close supervision for safety in transfers and standing. Douglas Lyons will benefit from  ongoing OT while hospitalized with [place] likely post DC. We will re-evaluate  pt's ongoing needs following surgery on 12/01/2020.    Follow Up Recommendations  Home health OT    Equipment Recommendations  None recommended by OT    Recommendations for Other Services       Precautions / Restrictions Precautions Precautions: Fall;Cervical Required Braces or Orthoses: Cervical Brace Cervical Brace: Hard collar Restrictions Weight Bearing Restrictions: No      Mobility Bed Mobility Overal bed mobility: Modified Independent                  Transfers Overall transfer level: Needs assistance Equipment used: Rolling walker (2 wheeled) Transfers: Sit to/from [facility] Sit to Stand: Min assist Stand pivot transfers: Min guard       General transfer comment: Requires extended time and 2 attempts to go sit<stand, w/ elevated bed and RW. Requires VC for hand placement on RW.    Balance Overall balance assessment: Needs assistance Sitting-balance support: No upper extremity supported;Feet supported Sitting balance-Leahy Scale: Good     Standing balance support: Bilateral upper extremity supported;During functional activity Standing balance-Leahy Scale: Poor Standing balance comment: Pt reports 2 falls in previous week, no falls prior to that                           ADL either performed or assessed with clinical judgement   ADL Overall ADL's : Needs assistance/impaired Eating/Feeding: Independent Eating/Feeding Details (indicate cue type and  reason): at present can only hold utensils in RUE, 2/2 numbness in L hand             Upper Body Dressing : Moderate assistance Upper Body Dressing Details (indicate cue type and reason): unable to don shirt, button buttons Lower Body Dressing: Supervision/safety                 General ADL Comments: pt reports that for ~ 1 month he has been significantly off his baseline ADL performance      Vision Baseline Vision/History: Wears glasses Wears Glasses: At all times Patient Visual Report: No change from baseline       Perception     Praxis      Pertinent Vitals/Pain Pain Assessment: No/denies pain     Hand Dominance Right   Extremity/Trunk Assessment Upper Extremity Assessment Upper Extremity Assessment: Generalized weakness (L UE weaker than R)   Lower Extremity Assessment Lower Extremity Assessment: Generalized weakness (L LE weaker than R)   Cervical / Trunk Assessment Cervical / Trunk Assessment: Normal   Communication Communication Communication: No difficulties   Cognition Arousal/Alertness: Awake/alert Behavior During Therapy: WFL for tasks assessed/performed Overall Cognitive Status: Within Functional Limits for tasks assessed                                     General Comments       Exercises Other Exercises Other Exercises: Educ re: role of OT, POC, safe use of DME. Bed mobility, transfers, UB and LB dressing, feeding.   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        OT Problem List: Decreased strength;Decreased range of motion;Decreased activity tolerance;Impaired balance (sitting and/or standing);Impaired sensation;Decreased knowledge of use of DME or AE      OT Treatment/Interventions: Self-care/ADL training;Therapeutic  exercise;Patient/family education;Balance training;Therapeutic activities;DME and/or AE instruction    OT Goals(Current goals can be found in the care plan section) Acute Rehab OT Goals Patient Stated Goal: to be active again and get back to work OT Goal Formulation: With patient Time For Goal Achievement: 12/12/20 Potential to Achieve Goals: Good ADL Goals Pt Will Perform Grooming: with modified independence;standing (standing at sink for 5+ minutes) Pt Will Perform Upper Body Dressing: with min assist;Independently (putting on button-front shirt and manipulating buttons) Pt Will Transfer to Toilet: with supervision (using LRAD)  OT Frequency: Min 1X/week   Barriers to D/C:            Co-evaluation              AM-PAC OT "6 Clicks" Daily Activity     Outcome Measure Help from another person eating meals?: None Help from another person taking care of personal grooming?: A Little Help from another person toileting, which includes using toliet, bedpan, or urinal?: A Lot Help from another person bathing (including washing,  rinsing, drying)?: A Lot Help from another person to put on and taking off regular upper body clothing?: A Lot Help from another person to put on and taking off regular lower body clothing?: A Little 6 Click Score: 16   End of Session Equipment Utilized During Treatment: Rolling walker;Cervical collar Nurse Communication: Mobility status  Activity Tolerance: Patient tolerated treatment well Patient left: in chair;with call bell/phone within reach;with nursing/sitter in room  OT Visit Diagnosis: Unsteadiness on feet (R26.81);Repeated falls (R29.6);Other abnormalities of gait and mobility (R26.89)                Time: 7096-4383 OT Time Calculation (min): 38 min Charges:  OT General Charges $OT Visit: 1 Visit OT Evaluation $OT Eval Low Complexity: 1 Low OT Treatments $Self Care/Home Management : 23-37 mins  Josiah Lobo, PhD, MS, OTR/L 11/28/20,  10:43 AM

## 2020-11-29 ENCOUNTER — Inpatient Hospital Stay: Admit: 2020-11-29 | Payer: Medicare Other

## 2020-11-29 DIAGNOSIS — I10 Essential (primary) hypertension: Secondary | ICD-10-CM

## 2020-11-29 DIAGNOSIS — K219 Gastro-esophageal reflux disease without esophagitis: Secondary | ICD-10-CM | POA: Diagnosis not present

## 2020-11-29 DIAGNOSIS — G992 Myelopathy in diseases classified elsewhere: Secondary | ICD-10-CM | POA: Diagnosis not present

## 2020-11-29 DIAGNOSIS — M4802 Spinal stenosis, cervical region: Secondary | ICD-10-CM | POA: Diagnosis not present

## 2020-11-29 LAB — GLUCOSE, CAPILLARY
Glucose-Capillary: 158 mg/dL — ABNORMAL HIGH (ref 70–99)
Glucose-Capillary: 190 mg/dL — ABNORMAL HIGH (ref 70–99)
Glucose-Capillary: 192 mg/dL — ABNORMAL HIGH (ref 70–99)
Glucose-Capillary: 268 mg/dL — ABNORMAL HIGH (ref 70–99)

## 2020-11-29 LAB — BASIC METABOLIC PANEL
Anion gap: 8 (ref 5–15)
BUN: 19 mg/dL (ref 8–23)
CO2: 26 mmol/L (ref 22–32)
Calcium: 8.8 mg/dL — ABNORMAL LOW (ref 8.9–10.3)
Chloride: 102 mmol/L (ref 98–111)
Creatinine, Ser: 0.78 mg/dL (ref 0.61–1.24)
GFR, Estimated: >60 mL/min (ref >60)
Glucose, Bld: 143 mg/dL — ABNORMAL HIGH (ref 70–99)
Potassium: 4.1 mmol/L (ref 3.5–5.1)
Sodium: 136 mmol/L (ref 135–145)

## 2020-11-29 NOTE — Progress Notes (Signed)
Physical Therapy Treatment Patient Details Name: Douglas Lyons MRN: [id number] DOB: 1946-03-11 Today's Date: 11/29/2020    History of Present Illness Douglas Lyons is a 40yoM who comes to [facility] on 11/26/20 with over 3 weeks of worsening numbness in his hands bilaterally, left greater than right.  He additionally states he is had some difficulty with balance.  He is right-handed but has noted some decrease in strength over the left hand and arm.  The numbness does appear to travel up the arm towards the shoulder.  On the right side, he remains in the hand.  Overall, he does feel like this is worsening.  He does not report any inciting event and denies any falls or traumatic incidents prior to the onset. 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    PT Comments    Pt in bed upon entry visiting with his younger brother. Pt denies any new pain or other symptoms 2/2 to his fall to floor yesterday with nursing. Supervision level bed mobility, minGuard assist STS from elevated surface. Close minGuard assist for gait training with emphasis on safe turns with RW as pt struggles with axial and frontal movement postural control. Pt tolerates session well, stops after 4 laps due to legs growing weak. Pt lef tup in chair with meal tray presented.    Follow Up Recommendations  Supervision for mobility/OOB;Follow surgeon's recommendation for DC plan and follow-up therapies     Equipment Recommendations  None recommended by PT    Recommendations for Other Services       Precautions / Restrictions Precautions Precautions: Fall;Cervical Precaution Booklet Issued: No Required Braces or Orthoses: Cervical Brace Cervical Brace: Hard collar Restrictions Weight Bearing Restrictions: No    Mobility  Bed Mobility Overal bed mobility: Needs  Assistance Bed Mobility: Supine to Sit     Supine to sit: Supervision     General bed mobility comments: labored but unassisted    Transfers Overall transfer level: Needs assistance Equipment used: Rolling walker (2 wheeled) Transfers: Sit to/from Stand Sit to Stand: Supervision;From elevated surface         General transfer comment: no LOB or SOB  Ambulation/Gait Ambulation/Gait assistance: Min guard Gait Distance (Feet): 80 Feet Assistive device: Rolling walker (2 wheeled)       General Gait Details: AMB 2 figure 8s in room c using shoes are course markers. cues for small amplitude movements and safe RW use, no frank LOB in session.   Stairs             Wheelchair Mobility    Modified Rankin (Stroke Patients Only)       Balance Overall balance assessment: Needs assistance Sitting-balance support: No upper extremity supported;Feet supported Sitting balance-Leahy Scale: Good     Standing balance support: Bilateral upper extremity supported;During functional activity Standing balance-Leahy Scale: Poor Standing balance comment: Pt reports 2 falls in previous week, no falls prior to that; fall yesterday with nursing.                            Cognition Arousal/Alertness: Awake/alert Behavior During Therapy: WFL for tasks assessed/performed Overall Cognitive Status: Within Functional Limits for tasks assessed  Exercises      General Comments        Pertinent Vitals/Pain Pain Assessment: No/denies pain    Home Living                      Prior Function            PT Goals (current goals can now be found in the care plan section) Acute Rehab PT Goals Patient Stated Goal: to be active again and get back to work PT Goal Formulation: With patient Time For Goal Achievement: 12/11/20 Potential to Achieve Goals: Good Progress towards PT goals: Progressing toward  goals    Frequency    7X/week      PT Plan Discharge plan needs to be updated;Current plan remains appropriate    Co-evaluation              AM-PAC PT "6 Clicks" Mobility   Outcome Measure  Help needed turning from your back to your side while in a flat bed without using bedrails?: None Help needed moving from lying on your back to sitting on the side of a flat bed without using bedrails?: None Help needed moving to and from a bed to a chair (including a wheelchair)?: A Lot Help needed standing up from a chair using your arms (e.g., wheelchair or bedside chair)?: A Lot Help needed to walk in hospital room?: A Lot Help needed climbing 3-5 steps with a railing? : A Lot 6 Click Score: 16    End of Session Equipment Utilized During Treatment: Gait belt;Cervical collar Activity Tolerance: Patient tolerated treatment well;Patient limited by fatigue Patient left: in chair;with call bell/phone within reach;Other (comment) Nurse Communication: Mobility status PT Visit Diagnosis: Unsteadiness on feet (R26.81);Difficulty in walking, not elsewhere classified (R26.2);Other abnormalities of gait and mobility (R26.89);Muscle weakness (generalized) (M62.81);Ataxic gait (R26.0);Other symptoms and signs involving the nervous system (R29.898)     Time: 1115-1140 PT Time Calculation (min) (ACUTE ONLY): 25 min  Charges:  $Therapeutic Exercise: 8-22 mins $Neuromuscular Re-education: 8-22 mins                     12:56 PM, 11/29/20 Etta Grandchild, PT, DPT Physical Therapist - [facility]  [phone] ([place])    Annawan C 11/29/2020, 12:53 PM

## 2020-11-29 NOTE — Progress Notes (Signed)
PROGRESS NOTE    Douglas Lyons   XTG:[id number]  DOB: 1945/10/18  PCP: Sallee Lange, NP    DOA: 11/26/2020 LOS: 2   Brief Narrative   Douglas Lyons is a 75 y.o. male with medical history significant for diabetes mellitus, GERD, hypertension, dyslipidemia who presents to the ER for evaluation of numbness and tingling which initially started in his left fingertips about a month ago but has now progressed to weakness of his left upper extremity as well as some numbness and tingling in his right hand.  He also notes that he has been unable to ambulate for about 4 days with recently noted left leg weakness at PCP visit.   CT cervical spine no acute fracture, malalignment or bony lesion. Multilevel cervical spondylosis and right worse than left facet arthropathy.   MRI cervical spine shows degenerative disc osteophyte at C3-4 with resultant severe spinal stenosis and flattening of the cervical spinal cord. 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 (See full report for other findings.)  MRI brain without contrast negative for acute findings. A 2.7 cm sclerotic lesion involving the left calvarium, also seen on prior head CT.  Again, further evaluation with non emergent outpatient bone scan recommended for further evaluation.  Patient admitted to hospitalist service with neurosurgery consulted.    Plan is for surgery on Friday 12/01/20.   Assessment & Plan   Principal Problem:   Stenosis of cervical spine with myelopathy (HCC) Active Problems:   Gait instability   Diabetes mellitus, type 2 (HCC)   Hypertension   GERD (gastroesophageal reflux disease)   Cervical stenosis with secondary cervical myelopathy Several weeks progressive meylopathic symptoms (cervical radiculopathy, gait dysfunction). Severe spinal stenosis seen at C3-4 with signal abnormality consistent w/ compressive myelopathy.  --Neurosurgery following --Plan for ACDF on 4/8 with  Dr. Lacinda Axon --Continue [place] J collar --PT consulted, will need post-op assessment --Monitor for signs progression --Hold home aspirin  Hypertension -Continue home enalapril.  Hydralazine PRN  Type 2 Diabetes - controlled with recent a1c 6.6%.  --hold home glipizide --cont home metformin, monitor kidney function --continue statin --hold aspirin in anticipation of upcoming surgery --monitor CBG's and add sliding scale coverage if indicated   Obesity: Body mass index is 30.28 kg/m.  Complicates overall care and prognosis.  Recommend lifestyle modifications including physical activity and diet for weight loss and overall long-term health.   DVT prophylaxis: enoxaparin (LOVENOX) injection 40 mg Start: 11/27/20 2200   Diet:  Diet Orders (From admission, onward)    Start     Ordered   11/27/20 0938  Diet Carb Modified Fluid consistency: Thin; Room service appropriate? Yes  Diet effective now       Question Answer Comment  Diet-HS Snack? Nothing   Calorie Level Medium 1600-2000   Fluid consistency: Thin   Room service appropriate? Yes      11/27/20 0937            Code Status: Full Code    Subjective 11/29/20    Pt reported feeling overall well today.  Not having significant pain.  Continues to have numbness in hands/fingers but good grip.  No fever/chills, headache, N/V/D, cough, CP, SOB or other acute complaints.    Disposition Plan & Communication   Status is: Inpatient  Inpatient status remains appropriate due to severity of illness, patient requires neurosurgery which is planned for 4/8.  Dispo: The patient is from: Home  Anticipated d/c is to: Home              Patient currently not medically stable for discharge   Difficult to place patient now   Consults, Procedures, Significant Events   Consultants:   Neurosurgery  Procedures:   None  Antimicrobials:  Anti-infectives (From admission, onward)   None        Micro    Objective    Vitals:   11/29/20 0427 11/29/20 0801 11/29/20 1145 11/29/20 1609  BP: 139/78 (!) 151/74 130/77 125/67  Pulse: 63 65 70 80  Resp: 17 16 16 16   Temp: 98 F (36.7 C) 98 F (36.7 C) 98 F (36.7 C) 98.1 F (36.7 C)  TempSrc: Oral  Oral   SpO2: 96% 100%  100%  Weight:      Height:        Intake/Output Summary (Last 24 hours) at 11/29/2020 1732 Last data filed at 11/29/2020 0654 Gross per 24 hour  Intake 120 ml  Output 650 ml  Net -530 ml   Filed Weights   11/26/20 1846  Weight: 95.7 kg    Physical Exam:  General exam: awake, alert, no acute distress HEENT: Cervical collar in place, moist mucus membranes, hearing grossly normal  Respiratory system: CTAB, no wheezes, rales or rhonchi, normal respiratory effort. Cardiovascular system: normal S1/S2, RRR, no JVD, murmurs, rubs, gallops, no pedal edema.   Gastrointestinal system: soft, NT, ND,  Central nervous system: A&O x4. no gross focal neurologic deficits, normal speech Skin: dry, intact, normal temperature Psychiatry: normal mood, congruent affect, judgement and insight appear normal  Labs   Data Reviewed: I have personally reviewed following labs and imaging studies  CBC: Recent Labs  Lab 11/26/20 1849 11/28/20 0514  WBC 6.5 4.9  NEUTROABS 4.7  --   HGB 14.4 14.1  HCT 43.3 40.9  MCV 89.1 87.2  PLT 214 505   Basic Metabolic Panel: Recent Labs  Lab 11/26/20 1849 11/28/20 0514 11/29/20 0617  NA 137 136 136  K 4.0 4.0 4.1  CL 103 105 102  CO2 25 25 26   GLUCOSE 150* 150* 143*  BUN 16 15 19   CREATININE 0.69 0.74 0.78  CALCIUM 9.2 8.9 8.8*   GFR: Estimated Creatinine Clearance: 91.2 mL/min (by C-G formula based on SCr of 0.78 mg/dL). Liver Function Tests: Recent Labs  Lab 11/26/20 1849  AST 12*  ALT 12  ALKPHOS 58  BILITOT 0.8  PROT 7.1  ALBUMIN 4.2   No results for input(s): LIPASE, AMYLASE in the last 168 hours. No results for input(s): AMMONIA in the last 168 hours. Coagulation  Profile: Recent Labs  Lab 11/26/20 1849  INR 1.0   Cardiac Enzymes: No results for input(s): CKTOTAL, CKMB, CKMBINDEX, TROPONINI in the last 168 hours. BNP (last 3 results) No results for input(s): PROBNP in the last 8760 hours. HbA1C: Recent Labs    11/26/20 1849  HGBA1C 6.6*   CBG: Recent Labs  Lab 11/28/20 1230 11/28/20 2113 11/29/20 0802 11/29/20 1143 11/29/20 1611  GLUCAP 153* 136* 158* 190* 192*   Lipid Profile: No results for input(s): CHOL, HDL, LDLCALC, TRIG, CHOLHDL, LDLDIRECT in the last 72 hours. Thyroid Function Tests: No results for input(s): TSH, T4TOTAL, FREET4, T3FREE, THYROIDAB in the last 72 hours. Anemia Panel: No results for input(s): VITAMINB12, FOLATE, FERRITIN, TIBC, IRON, RETICCTPCT in the last 72 hours. Sepsis Labs: No results for input(s): PROCALCITON, LATICACIDVEN in the last 168 hours.  Recent Results (from the past 240 hour(s))  Resp Panel by RT-PCR (Flu A&B, Covid) Nasopharyngeal Swab     Status: None   Collection Time: 11/27/20  2:33 AM   Specimen: Nasopharyngeal Swab; Nasopharyngeal(NP) swabs in vial transport medium  Result Value Ref Range Status   SARS Coronavirus 2 by RT PCR NEGATIVE NEGATIVE Final    Comment: (NOTE) SARS-CoV-2 target nucleic acids are NOT DETECTED.  The SARS-CoV-2 RNA is generally detectable in upper respiratory specimens during the acute phase of infection. The lowest concentration of SARS-CoV-2 viral copies this assay can detect is 138 copies/mL. A negative result does not preclude SARS-Cov-2 infection and should not be used as the sole basis for treatment or other patient management decisions. A negative result may occur with  improper specimen collection/handling, submission of specimen other than nasopharyngeal swab, presence of viral mutation(s) within the areas targeted by this assay, and inadequate number of viral copies(<138 copies/mL). A negative result must be combined with clinical observations,  patient history, and epidemiological information. The expected result is Negative.  Fact Sheet for Patients:  EntrepreneurPulse.com.au  Fact Sheet for Healthcare Providers:  IncredibleEmployment.be  This test is no t yet approved or cleared by the [place] FDA and  has been authorized for detection and/or diagnosis of SARS-CoV-2 by FDA under an Emergency Use Authorization (EUA). This EUA will remain  in effect (meaning this test can be used) for the duration of the COVID-19 declaration under Section 564(b)(1) of the Act, 21 U.S.C.section 360bbb-3(b)(1), unless the authorization is terminated  or revoked sooner.       Influenza A by PCR NEGATIVE NEGATIVE Final   Influenza B by PCR NEGATIVE NEGATIVE Final    Comment: (NOTE) The Xpert Xpress SARS-CoV-2/FLU/RSV plus assay is intended as an aid in the diagnosis of influenza from Nasopharyngeal swab specimens and should not be used as a sole basis for treatment. Nasal washings and aspirates are unacceptable for Xpert Xpress SARS-CoV-2/FLU/RSV testing.  Fact Sheet for Patients: EntrepreneurPulse.com.au  Fact Sheet for Healthcare Providers: IncredibleEmployment.be  This test is not yet approved or cleared by the [place] FDA and has been authorized for detection and/or diagnosis of SARS-CoV-2 by FDA under an Emergency Use Authorization (EUA). This EUA will remain in effect (meaning this test can be used) for the duration of the COVID-19 declaration under Section 564(b)(1) of the Act, 21 U.S.C. section 360bbb-3(b)(1), unless the authorization is terminated or revoked.  Performed at [facility], [address], [place], [place] [address]       Imaging Studies   No results found.   Medications   Scheduled Meds: . cholecalciferol  1,000 Units Oral Daily  . enalapril  10 mg Oral Daily  . enoxaparin (LOVENOX) injection  40 mg  Subcutaneous Q24H  . metFORMIN  1,000 mg Oral BID WC  . rosuvastatin  20 mg Oral Daily   Continuous Infusions:     LOS: 2 days    Time spent: 30 minutes    Ezekiel Slocumb, DO [facility]  11/29/2020, 5:32 PM      If 7PM-7AM, please contact night-coverage. How to contact the [facility] Psychiatric Attending or Consulting provider [place] or covering provider during after hours [place], for this patient?    1. Check the care team in [facility] and look for a) attending/consulting [facility] provider listed and b) the [facility] team listed 2. Log into [email or web address] and use [place]'s universal password to access. If you do not have the password, please contact the hospital operator. 3. Locate  the [facility] provider you are looking for under [facility] and page to a number that you can be directly reached. 4. If you still have difficulty reaching the provider, please page the [facility] (Director on Call) for the Hospitalists listed on amion for assistance.

## 2020-11-29 NOTE — Progress Notes (Signed)
Identifying Statement: Douglas Lyons is a 75 y.o. male from [place] [id number] with numbness and balance difficulty  Physician Requesting Consultation: [place] [facility] emergency department  Daily events: 11/29/2020: No new events.   History of Present Illness: Douglas Lyons presents with over 3 weeks of worsening numbness in his hands bilaterally, left greater than right.  He additionally states he is had some difficulty with balance.  He is right-handed but has noted some decrease in strength over the left hand and arm.  The numbness does appear to travel up the arm towards the shoulder.  On the right side, he remains in the hand.  Overall, he does feel like this is worsening.  He does not report any inciting event and denies any falls or traumatic incidents prior to the onset.  He does not report any significant neck pain.  He did get a MRI and CT scan of the cervical spine in the emergency department.  Past Medical History:      Past Medical History:  Diagnosis Date  . Cataracts, bilateral   . Degenerative joint disease of knee, left   . Diabetes mellitus, type 2 ([place])   . Diverticulosis   . Erectile dysfunction   . GERD (gastroesophageal reflux disease)   . Heme positive stool   . Hyperlipidemia   . Hypertension   . Lumbar disc herniation   . Overweight (BMI 25.0-29.9)     Social History: Social History        Socioeconomic History  . Marital status: Married    Spouse name: Not on file  . Number of children: Not on file  . Years of education: Not on file  . Highest education level: Not on file  Occupational History  . Not on file  Tobacco Use  . Smoking status: Never Smoker  . Smokeless tobacco: Never Used  Vaping Use  . Vaping Use: Never used  Substance and Sexual Activity  . Alcohol use: Yes    Comment: OCCASIONAL   . Drug use: Not Currently  . Sexual activity: Not on file  Other Topics Concern  . Not on file  Social History  Narrative  . Not on file   Social Determinants of Health   Financial Resource Strain: Not on file  Food Insecurity: Not on file  Transportation Needs: Not on file  Physical Activity: Not on file  Stress: Not on file  Social Connections: Not on file  Intimate Partner Violence: Not on file    Family History: History reviewed. No pertinent family history.  Review of Systems:  Review of Systems - General ROS: Negative Psychological ROS: Negative Ophthalmic ROS: Negative ENT ROS: Negative Hematological and Lymphatic ROS: Negative  Endocrine ROS: Negative Respiratory ROS: Negative Cardiovascular ROS: Negative Gastrointestinal ROS: Negative Genito-Urinary ROS: Negative Musculoskeletal ROS: Negative for neck pain Neurological ROS: Positive for weakness, numbness Dermatological ROS: Negative  Physical Exam: Vitals:   11/29/20 0427 11/29/20 0801  BP: 139/78 (!) 151/74  Pulse: 63 65  Resp: 17 16  Temp: 98 F (36.7 C) 98 F (36.7 C)  SpO2: 96% 100%      General appearance: Alert, cooperative, in no acute distress Head: Normocephalic, atraumatic Eyes: Normal, EOM intact Oropharynx: Moist without lesions Neck: Supple, range of motion appears full with no increase symptoms on extension Ext: No edema in extremities  Neurologic exam:  Mental status: alertness: alert,  affect: normal Speech: fluent and clear Motor:strength symmetric 5/5 in bilateral upper and lower extremities with exception of  4-5 in left interossei and grip Sensory: Decreased light touch in bilateral hands as well as the forearm and distal arm on the left, peers intact in lower extremities Gait: Not tested  Laboratory:       Results for orders placed or performed during the hospital encounter of 11/26/20  Resp Panel by RT-PCR (Flu A&B, Covid) Nasopharyngeal Swab   Specimen: Nasopharyngeal Swab; Nasopharyngeal(NP) swabs in vial transport medium  Result Value Ref Range   SARS Coronavirus 2  by RT PCR NEGATIVE NEGATIVE   Influenza A by PCR NEGATIVE NEGATIVE   Influenza B by PCR NEGATIVE NEGATIVE  Protime-INR  Result Value Ref Range   Prothrombin Time 13.1 11.4 - 15.2 seconds   INR 1.0 0.8 - 1.2  APTT  Result Value Ref Range   aPTT 29 24 - 36 seconds  CBC  Result Value Ref Range   WBC 6.5 4.0 - 10.5 K/uL   RBC 4.86 4.22 - 5.81 MIL/uL   Hemoglobin 14.4 13.0 - 17.0 g/dL   HCT 43.3 39.0 - 52.0 %   MCV 89.1 80.0 - 100.0 fL   MCH 29.6 26.0 - 34.0 pg   MCHC 33.3 30.0 - 36.0 g/dL   RDW 12.9 11.5 - 15.5 %   Platelets 214 150 - 400 K/uL   nRBC 0.0 0.0 - 0.2 %  Differential  Result Value Ref Range   Neutrophils Relative % 73 %   Neutro Abs 4.7 1.7 - 7.7 K/uL   Lymphocytes Relative 16 %   Lymphs Abs 1.0 0.7 - 4.0 K/uL   Monocytes Relative 9 %   Monocytes Absolute 0.6 0.1 - 1.0 K/uL   Eosinophils Relative 1 %   Eosinophils Absolute 0.1 0.0 - 0.5 K/uL   Basophils Relative 1 %   Basophils Absolute 0.0 0.0 - 0.1 K/uL   Immature Granulocytes 0 %   Abs Immature Granulocytes 0.02 0.00 - 0.07 K/uL  Comprehensive metabolic panel  Result Value Ref Range   Sodium 137 135 - 145 mmol/L   Potassium 4.0 3.5 - 5.1 mmol/L   Chloride 103 98 - 111 mmol/L   CO2 25 22 - 32 mmol/L   Glucose, Bld 150 (H) 70 - 99 mg/dL   BUN 16 8 - 23 mg/dL   Creatinine, Ser 0.69 0.61 - 1.24 mg/dL   Calcium 9.2 8.9 - 10.3 mg/dL   Total Protein 7.1 6.5 - 8.1 g/dL   Albumin 4.2 3.5 - 5.0 g/dL   AST 12 (L) 15 - 41 U/L   ALT 12 0 - 44 U/L   Alkaline Phosphatase 58 38 - 126 U/L   Total Bilirubin 0.8 0.3 - 1.2 mg/dL   GFR, Estimated >60 >60 mL/min   Anion gap 9 5 - 15  CBG monitoring, ED  Result Value Ref Range   Glucose-Capillary 154 (H) 70 - 99 mg/dL  Type and screen Gays  Result Value Ref Range   ABO/RH(D) O NEG    Antibody Screen NEG    Sample Expiration      11/30/2020,2359 Performed at [facility], [address] [place], [place] [address]    I personally reviewed labs  Imaging: MRI cervical spine:Degenerative disc osteophyte at C3-4 with resultant severe spinal stenosis and flattening of the cervical spinal cord. 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 Additional degenerative spondylosis at C4-5 and C5-6 with resultant moderate spinal stenosis.  Multifactorial degenerative changes with resultant multilevel foraminal narrowing as above. Notable findings include  moderate left C3 foraminal stenosis, with severe bilateral C4, C5, C6, and left C7 foraminal narrowing.  CT Cervical Spine: No acute fracture, malalignment or bony lesion. 2. Multilevel cervical spondylosis and right worse than left facet arthropathy. Please see MRI of the cervical spine obtained earlier today for further detail of underlying neural impingement.   Impression/Plan:  Douglas Lyons is here for evaluation what appears to be early myelopathy with MRI showing severe stenosis at C3-4 and more moderate at C4-5.     1.  Diagnosis: Cervical myelopathy  2.  Plan - Continue PT - continue [place] J collar - Likely ACDF with Dr. Lacinda Axon on 12/01/20  Meade Maw MD Neurosurgery

## 2020-11-29 NOTE — Hospital Course (Signed)
Douglas Lyons is a 75 y.o. male with medical history significant for diabetes mellitus, GERD, hypertension, dyslipidemia who presents to the ER for evaluation of numbness and tingling which initially started in his left fingertips about a month ago but has now progressed to weakness of his left upper extremity as well as some numbness and tingling in his right hand.  He also notes that he has been unable to ambulate for about 4 days with recently noted left leg weakness at PCP visit.   CT cervical spine no acute fracture, malalignment or bony lesion. Multilevel cervical spondylosis and right worse than left facet arthropathy.   MRI cervical spine shows degenerative disc osteophyte at C3-4 with resultant severe spinal stenosis and flattening of the cervical spinal cord. 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 (See full report for other findings.)  MRI brain without contrast negative for acute findings. A 2.7 cm sclerotic lesion involving the left calvarium, also seen on prior head CT.  Again, further evaluation with non emergent outpatient bone scan recommended for further evaluation.  Patient admitted to hospitalist service with neurosurgery consulted.    Plan is for surgery on Friday 12/01/20.

## 2020-11-30 DIAGNOSIS — I10 Essential (primary) hypertension: Secondary | ICD-10-CM | POA: Diagnosis not present

## 2020-11-30 DIAGNOSIS — R2681 Unsteadiness on feet: Secondary | ICD-10-CM

## 2020-11-30 DIAGNOSIS — M4802 Spinal stenosis, cervical region: Secondary | ICD-10-CM | POA: Diagnosis not present

## 2020-11-30 DIAGNOSIS — K219 Gastro-esophageal reflux disease without esophagitis: Secondary | ICD-10-CM | POA: Diagnosis not present

## 2020-11-30 LAB — BASIC METABOLIC PANEL
Anion gap: 8 (ref 5–15)
BUN: 23 mg/dL (ref 8–23)
CO2: 23 mmol/L (ref 22–32)
Calcium: 8.8 mg/dL — ABNORMAL LOW (ref 8.9–10.3)
Chloride: 105 mmol/L (ref 98–111)
Creatinine, Ser: 0.81 mg/dL (ref 0.61–1.24)
GFR, Estimated: >60 mL/min (ref >60)
Glucose, Bld: 151 mg/dL — ABNORMAL HIGH (ref 70–99)
Potassium: 4.1 mmol/L (ref 3.5–5.1)
Sodium: 136 mmol/L (ref 135–145)

## 2020-11-30 LAB — GLUCOSE, CAPILLARY
Glucose-Capillary: 144 mg/dL — ABNORMAL HIGH (ref 70–99)
Glucose-Capillary: 195 mg/dL — ABNORMAL HIGH (ref 70–99)

## 2020-11-30 MED ORDER — INSULIN ASPART 100 UNIT/ML ~~LOC~~ SOLN
0.0000 [IU] | Freq: Three times a day (TID) | SUBCUTANEOUS | Status: DC
  Administered 2020-11-30 – 2020-12-01 (×2): 2 [IU] via SUBCUTANEOUS
  Administered 2020-12-01 – 2020-12-02 (×2): 3 [IU] via SUBCUTANEOUS
  Administered 2020-12-02 – 2020-12-03 (×2): 5 [IU] via SUBCUTANEOUS
  Administered 2020-12-03: 3 [IU] via SUBCUTANEOUS
  Administered 2020-12-04 (×2): 5 [IU] via SUBCUTANEOUS
  Filled 2020-11-30 (×10): qty 1

## 2020-11-30 MED ORDER — INSULIN ASPART 100 UNIT/ML ~~LOC~~ SOLN
0.0000 [IU] | Freq: Every day | SUBCUTANEOUS | Status: DC
  Administered 2020-12-01: 2 [IU] via SUBCUTANEOUS
  Filled 2020-11-30 (×2): qty 1

## 2020-11-30 NOTE — Progress Notes (Signed)
PT Cancellation Note  Patient Details Name: Douglas Lyons MRN: [id number] DOB: 1946-06-22   Cancelled Treatment:    Reason Eval/Treat Not Completed: Patient declined, no reason specified.  Pt refusing therapy this afternoon due to eating lunch.  Will attempt to see at later date/time as medically appropriate.  Gwenlyn Saran, PT, DPT 11/30/20, 12:05PM

## 2020-11-30 NOTE — Progress Notes (Signed)
Identifying Statement: Douglas Lyons is a 75 y.o. male from [place] [id number] with numbness and balance difficulty  Physician Requesting Consultation: [facility]  Daily events: 11/30/2020: Working with physical therapy. Medically optimized.  11/29/2020: No new events.   History of Present Illness: [name] presents with over 3 weeks of worsening numbness in his hands bilaterally, left greater than right.  He additionally states he is had some difficulty with balance.  He is right-handed but has noted some decrease in strength over the left hand and arm.  The numbness does appear to travel up the arm towards the shoulder.  On the right side, he remains in the hand.  Overall, he does feel like this is worsening.  He does not report any inciting event and denies any falls or traumatic incidents prior to the onset.  He does not report any significant neck pain.  He did get a MRI and CT scan of the cervical spine in the emergency department.  Past Medical History:      Past Medical History:  Diagnosis Date  . Cataracts, bilateral   . Degenerative joint disease of knee, left   . Diabetes mellitus, type 2 ([place])   . Diverticulosis   . Erectile dysfunction   . GERD (gastroesophageal reflux disease)   . Heme positive stool   . Hyperlipidemia   . Hypertension   . Lumbar disc herniation   . Overweight (BMI 25.0-29.9)     Social History: Social History        Socioeconomic History  . Marital status: Married    Spouse name: Not on file  . Number of children: Not on file  . Years of education: Not on file  . Highest education level: Not on file  Occupational History  . Not on file  Tobacco Use  . Smoking status: Never Smoker  . Smokeless tobacco: Never Used  Vaping Use  . Vaping Use: Never used  Substance and Sexual Activity  . Alcohol use: Yes    Comment: OCCASIONAL   . Drug use: Not Currently  . Sexual activity: Not on file   Other Topics Concern  . Not on file  Social History Narrative  . Not on file   Social Determinants of Health   Financial Resource Strain: Not on file  Food Insecurity: Not on file  Transportation Needs: Not on file  Physical Activity: Not on file  Stress: Not on file  Social Connections: Not on file  Intimate Partner Violence: Not on file    Family History: History reviewed. No pertinent family history.  Review of Systems:  Review of Systems - General ROS: Negative Psychological ROS: Negative Ophthalmic ROS: Negative ENT ROS: Negative Hematological and Lymphatic ROS: Negative  Endocrine ROS: Negative Respiratory ROS: Negative Cardiovascular ROS: Negative Gastrointestinal ROS: Negative Genito-Urinary ROS: Negative Musculoskeletal ROS: Negative for neck pain Neurological ROS: Positive for weakness, numbness Dermatological ROS: Negative  Physical Exam: Vitals:   11/30/20 0004 11/30/20 0434  BP: 124/73 122/73  Pulse: 60 67  Resp: 17 16  Temp: 98 F (36.7 C) 97.8 F (36.6 C)  SpO2: 98% 97%      General appearance: Alert, cooperative, in no acute distress Head: Normocephalic, atraumatic Eyes: Normal, EOM intact Oropharynx: Moist without lesions Neck: Supple, range of motion appears full with no increase symptoms on extension Ext: No edema in extremities  Neurologic exam:  Mental status: alertness: alert,  affect: normal Speech: fluent and clear Motor:strength symmetric 5/5 in bilateral  upper and lower extremities with exception of 4-5 in left interossei and grip Sensory: Decreased light touch in bilateral hands as well as the forearm and distal arm on the left, peers intact in lower extremities Gait: Not tested  Laboratory:       Results for orders placed or performed during the hospital encounter of 11/26/20  Resp Panel by RT-PCR (Flu A&B, Covid) Nasopharyngeal Swab   Specimen: Nasopharyngeal Swab; Nasopharyngeal(NP) swabs in vial transport  medium  Result Value Ref Range   SARS Coronavirus 2 by RT PCR NEGATIVE NEGATIVE   Influenza A by PCR NEGATIVE NEGATIVE   Influenza B by PCR NEGATIVE NEGATIVE  Protime-INR  Result Value Ref Range   Prothrombin Time 13.1 11.4 - 15.2 seconds   INR 1.0 0.8 - 1.2  APTT  Result Value Ref Range   aPTT 29 24 - 36 seconds  CBC  Result Value Ref Range   WBC 6.5 4.0 - 10.5 K/uL   RBC 4.86 4.22 - 5.81 MIL/uL   Hemoglobin 14.4 13.0 - 17.0 g/dL   HCT 43.3 39.0 - 52.0 %   MCV 89.1 80.0 - 100.0 fL   MCH 29.6 26.0 - 34.0 pg   MCHC 33.3 30.0 - 36.0 g/dL   RDW 12.9 11.5 - 15.5 %   Platelets 214 150 - 400 K/uL   nRBC 0.0 0.0 - 0.2 %  Differential  Result Value Ref Range   Neutrophils Relative % 73 %   Neutro Abs 4.7 1.7 - 7.7 K/uL   Lymphocytes Relative 16 %   Lymphs Abs 1.0 0.7 - 4.0 K/uL   Monocytes Relative 9 %   Monocytes Absolute 0.6 0.1 - 1.0 K/uL   Eosinophils Relative 1 %   Eosinophils Absolute 0.1 0.0 - 0.5 K/uL   Basophils Relative 1 %   Basophils Absolute 0.0 0.0 - 0.1 K/uL   Immature Granulocytes 0 %   Abs Immature Granulocytes 0.02 0.00 - 0.07 K/uL  Comprehensive metabolic panel  Result Value Ref Range   Sodium 137 135 - 145 mmol/L   Potassium 4.0 3.5 - 5.1 mmol/L   Chloride 103 98 - 111 mmol/L   CO2 25 22 - 32 mmol/L   Glucose, Bld 150 (H) 70 - 99 mg/dL   BUN 16 8 - 23 mg/dL   Creatinine, Ser 0.69 0.61 - 1.24 mg/dL   Calcium 9.2 8.9 - 10.3 mg/dL   Total Protein 7.1 6.5 - 8.1 g/dL   Albumin 4.2 3.5 - 5.0 g/dL   AST 12 (L) 15 - 41 U/L   ALT 12 0 - 44 U/L   Alkaline Phosphatase 58 38 - 126 U/L   Total Bilirubin 0.8 0.3 - 1.2 mg/dL   GFR, Estimated >60 >60 mL/min   Anion gap 9 5 - 15  CBG monitoring, ED  Result Value Ref Range   Glucose-Capillary 154 (H) 70 - 99 mg/dL  Type and screen [place]  Result Value Ref Range   ABO/RH(D) O NEG    Antibody Screen NEG    Sample Expiration       11/30/2020,2359 Performed at [facility], [address] [place], [place] [address]    I personally reviewed labs  Imaging: MRI cervical spine:Degenerative disc osteophyte at C3-4 with resultant severe spinal stenosis and flattening of the cervical spinal cord. 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 Additional degenerative spondylosis at C4-5 and C5-6 with resultant moderate spinal stenosis.  Multifactorial degenerative changes with resultant multilevel  foraminal narrowing as above. Notable findings include moderate left C3 foraminal stenosis, with severe bilateral C4, C5, C6, and left C7 foraminal narrowing.  CT Cervical Spine: No acute fracture, malalignment or bony lesion. 2. Multilevel cervical spondylosis and right worse than left facet arthropathy. Please see MRI of the cervical spine obtained earlier today for further detail of underlying neural impingement.   Impression/Plan:  Mr. 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     1.  Diagnosis: Cervical myelopathy  2.  Plan - Continue PT - continue [place] J collar - Likely ACDF with Dr. Lacinda Axon on 12/01/20 - NPO after midnight. Hold anticoagulants.  Meade Maw MD Neurosurgery

## 2020-11-30 NOTE — Progress Notes (Signed)
Inpatient Diabetes Program Recommendations  AACE/ADA: New Consensus Statement on Inpatient Glycemic Control (2015)  Target Ranges:  Prepandial:   less than 140 mg/dL      Peak postprandial:   less than 180 mg/dL (1-2 hours)      Critically ill patients:  140 - 180 mg/dL   Lab Results  Component Value Date   GLUCAP 268 (H) 11/29/2020   HGBA1C 6.6 (H) 11/26/2020    Review of Glycemic Control  Diabetes history: type 2 Outpatient Diabetes medications: Metformin 1000 mg BID Current orders for Inpatient glycemic control: Metformin 1000 mg BID  Inpatient Diabetes Program Recommendations:   Noted that patient is on Metformin in the hospital. Noted that patient is to have surgery tomorrow.   Recommend starting Novolog MODERATE correction scale TID & 0-5 units HS scale while in the hospital. Will need CBGs checked TID & HS. If NPO, recommend MODERATE correction scale every 4 hours.   Harvel Ricks RN BSN CDE Diabetes Coordinator Pager: [phone]  8am-5pm

## 2020-11-30 NOTE — Progress Notes (Signed)
Physical Therapy Treatment Patient Details Name: Douglas Lyons MRN: [id number] DOB: 07-01-1946 Today's Date: 11/30/2020    History of Present Illness Douglas Lyons is a 33yoM who [facility] with over 3 weeks of worsening numbness in his hands bilaterally, left greater than right.  He additionally states he is had some difficulty with balance.  He is right-handed but has noted some decrease in strength over the left hand and arm.  The numbness does appear to travel up the arm towards the shoulder.  On the right side, he remains in the hand.  Overall, he does feel like this is worsening.  He does not report any inciting event and denies any falls or traumatic incidents prior to the onset. 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    PT Comments    Pt received upright in recliner upon arrival to room and pt is agreeable to therapy.  Pt notes that he is feeling much better today and would like to attempt the stairs again.  Pt able to transfer out of the recliner with good technique.  Pt ambulated to the gym and performed stair training with B handrails to mimic stairs at home.  Pt prefers to descend stair backwards as it makes him feel safer.  Pt performed well with no issues.  Pt transferred back to recliner in room where call bell and telephone were placed within reaching distance and chair alarm set.  Current discharge plans to home with supervision for mobility remain appropriate at this time.  Pt will continue to benefit from skilled therapy in order to address deficits listed below.    Follow Up Recommendations  Supervision for mobility/OOB;Follow surgeon's recommendation for DC plan and follow-up therapies     Equipment Recommendations  None recommended by PT    Recommendations for Other Services       Precautions /  Restrictions Precautions Precautions: Fall;Cervical Precaution Booklet Issued: No Required Braces or Orthoses: Cervical Brace Cervical Brace: Hard collar Restrictions Weight Bearing Restrictions: No    Mobility  Bed Mobility               General bed mobility comments: pt in recliner upon arrival to room.    Transfers Overall transfer level: Needs assistance Equipment used: Rolling walker (2 wheeled) Transfers: Sit to/from Stand Sit to Stand: Supervision;From elevated surface         General transfer comment: no LOB or SOB  Ambulation/Gait Ambulation/Gait assistance: Min guard Gait Distance (Feet): 100 Feet Assistive device: Rolling walker (2 wheeled)   Gait velocity: decreased   General Gait Details: Pt able to ambulate to rehab gym and back to room following stair training.  No LOB during session.   Stairs Stairs: Yes Stairs assistance: Min guard Stair Management: Two rails;Step to pattern Number of Stairs: 4     Wheelchair Mobility    Modified Rankin (Stroke Patients Only)       Balance Overall balance assessment: Needs assistance Sitting-balance support: No upper extremity supported;Feet supported Sitting balance-Leahy Scale: Good     Standing balance support: Bilateral upper extremity supported;During functional activity Standing balance-Leahy Scale: Poor Standing balance comment: Pt reports 2 falls in previous week, no falls prior to that; fall Tuesday with nursing.  Cognition Arousal/Alertness: Awake/alert Behavior During Therapy: WFL for tasks assessed/performed Overall Cognitive Status: Within Functional Limits for tasks assessed                                        Exercises      General Comments        Pertinent Vitals/Pain Pain Assessment: No/denies pain    Home Living Family/patient expects to be discharged to:: Private residence Living Arrangements:  Spouse/significant other Available Help at Discharge: Family;Available 24 hours/day Type of Home: House Home Access: Stairs to enter Entrance Stairs-Rails: None Home Layout: One level Home Equipment: Environmental consultant - 2 wheels;Other (comment) (rollator)      Prior Function Level of Independence: Independent      Comments: 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   PT Goals (current goals can now be found in the care plan section) Acute Rehab PT Goals Patient Stated Goal: to be active again and get back to work PT Goal Formulation: With patient Time For Goal Achievement: 12/11/20 Potential to Achieve Goals: Good Progress towards PT goals: Progressing toward goals    Frequency    7X/week      PT Plan Discharge plan needs to be updated;Current plan remains appropriate    Co-evaluation              AM-PAC PT "6 Clicks" Mobility   Outcome Measure  Help needed turning from your back to your side while in a flat bed without using bedrails?: None Help needed moving from lying on your back to sitting on the side of a flat bed without using bedrails?: None Help needed moving to and from a bed to a chair (including a wheelchair)?: A Little Help needed standing up from a chair using your arms (e.g., wheelchair or bedside chair)?: A Little Help needed to walk in hospital room?: A Little Help needed climbing 3-5 steps with a railing? : A Lot 6 Click Score: 19    End of Session Equipment Utilized During Treatment: Gait belt;Cervical collar Activity Tolerance: Patient tolerated treatment well;Patient limited by fatigue Patient left: in chair;with call bell/phone within reach;Other (comment) Nurse Communication: Mobility status PT Visit Diagnosis: Unsteadiness on feet (R26.81);Difficulty in walking, not elsewhere classified (R26.2);Other abnormalities of gait and mobility (R26.89);Muscle weakness (generalized) (M62.81);Ataxic gait (R26.0);Other symptoms and signs involving the  nervous system (L97.673)     Time: 4193-7902 PT Time Calculation (min) (ACUTE ONLY): 34 min  Charges:  $Gait Training: 23-37 mins                      Gwenlyn Saran, PT, DPT 11/30/20, 4:29 PM

## 2020-11-30 NOTE — Anesthesia Preprocedure Evaluation (Deleted)
Anesthesia Evaluation    Airway        Dental   Pulmonary           Cardiovascular hypertension,      Neuro/Psych    GI/Hepatic   Endo/Other  diabetes  Renal/GU      Musculoskeletal   Abdominal   Peds  Hematology   Anesthesia Other Findings Past Medical History: No date: Cataracts, bilateral No date: Degenerative joint disease of knee, left No date: Diabetes mellitus, type 2 ([facility]) No date: Diverticulosis No date: Erectile dysfunction No date: GERD (gastroesophageal reflux disease) No date: Heme positive stool No date: Hyperlipidemia No date: Hypertension 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 No date: Overweight (BMI 25.0-29.9)   Reproductive/Obstetrics                             Anesthesia Physical Anesthesia Plan Anesthesia Quick Evaluation

## 2020-11-30 NOTE — Progress Notes (Signed)
PROGRESS NOTE    Douglas Lyons   WRU:[id number]  DOB: [birth year]  PCP: Sallee Lange, NP    DOA: 11/26/2020 LOS: 3   Brief Narrative   Douglas Lyons is a 75 y.o. male with medical history significant for diabetes mellitus, GERD, hypertension, dyslipidemia who presents to the ER for evaluation of numbness and tingling which initially started in his left fingertips about a month ago but has now progressed to weakness of his left upper extremity as well as some numbness and tingling in his right hand.  He also notes that he has been unable to ambulate for about 4 days with recently noted left leg weakness at PCP visit.   CT cervical spine no acute fracture, malalignment or bony lesion. Multilevel cervical spondylosis and right worse than left facet arthropathy.   MRI cervical spine shows degenerative disc osteophyte at C3-4 with resultant severe spinal stenosis and flattening of the cervical spinal cord. 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 (See full report for other findings.)  MRI brain without contrast negative for acute findings. A 2.7 cm sclerotic lesion involving the left calvarium, also seen on prior head CT.  Again, further evaluation with non emergent outpatient bone scan recommended for further evaluation.  Patient admitted to hospitalist service with neurosurgery consulted.    Plan is for surgery on Friday 12/01/20.   Assessment & Plan   Principal Problem:   Stenosis of cervical spine with myelopathy (HCC) Active Problems:   Gait instability   Diabetes mellitus, type 2 (HCC)   Hypertension   GERD (gastroesophageal reflux disease)   Cervical stenosis with secondary cervical myelopathy Several weeks progressive meylopathic symptoms (cervical radiculopathy, gait dysfunction). Severe spinal stenosis seen at C3-4 with signal abnormality consistent w/ compressive myelopathy.  --Neurosurgery following --Plan for ACDF on 4/8 with  Dr. Lacinda Axon --Continue [place] J collar --PT consulted, will need post-op assessment --Monitor for signs progression --Hold home aspirin  Hypertension -Continue home enalapril.  Hydralazine PRN  Type 2 Diabetes - controlled with recent a1c 6.6%. --diabetes coordinator following, appreciate assistance  --hold home glipizide and metformin --sliding scale Novolog added --continue statin --hold aspirin in anticipation of upcoming surgery --monitor CBG's and add sliding scale coverage if indicated   Obesity: Body mass index is 30.28 kg/m.  Complicates overall care and prognosis.  Recommend lifestyle modifications including physical activity and diet for weight loss and overall long-term health.   DVT prophylaxis:    Diet:  Diet Orders (From admission, onward)    Start     Ordered   12/01/20 0001  Diet NPO time specified  Diet effective midnight        11/30/20 0731   11/27/20 0938  Diet Carb Modified Fluid consistency: Thin; Room service appropriate? Yes  Diet effective now       Question Answer Comment  Diet-HS Snack? Nothing   Calorie Level Medium 1600-2000   Fluid consistency: Thin   Room service appropriate? Yes      11/27/20 0937            Code Status: Full Code    Subjective 11/30/20    Pt up in recliner today, wife arrived during encounter.  Patient feels well.  Slept well.  Good appetite.  No acute complaints.     Disposition Plan & Communication   Status is: Inpatient  Inpatient status remains appropriate due to severity of illness, patient requires neurosurgery which is planned for 4/8.  Dispo: The patient is from:  Home              Anticipated d/c is to: Home              Patient currently not medically stable for discharge   Difficult to place patient now  Family Communication: wife at bedside on rounds today 4/7   Consults, Procedures, Significant Events   Consultants:   Neurosurgery  Procedures:   None  Antimicrobials:  Anti-infectives  (From admission, onward)   None        Micro    Objective   Vitals:   11/30/20 0434 11/30/20 0801 11/30/20 1203 11/30/20 1533  BP: 122/73 133/73 118/72 126/65  Pulse: 67 (!) 59 72 75  Resp: 16 18 17 18   Temp: 97.8 F (36.6 C) 98.5 F (36.9 C) 98 F (36.7 C) 98.4 F (36.9 C)  TempSrc: Oral     SpO2: 97% 96% 98% 100%  Weight:      Height:        Intake/Output Summary (Last 24 hours) at 11/30/2020 1854 Last data filed at 11/30/2020 1849 Gross per 24 hour  Intake 600 ml  Output 200 ml  Net 400 ml   Filed Weights   11/26/20 1846  Weight: 95.7 kg    Physical Exam:  General exam: awake, alert, no acute distress, up in recliner, in good spirits HEENT: Cervical collar in place, moist mucus membranes, hearing grossly normal  Respiratory system: CTAB, no wheezes, rales or rhonchi, normal respiratory effort. Cardiovascular system: normal S1/S2, RRR, no peripheral edema Gastrointestinal system: soft, NT, ND Central nervous system: A&O x4. no gross focal neurologic deficits, normal speech Psychiatry: normal mood, congruent affect, judgement and insight appear normal  Labs   Data Reviewed: I have personally reviewed following labs and imaging studies  CBC: Recent Labs  Lab 11/26/20 1849 11/28/20 0514  WBC 6.5 4.9  NEUTROABS 4.7  --   HGB 14.4 14.1  HCT 43.3 40.9  MCV 89.1 87.2  PLT 214 878   Basic Metabolic Panel: Recent Labs  Lab 11/26/20 1849 11/28/20 0514 11/29/20 0617 11/30/20 0411  NA 137 136 136 136  K 4.0 4.0 4.1 4.1  CL 103 105 102 105  CO2 25 25 26 23   GLUCOSE 150* 150* 143* 151*  BUN 16 15 19 23   CREATININE 0.69 0.74 0.78 0.81  CALCIUM 9.2 8.9 8.8* 8.8*   GFR: Estimated Creatinine Clearance: 90.1 mL/min (by C-G formula based on SCr of 0.81 mg/dL). Liver Function Tests: Recent Labs  Lab 11/26/20 1849  AST 12*  ALT 12  ALKPHOS 58  BILITOT 0.8  PROT 7.1  ALBUMIN 4.2   No results for input(s): LIPASE, AMYLASE in the last 168 hours. No  results for input(s): AMMONIA in the last 168 hours. Coagulation Profile: Recent Labs  Lab 11/26/20 1849  INR 1.0   Cardiac Enzymes: No results for input(s): CKTOTAL, CKMB, CKMBINDEX, TROPONINI in the last 168 hours. BNP (last 3 results) No results for input(s): PROBNP in the last 8760 hours. HbA1C: No results for input(s): HGBA1C in the last 72 hours. CBG: Recent Labs  Lab 11/29/20 0802 11/29/20 1143 11/29/20 1611 11/29/20 2032 11/30/20 1641  GLUCAP 158* 190* 192* 268* 144*   Lipid Profile: No results for input(s): CHOL, HDL, LDLCALC, TRIG, CHOLHDL, LDLDIRECT in the last 72 hours. Thyroid Function Tests: No results for input(s): TSH, T4TOTAL, FREET4, T3FREE, THYROIDAB in the last 72 hours. Anemia Panel: No results for input(s): VITAMINB12, FOLATE, FERRITIN, TIBC, IRON, RETICCTPCT in  the last 72 hours. Sepsis Labs: No results for input(s): PROCALCITON, LATICACIDVEN in the last 168 hours.  Recent Results (from the past 240 hour(s))  Resp Panel by RT-PCR (Flu A&B, Covid) Nasopharyngeal Swab     Status: None   Collection Time: 11/27/20  2:33 AM   Specimen: Nasopharyngeal Swab; Nasopharyngeal(NP) swabs in vial transport medium  Result Value Ref Range Status   SARS Coronavirus 2 by RT PCR NEGATIVE NEGATIVE Final    Comment: (NOTE) SARS-CoV-2 target nucleic acids are NOT DETECTED.  The SARS-CoV-2 RNA is generally detectable in upper respiratory specimens during the acute phase of infection. The lowest concentration of SARS-CoV-2 viral copies this assay can detect is 138 copies/mL. A negative result does not preclude SARS-Cov-2 infection and should not be used as the sole basis for treatment or other patient management decisions. A negative result may occur with  improper specimen collection/handling, submission of specimen other than nasopharyngeal swab, presence of viral mutation(s) within the areas targeted by this assay, and inadequate number of viral copies(<138  copies/mL). A negative result must be combined with clinical observations, patient history, and epidemiological information. The expected result is Negative.  Fact Sheet for Patients:  EntrepreneurPulse.com.au  Fact Sheet for Healthcare Providers:  IncredibleEmployment.be  This test is no t yet approved or cleared by the [place] FDA and  has been authorized for detection and/or diagnosis of SARS-CoV-2 by FDA under an Emergency Use Authorization (EUA). This EUA will remain  in effect (meaning this test can be used) for the duration of the COVID-19 declaration under Section 564(b)(1) of the Act, 21 U.S.C.section 360bbb-3(b)(1), unless the authorization is terminated  or revoked sooner.       Influenza A by PCR NEGATIVE NEGATIVE Final   Influenza B by PCR NEGATIVE NEGATIVE Final    Comment: (NOTE) The Xpert Xpress SARS-CoV-2/FLU/RSV plus assay is intended as an aid in the diagnosis of influenza from Nasopharyngeal swab specimens and should not be used as a sole basis for treatment. Nasal washings and aspirates are unacceptable for Xpert Xpress SARS-CoV-2/FLU/RSV testing.  Fact Sheet for Patients: EntrepreneurPulse.com.au  Fact Sheet for Healthcare Providers: IncredibleEmployment.be  This test is not yet approved or cleared by the [place] FDA and has been authorized for detection and/or diagnosis of SARS-CoV-2 by FDA under an Emergency Use Authorization (EUA). This EUA will remain in effect (meaning this test can be used) for the duration of the COVID-19 declaration under Section 564(b)(1) of the Act, 21 U.S.C. section 360bbb-3(b)(1), unless the authorization is terminated or revoked.  Performed at [facility], [address] [place], [place] [address]       Imaging Studies   No results found.   Medications   Scheduled Meds: . cholecalciferol  1,000 Units Oral Daily  .  enalapril  10 mg Oral Daily  . insulin aspart  0-15 Units Subcutaneous TID WC  . insulin aspart  0-5 Units Subcutaneous QHS  . metFORMIN  1,000 mg Oral BID WC  . rosuvastatin  20 mg Oral Daily   Continuous Infusions:     LOS: 3 days    Time spent: 25 minutes with > 50% spent at bedside and in coordination of care.    Ezekiel Slocumb, DO [facility]  11/30/2020, 6:54 PM      If 7PM-7AM, please contact night-coverage. How to contact the [facility] Attending or Consulting provider [place] or covering provider during after hours [place], for this patient?    1.  Check the care team in [facility] and look for a) attending/consulting [facility] provider listed and b) the [facility] team listed 2. Log into [email or web address] and use [place]'s universal password to access. If you do not have the password, please contact the hospital operator. 3. Locate the [facility] provider you are looking for under [facility] and page to a number that you can be directly reached. 4. If you still have difficulty reaching the provider, please page the [facility] (Director on Call) for the Hospitalists listed on amion for assistance.

## 2020-12-01 ENCOUNTER — Inpatient Hospital Stay: Payer: Medicare Other

## 2020-12-01 ENCOUNTER — Inpatient Hospital Stay: Payer: Medicare Other | Admitting: Anesthesiology

## 2020-12-01 ENCOUNTER — Encounter: Payer: Self-pay | Admitting: Internal Medicine

## 2020-12-01 ENCOUNTER — Encounter
Admission: EM | Disposition: A | Discharge status: Home Health | Payer: Self-pay | Source: Home / Self Care | Attending: Neurosurgery

## 2020-12-01 HISTORY — PX: ANTERIOR CERVICAL DECOMP/DISCECTOMY FUSION: SHX1161

## 2020-12-01 LAB — BASIC METABOLIC PANEL
Anion gap: 9 (ref 5–15)
BUN: 21 mg/dL (ref 8–23)
CO2: 23 mmol/L (ref 22–32)
Calcium: 8.9 mg/dL (ref 8.9–10.3)
Chloride: 104 mmol/L (ref 98–111)
Creatinine, Ser: 0.83 mg/dL (ref 0.61–1.24)
GFR, Estimated: >60 mL/min (ref >60)
Glucose, Bld: 138 mg/dL — ABNORMAL HIGH (ref 70–99)
Potassium: 4.3 mmol/L (ref 3.5–5.1)
Sodium: 136 mmol/L (ref 135–145)

## 2020-12-01 LAB — GLUCOSE, CAPILLARY
Glucose-Capillary: 146 mg/dL — ABNORMAL HIGH (ref 70–99)
Glucose-Capillary: 149 mg/dL — ABNORMAL HIGH (ref 70–99)
Glucose-Capillary: 184 mg/dL — ABNORMAL HIGH (ref 70–99)
Glucose-Capillary: 190 mg/dL — ABNORMAL HIGH (ref 70–99)
Glucose-Capillary: 239 mg/dL — ABNORMAL HIGH (ref 70–99)

## 2020-12-01 LAB — SURGICAL PCR SCREEN
MRSA, PCR: NEGATIVE
Staphylococcus aureus: POSITIVE — AB

## 2020-12-01 IMAGING — RF DG C-ARM 1-60 MIN
1 series · 2 of 2 positions shown · non-contrast
Comparison: CT [DATE].

CLINICAL DATA: C3-4, C4-C5 ACDF.

EXAM:
DG C-ARM 1-60 MIN; CERVICAL SPINE - 2-3 VIEW
FLUOROSCOPY TIME:  Fluoroscopy Time:  12 seconds.
Number of Acquired Spot Images: 2

[Series 1: dg x-ray · 0.20mm/px · 2 of 2 slices shown]
[im 1/2]
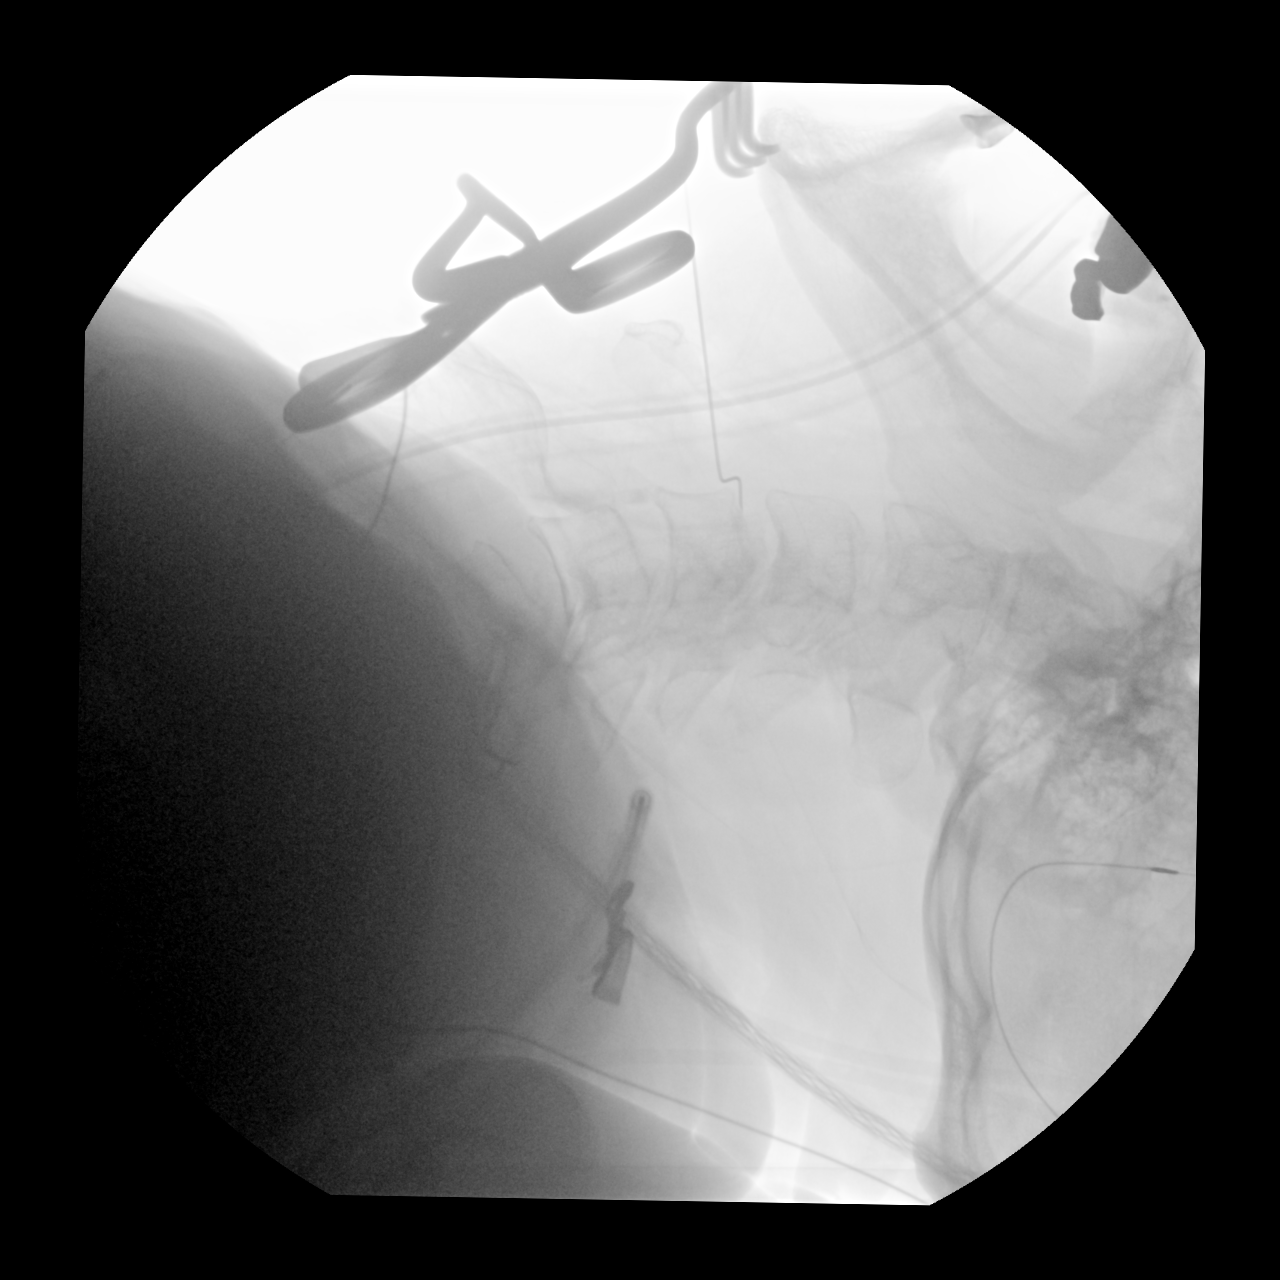
[im 2/2]
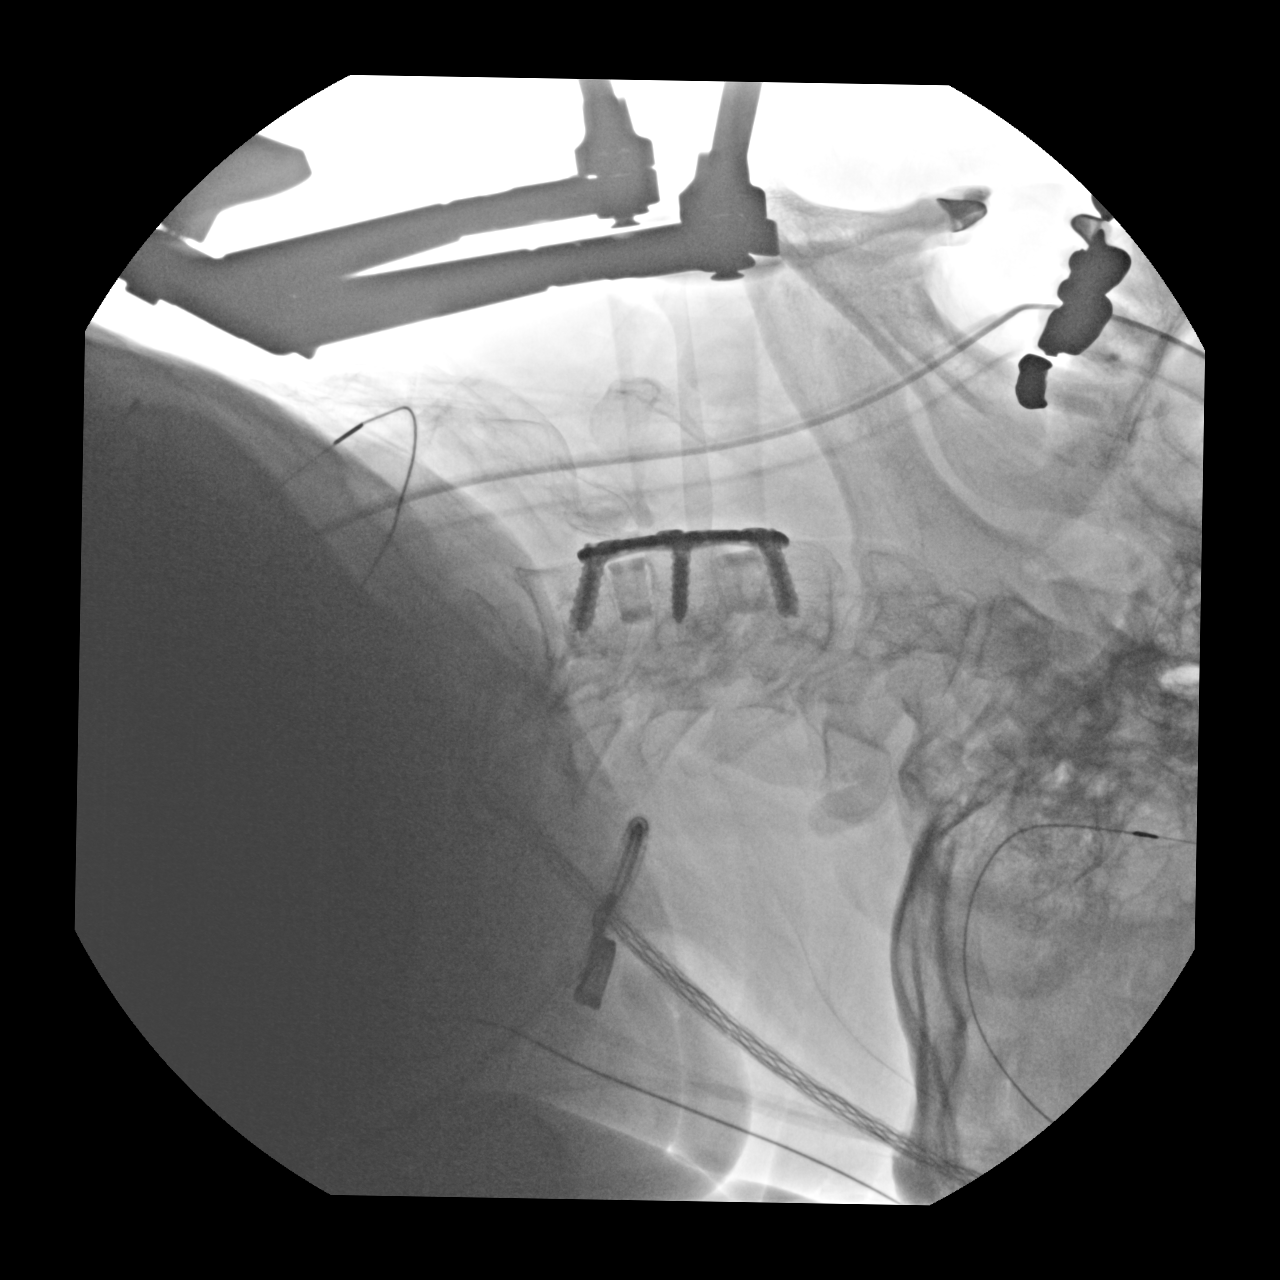

[2 of 2 positions shown; findings below may reference images not displayed]

FINDINGS: Two C-arm fluoroscopic images were obtained intraoperatively and
submitted for post operative interpretation. The first image
demonstrates a needle tip projecting at the C4-C5 interbody space
anteriorly. The second image demonstrates C3-C5 ACDF with plate and
screws and intervening spacers. Please see the performing provider's
procedural report for further detail.
IMPRESSION: Intraoperative fluoroscopy, as detailed above.

## 2020-12-01 IMAGING — RF DG CERVICAL SPINE 2 OR 3 VIEWS
1 series · 2 of 2 positions shown · non-contrast
Comparison: CT [DATE].

CLINICAL DATA: C3-4, C4-C5 ACDF.

EXAM:
DG C-ARM 1-60 MIN; CERVICAL SPINE - 2-3 VIEW
FLUOROSCOPY TIME:  Fluoroscopy Time:  12 seconds.
Number of Acquired Spot Images: 2

[Series 1: dg x-ray · 0.20mm/px · 2 of 2 slices shown]
[im 1/2]
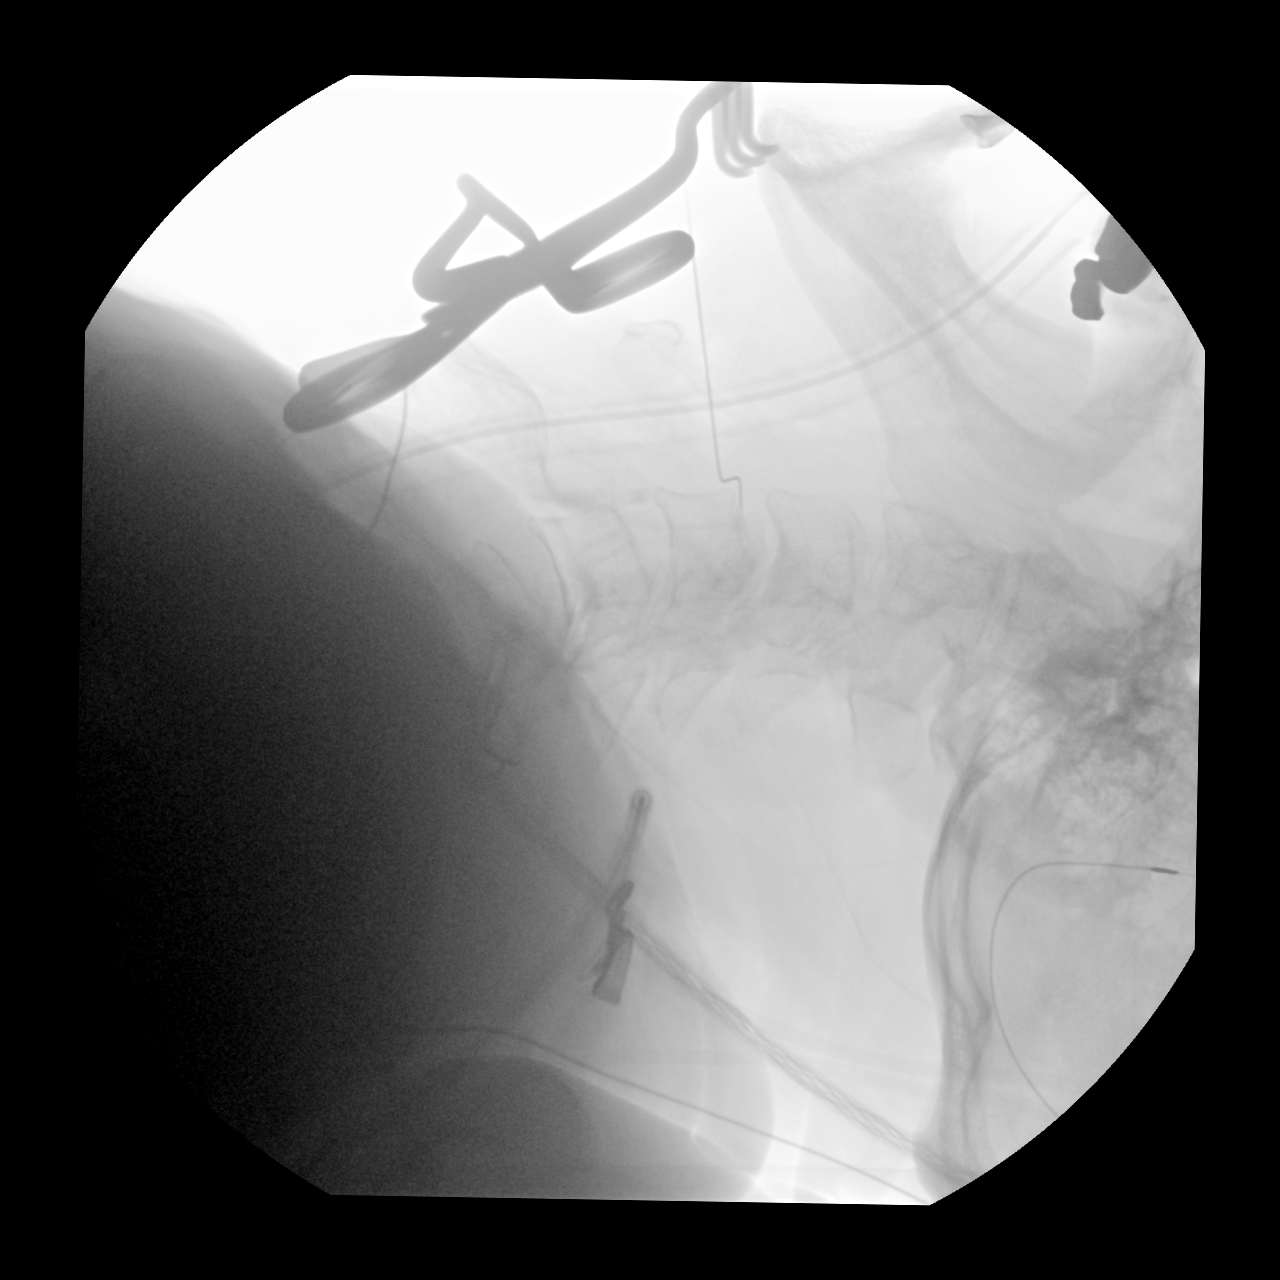
[im 2/2]
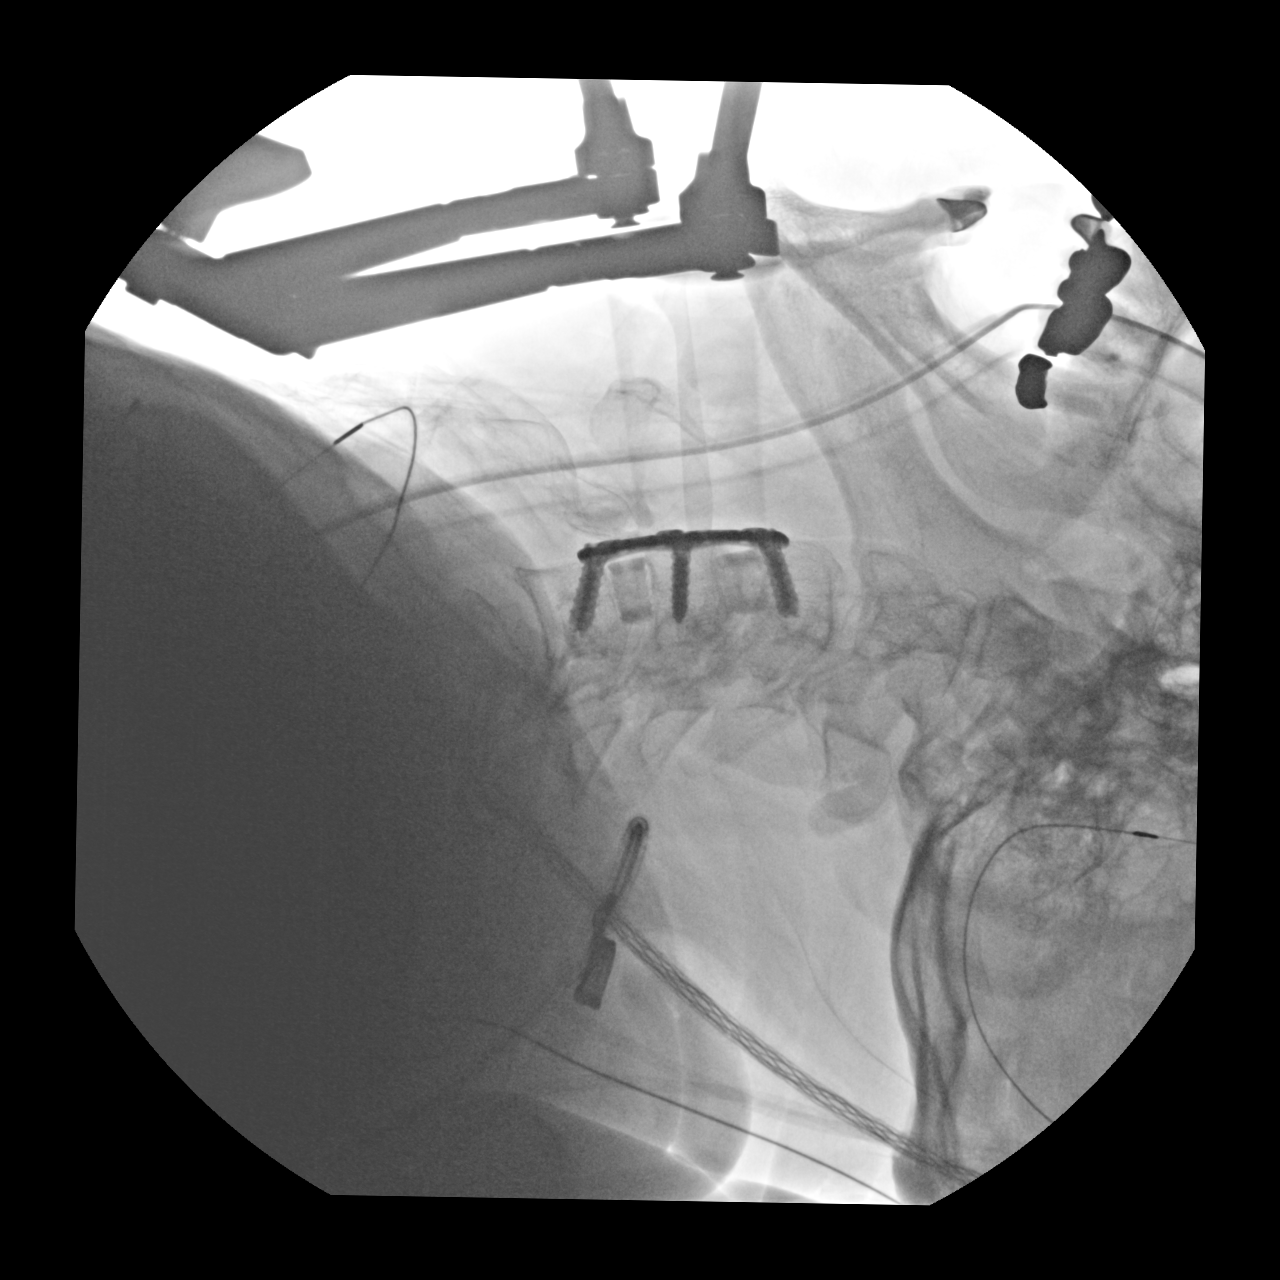

[2 of 2 positions shown; findings below may reference images not displayed]

FINDINGS: Two C-arm fluoroscopic images were obtained intraoperatively and
submitted for post operative interpretation. The first image
demonstrates a needle tip projecting at the C4-C5 interbody space
anteriorly. The second image demonstrates C3-C5 ACDF with plate and
screws and intervening spacers. Please see the performing provider's
procedural report for further detail.
IMPRESSION: Intraoperative fluoroscopy, as detailed above.

## 2020-12-01 IMAGING — CR DG CERVICAL SPINE 2 OR 3 VIEWS
2 series · 2 of 2 positions shown · non-contrast
Comparison: Preoperative CT.

CLINICAL DATA: Postop ACDF C3-C4-C4-C5

EXAM:
CERVICAL SPINE - 2-3 VIEW

[c-spine lat]
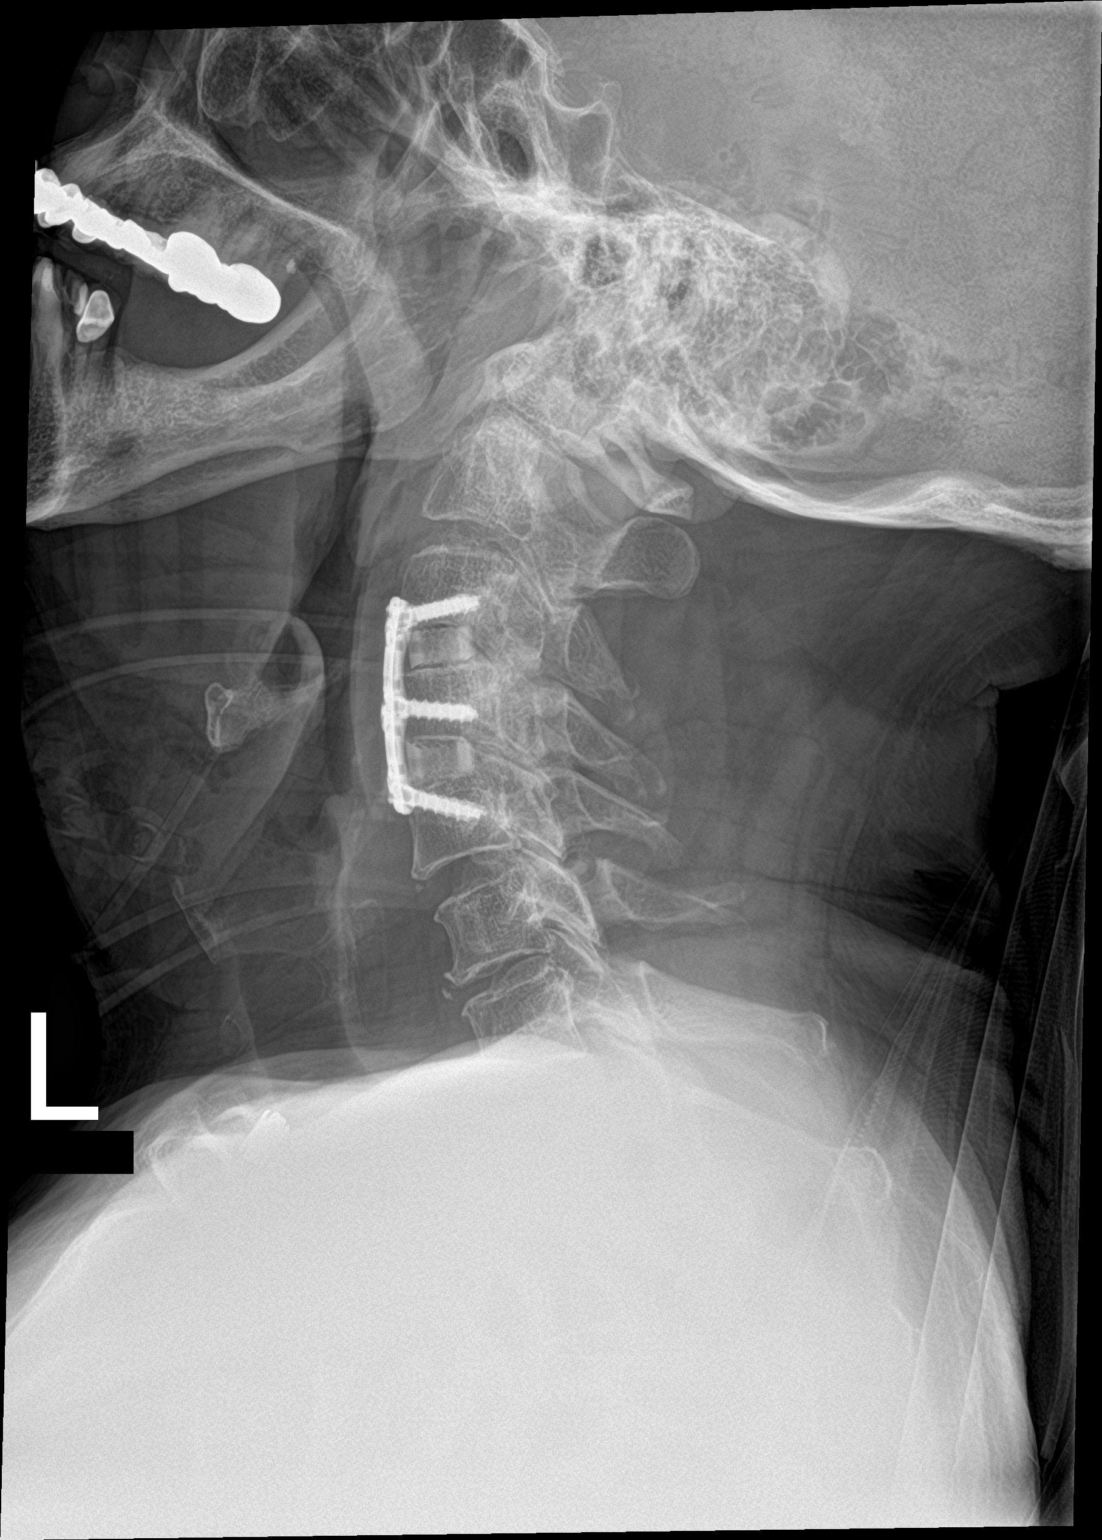

[c-spine ap]
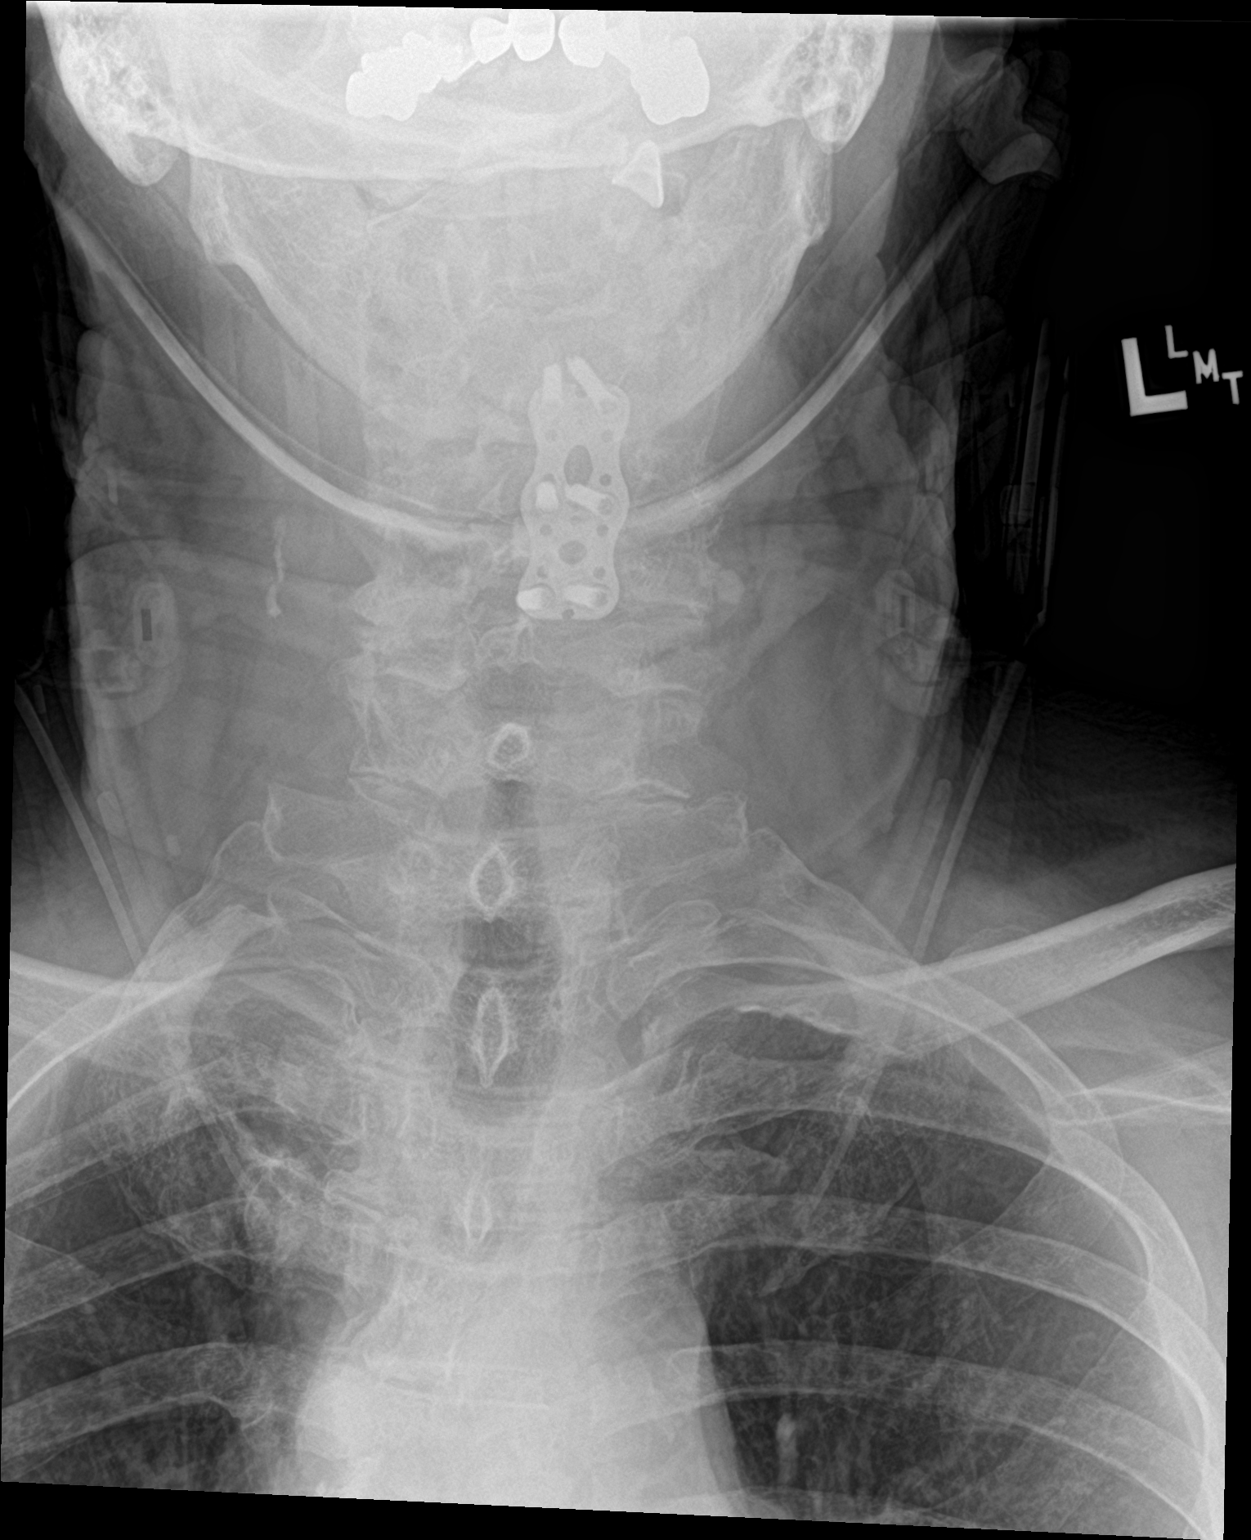

[2 of 2 positions shown; findings below may reference images not displayed]

FINDINGS: Anterior fusion C3 through C5 with interbody spacers. Intact
hardware. No periprosthetic lucency. Normal alignment. Overlying
cervical collar in place.
IMPRESSION: Anterior fusion C3 through C5 with interbody spacers. No immediate
postoperative complication.

## 2020-12-01 SURGERY — ANTERIOR CERVICAL DECOMPRESSION/DISCECTOMY FUSION 2 LEVELS
Anesthesia: General

## 2020-12-01 MED ORDER — ACETAMINOPHEN 10 MG/ML IV SOLN
INTRAVENOUS | Status: DC | PRN
  Administered 2020-12-01: 1000 mg via INTRAVENOUS

## 2020-12-01 MED ORDER — LIDOCAINE-EPINEPHRINE 1 %-1:100000 IJ SOLN
INTRAMUSCULAR | Status: DC | PRN
  Administered 2020-12-01: 5 mL

## 2020-12-01 MED ORDER — ONDANSETRON HCL 4 MG/2ML IJ SOLN: 4.0000 mg | Freq: Once | INTRAMUSCULAR | Status: DC | PRN

## 2020-12-01 MED ORDER — HYDROMORPHONE HCL 1 MG/ML IJ SOLN
INTRAMUSCULAR | Status: DC | PRN
  Administered 2020-12-01 (×2): .5 mg via INTRAVENOUS

## 2020-12-01 MED ORDER — SODIUM CHLORIDE 0.9% FLUSH
3.0000 mL | INTRAVENOUS | Status: DC | PRN
  Administered 2020-12-04: 3 mL via INTRAVENOUS

## 2020-12-01 MED ORDER — LIDOCAINE HCL (PF) 1 % IJ SOLN
INTRAMUSCULAR | Status: AC
  Filled 2020-12-01: qty 30

## 2020-12-01 MED ORDER — PROPOFOL 500 MG/50ML IV EMUL
INTRAVENOUS | Status: DC | PRN
  Administered 2020-12-01: 165 ug/kg/min via INTRAVENOUS

## 2020-12-01 MED ORDER — ACETAMINOPHEN 10 MG/ML IV SOLN
INTRAVENOUS | Status: AC
  Filled 2020-12-01: qty 100

## 2020-12-01 MED ORDER — GLYCOPYRROLATE 0.2 MG/ML IJ SOLN
INTRAMUSCULAR | Status: DC | PRN
  Administered 2020-12-01: .2 mg via INTRAVENOUS

## 2020-12-01 MED ORDER — SODIUM CHLORIDE 0.9 % IV SOLN
INTRAVENOUS | Status: DC | PRN
  Administered 2020-12-01: 25 ug/min via INTRAVENOUS

## 2020-12-01 MED ORDER — FENTANYL CITRATE (PF) 100 MCG/2ML IJ SOLN: 25.0000 ug | INTRAMUSCULAR | Status: DC | PRN

## 2020-12-01 MED ORDER — SODIUM CHLORIDE 0.9 % IV SOLN: 250.0000 mL | INTRAVENOUS | Status: DC

## 2020-12-01 MED ORDER — PROPOFOL 1000 MG/100ML IV EMUL
INTRAVENOUS | Status: AC
  Filled 2020-12-01: qty 100

## 2020-12-01 MED ORDER — MIDAZOLAM HCL 2 MG/2ML IJ SOLN
INTRAMUSCULAR | Status: DC | PRN
  Administered 2020-12-01 (×2): 1 mg via INTRAVENOUS

## 2020-12-01 MED ORDER — CEFAZOLIN SODIUM-DEXTROSE 1-4 GM/50ML-% IV SOLN
1.0000 g | Freq: Once | INTRAVENOUS | Status: AC
  Administered 2020-12-01: 1 g via INTRAVENOUS
  Filled 2020-12-01: qty 50

## 2020-12-01 MED ORDER — DEXAMETHASONE SODIUM PHOSPHATE 10 MG/ML IJ SOLN
INTRAMUSCULAR | Status: DC | PRN
  Administered 2020-12-01: 10 mg via INTRAVENOUS

## 2020-12-01 MED ORDER — MIDAZOLAM HCL 2 MG/2ML IJ SOLN
INTRAMUSCULAR | Status: AC
  Filled 2020-12-01: qty 2

## 2020-12-01 MED ORDER — LABETALOL HCL 5 MG/ML IV SOLN
INTRAVENOUS | Status: AC
  Filled 2020-12-01: qty 4

## 2020-12-01 MED ORDER — REMIFENTANIL HCL 1 MG IV SOLR
INTRAVENOUS | Status: AC
  Filled 2020-12-01: qty 1000

## 2020-12-01 MED ORDER — LIDOCAINE HCL (PF) 2 % IJ SOLN
INTRAMUSCULAR | Status: DC | PRN
  Administered 2020-12-01: 100 mg via INTRADERMAL

## 2020-12-01 MED ORDER — PHENYLEPHRINE HCL (PRESSORS) 10 MG/ML IV SOLN
INTRAVENOUS | Status: AC
  Filled 2020-12-01: qty 1

## 2020-12-01 MED ORDER — SODIUM CHLORIDE 0.9 % IV SOLN: INTRAVENOUS | Status: DC | PRN

## 2020-12-01 MED ORDER — FENTANYL CITRATE (PF) 100 MCG/2ML IJ SOLN
INTRAMUSCULAR | Status: AC
  Filled 2020-12-01: qty 2

## 2020-12-01 MED ORDER — SENNOSIDES-DOCUSATE SODIUM 8.6-50 MG PO TABS
1.0000 | ORAL_TABLET | Freq: Two times a day (BID) | ORAL | Status: DC
  Administered 2020-12-01 – 2020-12-04 (×6): 1 via ORAL
  Filled 2020-12-01 (×6): qty 1

## 2020-12-01 MED ORDER — PHENYLEPHRINE HCL (PRESSORS) 10 MG/ML IV SOLN
INTRAVENOUS | Status: DC | PRN
  Administered 2020-12-01: 200 ug via INTRAVENOUS

## 2020-12-01 MED ORDER — FENTANYL CITRATE (PF) 100 MCG/2ML IJ SOLN
INTRAMUSCULAR | Status: DC | PRN
  Administered 2020-12-01 (×2): 50 ug via INTRAVENOUS

## 2020-12-01 MED ORDER — PHENOL 1.4 % MT LIQD
1.0000 | OROMUCOSAL | Status: DC | PRN
  Administered 2020-12-02: 1 via OROMUCOSAL
  Filled 2020-12-01 (×2): qty 177

## 2020-12-01 MED ORDER — ROCURONIUM BROMIDE 100 MG/10ML IV SOLN
INTRAVENOUS | Status: DC | PRN
  Administered 2020-12-01: 30 mg via INTRAVENOUS

## 2020-12-01 MED ORDER — PROPOFOL 10 MG/ML IV BOLUS
INTRAVENOUS | Status: DC | PRN
  Administered 2020-12-01: 150 mg via INTRAVENOUS

## 2020-12-01 MED ORDER — REMIFENTANIL HCL 1 MG IV SOLR
INTRAVENOUS | Status: DC | PRN
  Administered 2020-12-01: .15 ug/kg/min via INTRAVENOUS
  Administered 2020-12-01: .1 ug/kg/min via INTRAVENOUS

## 2020-12-01 MED ORDER — THROMBIN 5000 UNITS EX SOLR
CUTANEOUS | Status: DC | PRN
  Administered 2020-12-01: 5000 [IU] via TOPICAL

## 2020-12-01 MED ORDER — SUCCINYLCHOLINE CHLORIDE 20 MG/ML IJ SOLN
INTRAMUSCULAR | Status: DC | PRN
  Administered 2020-12-01: 100 mg via INTRAVENOUS

## 2020-12-01 MED ORDER — LABETALOL HCL 5 MG/ML IV SOLN
5.0000 mg | INTRAVENOUS | Status: DC | PRN
  Administered 2020-12-01: 5 mg via INTRAVENOUS

## 2020-12-01 MED ORDER — CHLORHEXIDINE GLUCONATE CLOTH 2 % EX PADS
6.0000 | MEDICATED_PAD | Freq: Every day | CUTANEOUS | Status: DC
  Administered 2020-12-02 – 2020-12-03 (×3): 6 via TOPICAL

## 2020-12-01 MED ORDER — SUGAMMADEX SODIUM 200 MG/2ML IV SOLN
INTRAVENOUS | Status: DC | PRN
  Administered 2020-12-01: 300 mg via INTRAVENOUS

## 2020-12-01 MED ORDER — MENTHOL 3 MG MT LOZG
1.0000 | LOZENGE | OROMUCOSAL | Status: DC | PRN
  Administered 2020-12-02 – 2020-12-04 (×4): 3 mg via ORAL
  Filled 2020-12-01 (×3): qty 9

## 2020-12-01 MED ORDER — SODIUM CHLORIDE 0.9% FLUSH
3.0000 mL | Freq: Two times a day (BID) | INTRAVENOUS | Status: DC
  Administered 2020-12-01 – 2020-12-03 (×5): 3 mL via INTRAVENOUS

## 2020-12-01 MED ORDER — CEFAZOLIN SODIUM-DEXTROSE 1-4 GM/50ML-% IV SOLN
INTRAVENOUS | Status: AC
  Filled 2020-12-01: qty 50

## 2020-12-01 MED ORDER — ONDANSETRON HCL 4 MG/2ML IJ SOLN
INTRAMUSCULAR | Status: DC | PRN
  Administered 2020-12-01: 4 mg via INTRAVENOUS

## 2020-12-01 SURGICAL SUPPLY — 63 items
ALLOGRAFT LORDOTIC CC 7X11X14 (Bone Implant) ×2 IMPLANT
BIT DRILL 13 (BIT) IMPLANT
BIT DRILL ACP 13 (DRILL) IMPLANT
BLADE BOVIE TIP EXT 4 (BLADE) ×2 IMPLANT
BLADE SURG 15 STRL LF DISP TIS (BLADE) ×1 IMPLANT
BLADE SURG 15 STRL SS (BLADE) ×1
BUR DIAMOND COARSE 4.0 RND (BURR) ×2 IMPLANT
BUR NEURO DRILL SOFT 3.0X3.8M (BURR) ×2 IMPLANT
CANISTER SUCT 1200ML W/VALVE (MISCELLANEOUS) ×2 IMPLANT
CHLORAPREP W/TINT 26 (MISCELLANEOUS) ×4 IMPLANT
COUNTER NEEDLE 20/40 LG (NEEDLE) ×2 IMPLANT
COVER LIGHT HANDLE STERIS (MISCELLANEOUS) ×4 IMPLANT
COVER WAND RF STERILE (DRAPES) ×2 IMPLANT
CUP MEDICINE 2OZ PLAST GRAD ST (MISCELLANEOUS) ×4 IMPLANT
DERMABOND ADVANCED (GAUZE/BANDAGES/DRESSINGS) ×1
DERMABOND ADVANCED .7 DNX12 (GAUZE/BANDAGES/DRESSINGS) ×1 IMPLANT
DRAPE C-ARM 42X72 X-RAY (DRAPES) ×4 IMPLANT
DRAPE MICROSCOPE SPINE 48X150 (DRAPES) ×2 IMPLANT
DRAPE SURG 17X11 SM STRL (DRAPES) ×4 IMPLANT
DRAPE THYROID T SHEET (DRAPES) ×2 IMPLANT
DRILL ACP 13 (DRILL) ×2
ELECT CAUTERY BLADE TIP 2.5 (TIP) ×2
ELECT EZSTD 165MM 6.5IN (MISCELLANEOUS) ×2
ELECTRODE CAUTERY BLDE TIP 2.5 (TIP) ×1 IMPLANT
ELECTRODE EZSTD 165MM 6.5IN (MISCELLANEOUS) ×1 IMPLANT
FEE INTRAOP MONITOR IMPULS NCS (MISCELLANEOUS) IMPLANT
GAUZE SPONGE 4X4 12PLY STRL (GAUZE/BANDAGES/DRESSINGS) ×2 IMPLANT
GLOVE SRG 8 PF TXTR STRL LF DI (GLOVE) ×1 IMPLANT
GLOVE SURG SYN 7.0 (GLOVE) ×4 IMPLANT
GLOVE SURG SYN 7.0 PF PI (GLOVE) ×2 IMPLANT
GLOVE SURG SYN 8.0 (GLOVE) ×4 IMPLANT
GLOVE SURG SYN 8.0 PF PI (GLOVE) ×2 IMPLANT
GLOVE SURG UNDER LTX SZ7 (GLOVE) ×2 IMPLANT
GLOVE SURG UNDER POLY LF SZ8 (GLOVE) ×1
GOWN STRL REUS W/ TWL XL LVL3 (GOWN DISPOSABLE) ×2 IMPLANT
GOWN STRL REUS W/TWL XL LVL3 (GOWN DISPOSABLE) ×2
GRADUATE 1200CC STRL 31836 (MISCELLANEOUS) ×2 IMPLANT
INTRAOP MONITOR FEE IMPULS NCS (MISCELLANEOUS) ×1
INTRAOP MONITOR FEE IMPULSE (MISCELLANEOUS) ×1
IV CATH ANGIO 12GX3 LT BLUE (NEEDLE) ×2 IMPLANT
KIT TURNOVER KIT A (KITS) ×2 IMPLANT
MANIFOLD NEPTUNE II (INSTRUMENTS) ×2 IMPLANT
MARKER SKIN DUAL TIP RULER LAB (MISCELLANEOUS) ×4 IMPLANT
NDL SPNL 22GX3.5 QUINCKE BK (NEEDLE) ×1 IMPLANT
NEEDLE HYPO 22GX1.5 SAFETY (NEEDLE) ×2 IMPLANT
NEEDLE SPNL 22GX3.5 QUINCKE BK (NEEDLE) ×2 IMPLANT
NS IRRIG 1000ML POUR BTL (IV SOLUTION) ×2 IMPLANT
PACK LAMINECTOMY NEURO (CUSTOM PROCEDURE TRAY) ×2 IMPLANT
PAD ARMBOARD 7.5X6 YLW CONV (MISCELLANEOUS) ×2 IMPLANT
PIN CASPAR 14 (PIN) ×1 IMPLANT
PIN CASPAR 14MM (PIN) ×2 IMPLANT
PLATE ACP 1.6X38 2LVL (Plate) ×1 IMPLANT
SCREW ACP VA SD 3.5X15 (Screw) ×6 IMPLANT
SPOGE SURGIFLO 8M (HEMOSTASIS) ×1
SPONGE KITTNER 5P (MISCELLANEOUS) ×2 IMPLANT
SPONGE SURGIFLO 8M (HEMOSTASIS) ×1 IMPLANT
SUT POLYSORB 2-0 5X18 GS-10 (SUTURE) ×4 IMPLANT
SUT VICRYL 3-0 CR8 SH (SUTURE) ×2 IMPLANT
SYR 30ML LL (SYRINGE) ×2 IMPLANT
TAPE CLOTH 3X10 WHT NS LF (GAUZE/BANDAGES/DRESSINGS) ×2 IMPLANT
TOWEL OR 17X26 4PK STRL BLUE (TOWEL DISPOSABLE) ×4 IMPLANT
TRAY FOLEY MTR SLVR 16FR STAT (SET/KITS/TRAYS/PACK) IMPLANT
TUBING CONNECTING 10 (TUBING) ×2 IMPLANT

## 2020-12-01 NOTE — Plan of Care (Signed)
Pt alert and oriented x4, on room air, afebrile this shift. Pt denied nausea, SOB, pain, Headache or chest pain. Pt NPO midnight order. Falls precautions remained in place, call bell within reach. No acute concerns at this time Problem: Pain Managment: Goal: General experience of comfort will improve Outcome: Progressing   Problem: Safety: Goal: Ability to remain free from injury will improve Outcome: Progressing   Problem: Skin Integrity: Goal: Risk for impaired skin integrity will decrease Outcome: Progressing

## 2020-12-01 NOTE — Interval H&P Note (Signed)
Exam: CV: Regular rate and rhythm Pulm: Clear to auscultation  History and Physical Interval Note:  12/01/2020 9:08 AM  [name]  has presented today for surgery, with the diagnosis of g95.9 cervical myelopathy.  The various methods of treatment have been discussed with the patient and family. After consideration of risks, benefits and other options for treatment, the patient has consented to  Procedure(s): ANTERIOR CERVICAL DECOMPRESSION/DISCECTOMY FUSION 2 LEVELS C3-4, C4-5 (N/A) as a surgical intervention.  The patient's history has been reviewed, patient examined, no change in status, stable for surgery.  I have reviewed the patient's chart and labs.  Questions were answered to the patient's satisfaction.     Douglas Lyons

## 2020-12-01 NOTE — Progress Notes (Signed)
Patient POD0 from ACDF.  Doing well. R arm feels better, improved sensation.  MAEW with similar strength to preop.  Foley in place  - dc foley tomorrow - collar while OOB - elevate HOB - clears for now

## 2020-12-01 NOTE — Anesthesia Preprocedure Evaluation (Signed)
Anesthesia Evaluation  Patient identified by MRN, date of birth, ID band Patient awake    Reviewed: Allergy & Precautions, NPO status , Patient's Chart, lab work & pertinent test results  History of Anesthesia Complications Negative for: history of anesthetic complications  Airway Mallampati: II      Comment: Neck collar in place Dental  (+) Dental Advidsory Given   Pulmonary neg pulmonary ROS, neg shortness of breath, neg sleep apnea, neg COPD, neg recent URI, Not current smoker,           Cardiovascular Exercise Tolerance: Good hypertension, Pt. on medications (-) angina(-) Past MI and (-) CHF (-) dysrhythmias (-) Valvular Problems/Murmurs     Neuro/Psych neg Seizures negative neurological ROS  negative psych ROS   GI/Hepatic Neg liver ROS, neg GERD  ,  Endo/Other  diabetes, Type 2, Oral Hypoglycemic Agents  Renal/GU negative Renal ROS     Musculoskeletal   Abdominal   Peds  Hematology   Anesthesia Other Findings Past Medical History: No date: Cataracts, bilateral No date: Degenerative joint disease of knee, left No date: Diabetes mellitus, type 2 (HCC) No date: Diverticulosis No date: Erectile dysfunction No date: GERD (gastroesophageal reflux disease) No date: Heme positive stool No date: Hyperlipidemia No date: Hypertension 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 No date: Overweight (BMI 25.0-29.9)   Reproductive/Obstetrics                             Anesthesia Physical  Anesthesia Plan  ASA: III  Anesthesia Plan: General   Post-op Pain Management:    Induction: Intravenous  PONV Risk Score and Plan: 2 and Ondansetron, Dexamethasone and Treatment may vary due to age or medical condition  Airway Management Planned: Oral ETT and Video Laryngoscope Planned  Additional Equipment:   Intra-op Plan:   Post-operative Plan: Extubation in OR  Informed Consent: I have reviewed  the patients History and Physical, chart, labs and discussed the procedure including the risks, benefits and alternatives for the proposed anesthesia with the patient or authorized representative who has indicated his/her understanding and acceptance.       Plan Discussed with:   Anesthesia Plan Comments:         Anesthesia Quick Evaluation

## 2020-12-01 NOTE — Op Note (Signed)
Operative Note  SURGERY DATE:12/01/2020  PRE-OP DIAGNOSIS: Cervical spondylitic myelopathy  POST-OP DIAGNOSIS:Post-Op Diagnosis Codes: Cervical spondylotic myelopathy  Procedure(s) with comments: C3-4, C4-5 anterior cervical discectomy and fusion  SURGEON:  * Malen Gauze, MD Liliane Bade, PA Assistant  ANESTHESIA:General  OPERATIVE FINDINGS:Disc herniation and stenosis at C3-4 and C4-5 Indications Douglas Lyons presented to to the hospital on 4/3 with ongoing weakness over the past few weeks including balance difficulty with walking.  He had a MRI that showed disc herniations at C3-4 and C4-5 causing severe stenosis and cord signal hyperintensity. Given this, we recommended an anterior cervical decompression and fusion to relieve the pressure on the spinal cord. The risks of hematoma, infection, poor bone healing and failure of fusion, cord injury, weakness, numbness, neck pain, stroke, and death were discussed in detail. All questions were answered and the patient elected to proceed with the surgery.   Procedure After obtaining informed consent, the patient was taken to the Operating Room where general anesthesia was induced and the patient intubated. Vascular access was obtained. Decadron was administered.  A Foley catheter was placed electrodes were placed for MEP's and SSEPs.  He had a strong SSEPs but noted weak MEP's in the lower extremities and hands.  The head was slightly extended and imaging used to identify a skin crease overlying the C5 vertebral body using fluoroscopy.  After positioning, monitoring remained stable..   The patient was prepped and draped in the usual sterile fashion and a timeout was performed per protocol. Local anesthesia was instilled with epinephrine along the planned incision site. A transverse cervicalincision was performed on the left in a skin crease. The incision was carried to the level of the platysma and then cautery was  used to incise the muscle. Blunt dissection was used to expand the plane and the dissection was carried deep medial to the SCM and carotid sheath being careful to identify the trachea and esophagus medially. The prevertebral fascia was identified and this was bluntly dissected to expose the disc spaces. A needle was placed in the disc space and x-ray confirmed the C 3/4 disc level.   Next, cautery was used to undermine the longus colli muscles bilaterally and identify the C 3/4 and C4-5 disc spaces. Caspar pins were placed at C3 and C4 and a retractor system placed under the muscles to complete the exposure. Next, a combination of curettes and Kerrison rongeurs were used to remove the anterior osteophyte at C3-4 and then the disc material.  The drill was used to shave the endplates of the adjacent bodies.  The microscope was brought into the field for the remainder of the surgery. The drill was used to remove the osteophyte/disc complex deep to the level of the PLL at C3-4.  There was significant disc protrusion at this level that was removed with rongeurs. The PLL was entered with a hook and then the PLL was removed along with remaining disc material to decompress centrally and then out into bilateral neuro foramen. A blunt probe was used to confirm no residual stenosis laterally and a curette used to ensure no posterior osteophyte remained. A trial spacer was used to size the graft and then hemostasis obtained.A combination of Floseal and gelfoam was used for hemostasis. Allograft was placed, 46mm in height and placed slightly recessed to the anterior edge of vertebral body  The caspar pin was moved from C3 to C5 along with the retractors. The disc space was entered there sharply and the disc material  removed in a similar fashion at the C4-5 level. The endplates were prepared using the hgh speed drill and then the posterior disc/osteophyte complex also removed in similar fashion. The PLL was entered with a  hook and then the PLL was removed along with remaining disc material to decompress centrally and then out into bilateral neuro foramen. A blunt probe was used to confirm no residual stenosis laterally and a curette used to ensure no posterior osteophyte remained.  A trial spacer was used to size the graft. A combination of Floseal and gelfoam was used for hemostasis.  Allograft was placed, 56mm in height and placed slightly recessed to the anterior edge of vertebral body  X-ray confirmed good placement of grafts.  The caspar pins were removed and bone wax placed. The remainder of the osteophytes were drilled to allow for plating. Next, a 16mm plate was found to be the adequate size and was placed in the midline and secured with two 45mm screws at each bone level. Xray was obtained confirming good graft placement and adequate depth of screws. The retractors were removed. The wound was irrigated copiously and hemostasis obtained. The platysma was closed with 2-0 vicryl suture. The dermis was closed with 3-0 Vicryl and Dermabond was placed on the skin.  The patient had general anesthesia reversed and was extubated following the procedure. He awoke following commands with symmetric movement. He was taken to the PACU where he continued his recovery and then the ward.      ESTIMATED BLOOD LOSS: 50cc  SPECIMENS None  IMPLANT [employer] CC Allograft  Inventory Item:  Serial no.: [id number] Model/Cat no.: [id number]  Implant name: [employer] CC Allograft Laterality: N/A Area: Spine Cervical  Manufacturer: [employer] Date of Manufacture:    Action: Implanted Number Used: 1   Device Identifier:  Device Identifier Type:     [employer] Allograft   Inventory Item:  Serial no.: [id number] Model/Cat no.: [id number]  Implant name: [employer] CC Allograft  Laterality: N/A Area: Spine Cervical  Manufacturer: [employer] Date of Manufacture:    Action: Implanted Number Used: 1   Device Identifier:  Device Identifier  Type:     SCREW ACP VA SD 3.5X15 - [id number]  Inventory Item: SCREW ACP VA SD 3.5X15 Serial no.:  Model/Cat no.: [id number]  Implant name: SCREW ACP VA SD 3.5X15 - [id number] Laterality: N/A Area: Spine Cervical  Manufacturer: [employer] Date of Manufacture:    Action: Implanted Number Used: 6   Device Identifier:  Device Identifier Type:     PLATE ACP 8.5U31 2LVL - [id number]  Inventory Item: PLATE ACP 5.8I50 2LVL Serial no.:  Model/Cat no.: [id number]  Implant name: PLATE ACP 8.6V67 2LVL - [id number] Laterality: N/A Area: Spine Cervical  Manufacturer: [employer] Date of Manufacture:    Action: Implanted Number Used: 1   Device Identifier:  Device Identifier Type:        I performed the case in its entiretywith assistance of PA, Liliane Bade  Deetta Perla, [place]

## 2020-12-01 NOTE — Anesthesia Procedure Notes (Addendum)
Procedure Name: Intubation Performed by: Fletcher-Harrison, Ami Thornsberry, CRNA Pre-anesthesia Checklist: Patient identified, Emergency Drugs available, Suction available and Patient being monitored Patient Re-evaluated:Patient Re-evaluated prior to induction Oxygen Delivery Method: Circle system utilized Preoxygenation: Pre-oxygenation with 100% oxygen Induction Type: IV induction Ventilation: Mask ventilation without difficulty Laryngoscope Size: McGraph and 3 Grade View: Grade I Tube type: Oral Tube size: 7.0 mm Number of attempts: 1 Airway Equipment and Method: Stylet and Oral airway Placement Confirmation: ETT inserted through vocal cords under direct vision,  positive ETCO2,  breath sounds checked- equal and bilateral and CO2 detector Secured at: 21 cm Tube secured with: Tape Dental Injury: Teeth and Oropharynx as per pre-operative assessment        

## 2020-12-01 NOTE — Transfer of Care (Signed)
Immediate Anesthesia Transfer of Care Note  Patient: Douglas Lyons  Procedure(s) Performed: ANTERIOR CERVICAL DECOMPRESSION/DISCECTOMY FUSION 2 LEVELS C3-4, C4-5 (N/A )  Patient Location: PACU  Anesthesia Type:General  Level of Consciousness: awake, drowsy and patient cooperative  Airway & Oxygen Therapy: Patient Spontanous Breathing and Patient connected to face mask oxygen  Post-op Assessment: Report given to RN and Post -op Vital signs reviewed and stable  Post vital signs: Reviewed and stable  Last Vitals:  Vitals Value Taken Time  BP 151/79 12/01/20 1401  Temp    Pulse 76 12/01/20 1413  Resp 20 12/01/20 1413  SpO2 100 % 12/01/20 1413  Vitals shown include unvalidated device data.  Last Pain:  Vitals:   12/01/20 0929  TempSrc:   PainSc: 0-No pain         Complications: No complications documented.

## 2020-12-01 NOTE — Anesthesia Procedure Notes (Signed)
Arterial Line Insertion Performed by: Kelton Pillar, CRNA, CRNA  Patient location: OR. Preanesthetic checklist: patient identified, IV checked, risks and benefits discussed, surgical consent and pre-op evaluation Left, radial was placed Catheter size: 20 G Hand hygiene performed , maximum sterile barriers used  and Seldinger technique used  Attempts: 1 Procedure performed without using ultrasound guided technique. Following insertion, dressing applied. Patient tolerated the procedure well with no immediate complications. Additional procedure comments: Pt with pre-op increased numbness/tingling sensations in bilateral hands with left >right on pre-op assessment, TFH.

## 2020-12-01 NOTE — Care Management Important Message (Signed)
Important Message  Patient Details  Name: ELIUS ETHEREDGE MRN: [id number] Date of Birth: 1946-03-24   Medicare Important Message Given:  Yes     Dannette Barbara 12/01/2020, 10:56 AM

## 2020-12-01 NOTE — Interval H&P Note (Signed)
We will proceed with C3/4, C4/5 ACDF, all risks explained and patient ready to proceed.   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  The various methods of treatment have been discussed with the patient and family. After consideration of risks, benefits and other options for treatment, the patient has consented to  Procedure(s): ANTERIOR CERVICAL DECOMPRESSION/DISCECTOMY FUSION 2 LEVELS C3-4, C4-5 (N/A) as a surgical intervention.  The patient's history has been reviewed, patient examined, no change in status, stable for surgery.  I have reviewed the patient's chart and labs.  Questions were answered to the patient's satisfaction.     Deetta Perla

## 2020-12-01 NOTE — Anesthesia Postprocedure Evaluation (Signed)
Anesthesia Post Note  Patient: Douglas Lyons  Procedure(s) Performed: ANTERIOR CERVICAL DECOMPRESSION/DISCECTOMY FUSION 2 LEVELS C3-4, C4-5 (N/A )  Patient location during evaluation: PACU Anesthesia Type: General Level of consciousness: awake and alert Pain management: pain level controlled Vital Signs Assessment: post-procedure vital signs reviewed and stable Respiratory status: spontaneous breathing, nonlabored ventilation, respiratory function stable and patient connected to nasal cannula oxygen Cardiovascular status: blood pressure returned to baseline and stable Postop Assessment: no apparent nausea or vomiting Anesthetic complications: no   No complications documented.   Last Vitals:  Vitals:   12/01/20 1500 12/01/20 1515  BP: (!) 145/74 (!) 155/73  Pulse: 75 70  Resp: 16   Temp:    SpO2: 95% 96%    Last Pain:  Vitals:   12/01/20 1515  TempSrc:   PainSc: 0-No pain                 Martha Clan

## 2020-12-02 LAB — BASIC METABOLIC PANEL
Anion gap: 11 (ref 5–15)
BUN: 16 mg/dL (ref 8–23)
CO2: 24 mmol/L (ref 22–32)
Calcium: 9.1 mg/dL (ref 8.9–10.3)
Chloride: 100 mmol/L (ref 98–111)
Creatinine, Ser: 0.87 mg/dL (ref 0.61–1.24)
GFR, Estimated: >60 mL/min (ref >60)
Glucose, Bld: 197 mg/dL — ABNORMAL HIGH (ref 70–99)
Potassium: 4.5 mmol/L (ref 3.5–5.1)
Sodium: 135 mmol/L (ref 135–145)

## 2020-12-02 LAB — GLUCOSE, CAPILLARY
Glucose-Capillary: 185 mg/dL — ABNORMAL HIGH (ref 70–99)
Glucose-Capillary: 163 mg/dL — ABNORMAL HIGH (ref 70–99)
Glucose-Capillary: 203 mg/dL — ABNORMAL HIGH (ref 70–99)
Glucose-Capillary: 182 mg/dL — ABNORMAL HIGH (ref 70–99)

## 2020-12-02 NOTE — Progress Notes (Signed)
Physical Therapy Treatment Patient Details Name: Douglas Lyons MRN: [id number] DOB: 1946-05-02 Today's Date: 12/02/2020    History of Present Illness Pt is a 75 yo M who comes to [facility] on 11/26/20 with over 3 weeks of worsening numbness in his hands bilaterally, left greater than right.  He additionally states he is had some difficulty with balance and UE weakness. 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 PMH includes DM and HTN.    PT Comments    Pt was long sitting in bed with cervical collar donned and supportive family members in room. Needs a little encouragement to participate but eventually agrees to OOB to recliner. Requested not to ambulate due to pain. RN notified. Pt performed log roll R to short sit without physical assistance. Did require vcs for improved technique. Reviewed spinal precautions and discussed DC disposition. Pt overall is doing well. He will benefit from continued skilled PT at DC to address deficits while assisting pt to PLOF.     Follow Up Recommendations  Home health PT     Equipment Recommendations  None recommended by PT       Precautions / Restrictions Precautions Precautions: Fall;Cervical Precaution Booklet Issued: Yes (comment) Required Braces or Orthoses: Cervical Brace Cervical Brace: Hard collar Restrictions Weight Bearing Restrictions: No Other Position/Activity Restrictions: [place]en cervical collar when OOB; elevate HOB    Mobility  Bed Mobility Overal bed mobility: Needs Assistance Bed Mobility: Supine to Sit;Sit to Supine     Supine to sit: Supervision Sit to supine: Supervision   General bed mobility comments: log roll  with supervision + vcs for improve technique    Transfers Overall transfer level: Needs assistance Equipment used: Rolling walker (2 wheeled) Transfers: Sit to/from Stand Sit to Stand: Supervision        Lateral/Scoot  Transfers: Supervision General transfer comment: Pt demonstrated safe ability to stand at EOB. did require author holding urnal while peeing due to balance but overall did well.  Ambulation/Gait Ambulation/Gait assistance: Supervision Gait Distance (Feet): 5 Feet Assistive device: Rolling walker (2 wheeled)   Gait velocity: decreased   General Gait Details: distance limited by pain. " I have not sleep in 24 hours. can you come back in morning."       Balance Overall balance assessment: Needs assistance Sitting-balance support: No upper extremity supported;Feet supported Sitting balance-Leahy Scale: Good Sitting balance - Comments: no balance deficis in sitting   Standing balance support: Bilateral upper extremity supported;During functional activity Standing balance-Leahy Scale: Fair         Cognition Arousal/Alertness: Awake/alert Behavior During Therapy: WFL for tasks assessed/performed Overall Cognitive Status: Within Functional Limits for tasks assessed      General Comments: Pt is A and O x 4      Exercises Other Exercises Other Exercises: Pt instructed in cervical precautions and how to maintain during bed mobility and ADL tasks, falls prevention,  collar mgt, and home/routines modifications and AE/DME; handout provided    General Comments General comments (skin integrity, edema, etc.): discussed spinal precautions and need to mobilize throughout the day. both pt/ pt's supportive family state understanding      Pertinent Vitals/Pain Pain Assessment: 0-10 Pain Score: 7  Pain Location: neck Pain Descriptors / Indicators: Contraction;Discomfort;Spasm Pain Intervention(s): Limited activity within patient's tolerance;Monitored during session;Patient requesting pain meds-RN notified;Repositioned    Home Living Family/patient expects to be discharged to:: Private residence Living Arrangements: Spouse/significant  other Available Help at Discharge: Family;Available  24 hours/day;Friend(s) Type of Home: House Home Access: Stairs to enter Entrance Stairs-Rails: Right;Left;Can reach both Home Layout: One level Home Equipment: Environmental consultant - 2 wheels;Walker - 4 wheels;Hand held shower head;Adaptive equipment Additional Comments: brother has a rollator for patient to borrow    Prior Function Level of Independence: Independent      Comments: Ind amb community distances without an AD, Ind with ADLs, 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, retired Airline pilot, 2 falls in the last 6 months   PT Goals (current goals can now be found in the care plan section) Acute Rehab PT Goals Patient Stated Goal: go home Progress towards PT goals: Progressing toward goals    Frequency    BID      PT Plan Current plan remains appropriate       AM-PAC PT "6 Clicks" Mobility   Outcome Measure  Help needed turning from your back to your side while in a flat bed without using bedrails?: None Help needed moving from lying on your back to sitting on the side of a flat bed without using bedrails?: None Help needed moving to and from a bed to a chair (including a wheelchair)?: A Little Help needed standing up from a chair using your arms (e.g., wheelchair or bedside chair)?: A Little Help needed to walk in hospital room?: A Little Help needed climbing 3-5 steps with a railing? : A Little 6 Click Score: 20    End of Session Equipment Utilized During Treatment: Gait belt;Cervical collar Activity Tolerance: Patient tolerated treatment well Patient left: in chair;with call bell/phone within reach;with chair alarm set;with family/visitor present Nurse Communication: Mobility status PT Visit Diagnosis: Unsteadiness on feet (R26.81);Difficulty in walking, not elsewhere classified (R26.2);Other abnormalities of gait and mobility (R26.89);Muscle weakness (generalized) (M62.81);Ataxic gait (R26.0);Other symptoms and signs involving the nervous system (R29.898);History of  falling (Z91.81)     Time: 0263-7858 PT Time Calculation (min) (ACUTE ONLY): 15 min  Charges:  $Therapeutic Activity: 8-22 mins                     Julaine Fusi PTA 12/02/20, 5:30 PM

## 2020-12-02 NOTE — Progress Notes (Signed)
Identifying Statement: Douglas Lyons is a 75 y.o. male from [place] [id number] with numbness and balance difficulty  Physician Requesting Consultation: [facility]  Daily events: 12/02/2020: Working with PT.  Tolerated clears.  Still with some difficulty swallowing, but able to take pills. Could ambulate with PT today. 12/01/2020: POD0 for ACDF C3-5 11/30/2020: Working with physical therapy. Medically optimized.  11/29/2020: No new events.   History of Present Illness: Douglas Lyons presents with over 3 weeks of worsening numbness in his hands bilaterally, left greater than right.  He additionally states he is had some difficulty with balance.  He is right-handed but has noted some decrease in strength over the left hand and arm.  The numbness does appear to travel up the arm towards the shoulder.  On the right side, he remains in the hand.  Overall, he does feel like this is worsening.  He does not report any inciting event and denies any falls or traumatic incidents prior to the onset.  He does not report any significant neck pain.  He did get a MRI and CT scan of the cervical spine in the emergency department.  Past Medical History:      Past Medical History:  Diagnosis Date  . Cataracts, bilateral   . Degenerative joint disease of knee, left   . Diabetes mellitus, type 2 ([place])   . Diverticulosis   . Erectile dysfunction   . GERD (gastroesophageal reflux disease)   . Heme positive stool   . Hyperlipidemia   . Hypertension   . Lumbar disc herniation   . Overweight (BMI 25.0-29.9)     Social History: Social History        Socioeconomic History  . Marital status: Married    Spouse name: Not on file  . Number of children: Not on file  . Years of education: Not on file  . Highest education level: Not on file  Occupational History  . Not on file  Tobacco Use  . Smoking status: Never Smoker  . Smokeless tobacco: Never Used  Vaping  Use  . Vaping Use: Never used  Substance and Sexual Activity  . Alcohol use: Yes    Comment: OCCASIONAL   . Drug use: Not Currently  . Sexual activity: Not on file  Other Topics Concern  . Not on file  Social History Narrative  . Not on file   Social Determinants of Health   Financial Resource Strain: Not on file  Food Insecurity: Not on file  Transportation Needs: Not on file  Physical Activity: Not on file  Stress: Not on file  Social Connections: Not on file  Intimate Partner Violence: Not on file    Family History: History reviewed. No pertinent family history.  Review of Systems:  Review of Systems - General ROS: Negative Psychological ROS: Negative Ophthalmic ROS: Negative ENT ROS: Negative Hematological and Lymphatic ROS: Negative  Endocrine ROS: Negative Respiratory ROS: Negative Cardiovascular ROS: Negative Gastrointestinal ROS: Negative Genito-Urinary ROS: Negative Musculoskeletal ROS: Negative for neck pain Neurological ROS: Positive for weakness, numbness Dermatological ROS: Negative  Physical Exam: Vitals:   12/02/20 0612 12/02/20 0700  BP: (!) 178/87 (!) 165/94  Pulse: 79 73  Resp: 16 20  Temp: 97.6 F (36.4 C) 98.1 F (36.7 C)  SpO2: 99% 98%      General appearance: Alert, cooperative, in no acute distress Head: Normocephalic, atraumatic Eyes: Normal, EOM intact Oropharynx: Moist without lesions Neck: Supple, range of motion appears full with  no increase symptoms on extension Ext: No edema in extremities  Neurologic exam:  Mental status: alertness: alert,  affect: normal Speech: fluent and clear Motor:strength symmetric 5/5 in bilateral upper and lower extremities with exception of 4+ in left interossei and grip Sensory: Decreased light touch in bilateral hands as well as the forearm and distal arm on the left, peers intact in lower extremities Gait: Not tested  Laboratory:       Results for orders placed or performed  during the hospital encounter of 11/26/20  Resp Panel by RT-PCR (Flu A&B, Covid) Nasopharyngeal Swab   Specimen: Nasopharyngeal Swab; Nasopharyngeal(NP) swabs in vial transport medium  Result Value Ref Range   SARS Coronavirus 2 by RT PCR NEGATIVE NEGATIVE   Influenza A by PCR NEGATIVE NEGATIVE   Influenza B by PCR NEGATIVE NEGATIVE  Protime-INR  Result Value Ref Range   Prothrombin Time 13.1 11.4 - 15.2 seconds   INR 1.0 0.8 - 1.2  APTT  Result Value Ref Range   aPTT 29 24 - 36 seconds  CBC  Result Value Ref Range   WBC 6.5 4.0 - 10.5 K/uL   RBC 4.86 4.22 - 5.81 MIL/uL   Hemoglobin 14.4 13.0 - 17.0 g/dL   HCT 43.3 39.0 - 52.0 %   MCV 89.1 80.0 - 100.0 fL   MCH 29.6 26.0 - 34.0 pg   MCHC 33.3 30.0 - 36.0 g/dL   RDW 12.9 11.5 - 15.5 %   Platelets 214 150 - 400 K/uL   nRBC 0.0 0.0 - 0.2 %  Differential  Result Value Ref Range   Neutrophils Relative % 73 %   Neutro Abs 4.7 1.7 - 7.7 K/uL   Lymphocytes Relative 16 %   Lymphs Abs 1.0 0.7 - 4.0 K/uL   Monocytes Relative 9 %   Monocytes Absolute 0.6 0.1 - 1.0 K/uL   Eosinophils Relative 1 %   Eosinophils Absolute 0.1 0.0 - 0.5 K/uL   Basophils Relative 1 %   Basophils Absolute 0.0 0.0 - 0.1 K/uL   Immature Granulocytes 0 %   Abs Immature Granulocytes 0.02 0.00 - 0.07 K/uL  Comprehensive metabolic panel  Result Value Ref Range   Sodium 137 135 - 145 mmol/L   Potassium 4.0 3.5 - 5.1 mmol/L   Chloride 103 98 - 111 mmol/L   CO2 25 22 - 32 mmol/L   Glucose, Bld 150 (H) 70 - 99 mg/dL   BUN 16 8 - 23 mg/dL   Creatinine, Ser 0.69 0.61 - 1.24 mg/dL   Calcium 9.2 8.9 - 10.3 mg/dL   Total Protein 7.1 6.5 - 8.1 g/dL   Albumin 4.2 3.5 - 5.0 g/dL   AST 12 (L) 15 - 41 U/L   ALT 12 0 - 44 U/L   Alkaline Phosphatase 58 38 - 126 U/L   Total Bilirubin 0.8 0.3 - 1.2 mg/dL   GFR, Estimated >60 >60 mL/min   Anion gap 9 5 - 15  CBG monitoring, ED  Result Value Ref Range   Glucose-Capillary  154 (H) 70 - 99 mg/dL  Type and screen [place]  Result Value Ref Range   ABO/RH(D) O NEG    Antibody Screen NEG    Sample Expiration      11/30/2020,2359 Performed at [facility], [address] [place], [place] [address]    I personally reviewed labs  Imaging: MRI cervical spine:Degenerative disc osteophyte at C3-4 with resultant severe spinal stenosis and flattening of the  cervical spinal cord. 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 Additional degenerative spondylosis at C4-5 and C5-6 with resultant moderate spinal stenosis.  Multifactorial degenerative changes with resultant multilevel foraminal narrowing as above. Notable findings include moderate left C3 foraminal stenosis, with severe bilateral C4, C5, C6, and left C7 foraminal narrowing.  CT Cervical Spine: No acute fracture, malalignment or bony lesion. 2. Multilevel cervical spondylosis and right worse than left facet arthropathy. Please see MRI of the cervical spine obtained earlier today for further detail of underlying neural impingement.  Postop xrays - good position of implants   Impression/Plan:  Mr. Haapala is here with cervical myelopathy, now POD1 from ACDF C3-5.  He is improving.  1.  Diagnosis: Cervical myelopathy  2.  Plan - Continue PT - continue [place] J collar when OOB - Advance to soft diet - Dispo planning  Meade Maw MD Neurosurgery

## 2020-12-02 NOTE — Evaluation (Signed)
Physical Therapy Re-Evaluation Patient Details Name: Douglas Lyons MRN: [id number] DOB: 25-Dec-1945 Today's Date: 12/02/2020   History of Present Illness  Pt is a 75 yo M who comes to [facility] on 11/26/20 with over 3 weeks of worsening numbness in his hands bilaterally, left greater than right.  He additionally states he is had some difficulty with balance and UE weakness. 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 PMH includes DM and HTN.    Clinical Impression  Pt was pleasant and motivated to participate during the session and overall performed well during the session.  Pt's sensation to light touch was grossly intact to BLE's with pt stating he noticed a significant improvement in sensation compared to pre-surgery. Pt's BUE grip strength, especially on the L, was also significantly improved compared to pre-surgical levels.  Pt was able to amb 125 feet with a RW with mild ataxia occasionally but was steady without physical assist needed to prevent LOB.  Pt was able to ascend and descend 4 steps per below with extra time and effort and cues for sequencing but did not require physical assistance. Pt will benefit from HHPT upon discharge to safely address deficits listed in patient problem list for decreased caregiver assistance and eventual return to PLOF.         Follow Up Recommendations Home health PT; supervision with mobility    Equipment Recommendations  None recommended by PT    Recommendations for Other Services       Precautions / Restrictions Precautions Precautions: Fall;Cervical Required Braces or Orthoses: Cervical Brace Cervical Brace: Hard collar Restrictions Weight Bearing Restrictions: No Other Position/Activity Restrictions: Aspen cervical collar when OOB; elevate HOB      Mobility  Bed Mobility               General bed mobility comments: NT, pt in recliner     Transfers Overall transfer level: Needs assistance Equipment used: Rolling walker (2 wheeled) Transfers: Sit to/from Stand Sit to Stand: Supervision         General transfer comment: Good eccentric and concentric control and stability  Ambulation/Gait Ambulation/Gait assistance: Min guard Gait Distance (Feet): 125 Feet Assistive device: Rolling walker (2 wheeled) Gait Pattern/deviations: Step-through pattern;Decreased step length - right;Decreased step length - left;Trunk flexed Gait velocity: decreased   General Gait Details: Mildly ataxic but steady with cues for upright posture and amb closer to the RW most notably during sharp turns  Stairs Stairs: Yes Stairs assistance: Min guard Stair Management: Two rails;Alternating pattern;Backwards;Forwards Number of Stairs: 4 General stair comments: Pt able to ascend forwards and descend backwards 4 steps with B rails with fair eccentric and concentric control and close CGA  Wheelchair Mobility    Modified Rankin (Stroke Patients Only)       Balance Overall balance assessment: Needs assistance Sitting-balance support: No upper extremity supported;Feet supported Sitting balance-Leahy Scale: Good     Standing balance support: Bilateral upper extremity supported;During functional activity Standing balance-Leahy Scale: Fair Standing balance comment: Min to mod lean on the RW for support but no LOB during the session                             Pertinent Vitals/Pain Pain Assessment: No/denies pain    Home Living Family/patient expects to be discharged to:: Private residence Living Arrangements: Spouse/significant other Available Help at Discharge: Family;Available 24  hours/day;Friend(s) Type of Home: House Home Access: Stairs to enter Entrance Stairs-Rails: Right;Left;Can reach both Entrance Stairs-Number of Steps: 3 Home Layout: One level Home Equipment: Walker - 2 wheels;Walker - 4 wheels Additional  Comments: brother has a rollator for patient to borrow    Prior Function Level of Independence: Independent         Comments: Ind amb community distances without an AD, Ind with ADLs, 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, 2 falls in the last 6 months     Hand Dominance   Dominant Hand: Right    Extremity/Trunk Assessment   Upper Extremity Assessment Upper Extremity Assessment: RUE deficits/detail;LUE deficits/detail RUE Deficits / Details: Sensation to light touch grossly intact, thumb to fingers sequential opposition slow but with good control, good grip strength LUE Deficits / Details: Sensation to light touch grossly intact, thumb to fingers sequential opposition slower than right but with good control, good grip strength    Lower Extremity Assessment Lower Extremity Assessment: Generalized weakness    Cervical / Trunk Assessment Cervical / Trunk Assessment: Normal  Communication   Communication: No difficulties  Cognition Arousal/Alertness: Awake/alert Behavior During Therapy: WFL for tasks assessed/performed Overall Cognitive Status: Within Functional Limits for tasks assessed                                        General Comments      Exercises Total Joint Exercises Ankle Circles/Pumps: AROM;Strengthening;Both;10 reps Quad Sets: Strengthening;Both;10 reps Gluteal Sets: 10 reps;Both;Strengthening Hip ABduction/ADduction: Strengthening;Both;10 reps Straight Leg Raises: Strengthening;Both;10 reps Long Arc Quad: Strengthening;Both;10 reps Knee Flexion: Strengthening;Both;10 reps Marching in Standing: AROM;Strengthening;Both;5 reps;Standing Other Exercises Other Exercises: HEP education for BLE APs, QS, GS, and LAQs x 10 every 1-2 hours   Assessment/Plan    PT Assessment Patient needs continued PT services  PT Problem List Decreased strength;Decreased activity tolerance;Decreased balance;Decreased mobility;Decreased knowledge of use  of DME       PT Treatment Interventions DME instruction;Gait training;Stair training;Therapeutic activities;Functional mobility training;Balance training;Patient/family education;Therapeutic exercise    PT Goals (Current goals can be found in the Care Plan section)  Acute Rehab PT Goals Patient Stated Goal: To be active again and get back to work PT Goal Formulation: With patient Time For Goal Achievement: 12/15/20 Potential to Achieve Goals: Good    Frequency BID   Barriers to discharge        Co-evaluation               AM-PAC PT "6 Clicks" Mobility  Outcome Measure Help needed turning from your back to your side while in a flat bed without using bedrails?: None Help needed moving from lying on your back to sitting on the side of a flat bed without using bedrails?: None Help needed moving to and from a bed to a chair (including a wheelchair)?: A Little Help needed standing up from a chair using your arms (e.g., wheelchair or bedside chair)?: A Little Help needed to walk in hospital room?: A Little Help needed climbing 3-5 steps with a railing? : A Little 6 Click Score: 20    End of Session Equipment Utilized During Treatment: Gait belt;Cervical collar Activity Tolerance: Patient tolerated treatment well Patient left: in chair;with call bell/phone within reach;with chair alarm set;with family/visitor present Nurse Communication: Mobility status PT Visit Diagnosis: Unsteadiness on feet (R26.81);Difficulty in walking, not elsewhere classified (R26.2);Other abnormalities of gait and mobility (R26.89);Muscle  weakness (generalized) (M62.81);Ataxic gait (R26.0);Other symptoms and signs involving the nervous system (R29.898);History of falling (Z91.81)    Time: 9672-8979 PT Time Calculation (min) (ACUTE ONLY): 34 min   Charges:   PT Evaluation $PT Re-evaluation: 1 Re-eval PT Treatments $Gait Training: 8-22 mins $Therapeutic Exercise: 8-22 mins        D. Royetta Asal PT, DPT 12/02/20, 12:14 PM

## 2020-12-02 NOTE — Evaluation (Addendum)
Occupational Therapy Re-Evaluation Patient Details Name: Douglas Lyons MRN: [id number] DOB: October 23, 1945 Today's Date: 12/02/2020    History of Present Illness Pt is a 75 yo M who comes to [facility] on 11/26/20 with over 3 weeks of worsening numbness in his hands bilaterally, left greater than right.  He additionally states he is had some difficulty with balance and UE weakness. 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 PMH includes DM and HTN.   Clinical Impression   Pt seen for OT re-evaluation this date, now POD#1 from above cervical surgery. Pt received in bed with Aspen collar in place. Prior to hospital admission, pt was independent with mobility, ADL, and IADL. Pt lives with spouse in a single family home with 3 steps to enter and no handrails with spouse able to provide 24/7 assist/support as needed for pt. Currently pt requires CGA for ADL transfers, and PRN Min a for LB and UB ADL tasks. Pt endorsing sensation returned to BUE with equal strength and coordination (mild increased time needed for thumb opposition but functionally not limited except buttoning). Pt educated in cervical precautions, Aspen collar mgt, self care skills, AE/DME, and home/routines modifications to maximize safety and functional independence while minimizing falls risk and maintaining precautions. Pt verbalized understanding of all education/training provided. Handout provided to support recall and carry over of learned precautions/techniques for bed mobility, functional transfers, and self care skills. Pt will benefit from skilled OT services while hospitalized to maximize return to PLOF. OT goals reviewed and updated to reflect progress. Upon hospital discharge, pt safe to discharge home. Do not anticipate additional skilled OT needs.     Follow Up Recommendations  No OT follow up;Other (comment) (supervision for mobility and tub transfers)     Equipment Recommendations  Tub/shower seat    Recommendations for Other Services       Precautions / Restrictions Precautions Precautions: Fall;Cervical Precaution Booklet Issued: Yes (comment) Required Braces or Orthoses: Cervical Brace Cervical Brace: Hard collar Restrictions Weight Bearing Restrictions: No Other Position/Activity Restrictions: Aspen cervical collar when OOB; elevate HOB      Mobility Bed Mobility Overal bed mobility: Needs Assistance Bed Mobility: Supine to Sit;Sit to Supine     Supine to sit: Supervision Sit to supine: Supervision   General bed mobility comments: cues for strategies to improve comfort and maintain cervical alignment    Transfers Overall transfer level: Needs assistance Equipment used: None Transfers: Lateral/Scoot Transfers          Lateral/Scoot Transfers: Supervision General transfer comment: Pt demo's good control and stability with lateral scoots EOB, would benefit from 2WW for full upright standing    Balance Overall balance assessment: Needs assistance Sitting-balance support: No upper extremity supported;Feet supported Sitting balance-Leahy Scale: Good                                     ADL either performed or assessed with clinical judgement   ADL Overall ADL's : Needs assistance/impaired Eating/Feeding: Modified independent Eating/Feeding Details (indicate cue type and reason): no difficulty managing utentils this date Grooming: Set up;Standing;Min guard   Upper Body Bathing: Minimal assistance   Lower Body Bathing: Minimal assistance   Upper Body Dressing : Minimal assistance Upper Body Dressing Details (indicate cue type and reason): Min A for buttoning but spouse able to assist Lower Body Dressing: Supervision/safety;Set  up Lower Body Dressing Details (indicate cue type and reason): Min A for buttoning/zippers but spouse able to assist Toilet Transfer: RW;Ambulation;Min Tax inspector Details (indicate cue type and reason): BSC over toilet to mimic handicap height toilet at home         Functional mobility during ADLs: Min guard;Rolling walker       Vision Baseline Vision/History: Wears glasses Wears Glasses: At all times Patient Visual Report: No change from baseline       Perception     Praxis      Pertinent Vitals/Pain Pain Assessment: No/denies pain     Hand Dominance Right   Extremity/Trunk Assessment Upper Extremity Assessment Upper Extremity Assessment: RUE deficits/detail;LUE deficits/detail RUE Deficits / Details: strength WFL, light touch intact, slow with thumb opposition but functionally able to manage utentils, packets/containers, etc. Moderate difficulty with buttoning (spouse helps with) LUE Deficits / Details: strength WFL, light touch intact, slow with thumb opposition but functionally able to manage utentils, packets/containers, etc. Moderate difficulty with buttoning (spouse helps with)   Lower Extremity Assessment Lower Extremity Assessment: Generalized weakness   Cervical / Trunk Assessment Cervical / Trunk Assessment: Normal   Communication Communication Communication: No difficulties   Cognition Arousal/Alertness: Awake/alert Behavior During Therapy: WFL for tasks assessed/performed Overall Cognitive Status: Within Functional Limits for tasks assessed                                     General Comments       Exercises Other Exercises Other Exercises: Pt instructed in cervical precautions and how to maintain during bed mobility and ADL tasks, falls prevention,  collar mgt, and home/routines modifications and AE/DME; handout provided   Shoulder Instructions      Home Living Family/patient expects to be discharged to:: Private residence Living Arrangements: Spouse/significant other Available Help at Discharge: Family;Available 24 hours/day;Friend(s) Type of Home: House Home  Access: Stairs to enter [employer] of Steps: 3 Entrance Stairs-Rails: Right;Left;Can reach both Home Layout: One level     Bathroom Shower/Tub: Teacher, early years/pre: Handicapped height     Home Equipment: Environmental consultant - 2 wheels;Walker - 4 wheels;Hand held shower head;Adaptive equipment Adaptive Equipment: Reacher Additional Comments: brother has a rollator for patient to borrow      Prior Functioning/Environment Level of Independence: Independent        Comments: Ind amb community distances without an AD, Ind with ADLs, 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, retired Airline pilot, 2 falls in the last 6 months        OT Problem List: Decreased strength;Decreased range of motion;Decreased activity tolerance;Impaired balance (sitting and/or standing);Decreased knowledge of use of DME or AE;Decreased knowledge of precautions      OT Treatment/Interventions: Self-care/ADL training;Therapeutic exercise;Patient/family education;Balance training;Therapeutic activities;DME and/or AE instruction    OT Goals(Current goals can be found in the care plan section) Acute Rehab OT Goals Patient Stated Goal: To be active again and get back to work OT Goal Formulation: With patient Time For Goal Achievement: 12/16/20 Potential to Achieve Goals: Good ADL Goals Pt Will Perform Upper Body Dressing: with modified independence;sitting Pt Will Transfer to Toilet: with supervision;ambulating (elevated commode, LRAD for amb, maintaining cervical precautions)  OT Frequency: Min 1X/week   Barriers to D/C:            Co-evaluation  AM-PAC OT "6 Clicks" Daily Activity     Outcome Measure Help from another person eating meals?: None Help from another person taking care of personal grooming?: None Help from another person toileting, which includes using toliet, bedpan, or urinal?: A Little Help from another person bathing (including washing, rinsing,  drying)?: A Little Help from another person to put on and taking off regular upper body clothing?: A Little Help from another person to put on and taking off regular lower body clothing?: A Little 6 Click Score: 20   End of Session    Activity Tolerance: Patient tolerated treatment well Patient left: in bed;with call bell/phone within reach;with bed alarm set;Other (comment);with SCD's reapplied (aspen collar in place)  OT Visit Diagnosis: Other abnormalities of gait and mobility (R26.89)                Time: 9977-4142 OT Time Calculation (min): 20 min Charges:  OT General Charges $OT Visit: 1 Visit OT Evaluation $OT Re-eval: 1 Re-eval OT Treatments $Self Care/Home Management : 8-22 mins  Hanley Hays, MPH, MS, OTR/L ascom [phone] 12/02/20, 4:09 PM

## 2020-12-03 LAB — BASIC METABOLIC PANEL
Anion gap: 10 (ref 5–15)
BUN: 13 mg/dL (ref 8–23)
CO2: 27 mmol/L (ref 22–32)
Calcium: 9.1 mg/dL (ref 8.9–10.3)
Chloride: 98 mmol/L (ref 98–111)
Creatinine, Ser: 0.88 mg/dL (ref 0.61–1.24)
GFR, Estimated: >60 mL/min (ref >60)
Glucose, Bld: 198 mg/dL — ABNORMAL HIGH (ref 70–99)
Potassium: 4.3 mmol/L (ref 3.5–5.1)
Sodium: 135 mmol/L (ref 135–145)

## 2020-12-03 LAB — GLUCOSE, CAPILLARY
Glucose-Capillary: 193 mg/dL — ABNORMAL HIGH (ref 70–99)
Glucose-Capillary: 223 mg/dL — ABNORMAL HIGH (ref 70–99)
Glucose-Capillary: 200 mg/dL — ABNORMAL HIGH (ref 70–99)

## 2020-12-03 MED ORDER — SENNOSIDES-DOCUSATE SODIUM 8.6-50 MG PO TABS: 1.0000 | ORAL_TABLET | Freq: Two times a day (BID) | ORAL | 0 refills | Status: DC

## 2020-12-03 MED ORDER — DEXAMETHASONE SODIUM PHOSPHATE 4 MG/ML IJ SOLN
2.0000 mg | Freq: Four times a day (QID) | INTRAMUSCULAR | Status: DC
  Administered 2020-12-03 – 2020-12-04 (×5): 2 mg via INTRAVENOUS
  Filled 2020-12-03 (×8): qty 0.5

## 2020-12-03 MED ORDER — HYDROCODONE-ACETAMINOPHEN 5-325 MG PO TABS: 1.0000 | ORAL_TABLET | ORAL | 0 refills | Status: DC | PRN

## 2020-12-03 NOTE — Discharge Instructions (Addendum)
NEUROSURGERY DISCHARGE INSTRUCTIONS  Admission diagnosis: Paresthesias [R20.2] Cervical spinal stenosis [M48.02] Weakness [R53.1] Gait instability [R26.81]  Operative procedure: C3-5 ACDF  What to do after you leave the hospital:  Recommended diet: regular diet. Increase protein intake to promote wound healing.  Recommended activity: No heavy lifting or strenuous activity for 6 weeks. You should walk multiple times per day  Special Instructions  No straining, no heavy lifting > 10lbs x 6 weeks.  Keep incision area clean and dry. May shower now. No baths or pools for 6 weeks.  Please remove dressing, no need to reapply one. Continue cervical collar until follow up in clinic  You have no sutures to remove, the skin is closed with adhesive  Please take pain medications as directed. Take a stool softener if on pain medications  You may restart home medications but hold aspirin until April 15th   Please Report any of the following: Nausea or Vomiting, Temperature is greater than 101.71F (38.1C) degrees, Dizziness, Abdominal Pain, Difficulty Breathing or Shortness of Breath, Inability to Eat, drink Fluids, or Take medications, Bleeding, swelling, or drainage from surgical incision sites, New numbness or weakness, and Bowel or bladder dysfunction to the neurosurgeon on call at [phone]  Additional Follow up appointments Please follow up with Dr Lacinda Axon in [place] [facility] as scheduled in 2-3 weeks   Please see below for scheduled appointments:  No future appointments.

## 2020-12-03 NOTE — Progress Notes (Signed)
Physical Therapy Treatment Patient Details Name: Douglas Lyons MRN: [id number] DOB: 18-Jan-1946 Today's Date: 12/03/2020    History of Present Illness Pt is a 75 yo M who comes to [facility] on 11/26/20 with over 3 weeks of worsening numbness in his hands bilaterally, left greater than right.  He additionally states he is had some difficulty with balance and UE weakness. 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 PMH includes DM and HTN.    PT Comments    Pt in bed, ready for session.  To EOB with min a x 1 for upper body and balance.  Generally steady in sitting but close supervision for safety.  Attempted to stand with mod a x 1 and RW.  He is unable to stand fully despite assist and cues and after 30 seconds asks to sit back down.  He stated he is weak from lack of sleep and "I don't feel myself today."  He requires min a x 1 to return to supine.    Per chart and pt, plan is for DC home.  Pt did well on eval but since has shown progressive weakness and inability to fully stand for gait today.  Given mobility and assist level, pt may be more appropriate for SNF upon discharge.  Will re-visit again tomorrow but anticipate change in discharge disposition if significant improvement is not made tomorrow.    Messaged team and will place on evaluating PT's schedule for tomorrow.   Follow Up Recommendations  Home health PT;Other (comment)     Equipment Recommendations  None recommended by PT    Recommendations for Other Services       Precautions / Restrictions Precautions Precautions: Fall;Cervical Precaution Booklet Issued: Yes (comment) Required Braces or Orthoses: Cervical Brace Cervical Brace: Hard collar Restrictions Weight Bearing Restrictions: No Other Position/Activity Restrictions: Aspen cervical collar when OOB; elevate HOB    Mobility  Bed Mobility Overal bed mobility: Needs Assistance Bed  Mobility: Supine to Sit;Sit to Supine     Supine to sit: Min assist Sit to supine: Min assist   General bed mobility comments: log roll  with supervision + vcs for improve technique and min assist to elevate upper body/balance    Transfers Overall transfer level: Needs assistance Equipment used: Rolling walker (2 wheeled) Transfers: Sit to/from Stand Sit to Stand: Min assist;Mod assist         General transfer comment: unable to stand fully - remains flexed  Ambulation/Gait             General Gait Details: unable.  continues to state it is related to no sleep   Stairs             Wheelchair Mobility    Modified Rankin (Stroke Patients Only)       Balance Overall balance assessment: Needs assistance Sitting-balance support: No upper extremity supported;Feet supported Sitting balance-Leahy Scale: Fair Sitting balance - Comments: no balance deficis in sitting   Standing balance support: Bilateral upper extremity supported;During functional activity Standing balance-Leahy Scale: Poor Standing balance comment: uanble to stand fully despite cues.                            Cognition Arousal/Alertness: Awake/alert Behavior During Therapy: WFL for tasks assessed/performed Overall Cognitive Status: Within Functional Limits for tasks assessed  Exercises      General Comments        Pertinent Vitals/Pain Pain Assessment: Faces Faces Pain Scale: Hurts a little bit Pain Location: neck Pain Descriptors / Indicators: Contraction;Discomfort;Spasm Pain Intervention(s): Limited activity within patient's tolerance;Monitored during session;Repositioned    Home Living                      Prior Function            PT Goals (current goals can now be found in the care plan section) Progress towards PT goals: Not progressing toward goals - comment    Frequency    BID       PT Plan Current plan remains appropriate    Co-evaluation              AM-PAC PT "6 Clicks" Mobility   Outcome Measure  Help needed turning from your back to your side while in a flat bed without using bedrails?: None Help needed moving from lying on your back to sitting on the side of a flat bed without using bedrails?: None Help needed moving to and from a bed to a chair (including a wheelchair)?: A Little Help needed standing up from a chair using your arms (e.g., wheelchair or bedside chair)?: A Little Help needed to walk in hospital room?: A Lot Help needed climbing 3-5 steps with a railing? : A Lot 6 Click Score: 18    End of Session Equipment Utilized During Treatment: Gait belt;Cervical collar Activity Tolerance: Patient tolerated treatment well Patient left: in chair;with call bell/phone within reach;with chair alarm set;with family/visitor present Nurse Communication: Mobility status PT Visit Diagnosis: Unsteadiness on feet (R26.81);Difficulty in walking, not elsewhere classified (R26.2);Other abnormalities of gait and mobility (R26.89);Muscle weakness (generalized) (M62.81);Ataxic gait (R26.0);Other symptoms and signs involving the nervous system (R29.898);History of falling (Z91.81)     Time: 1219-7588 PT Time Calculation (min) (ACUTE ONLY): 10 min  Charges:  $Therapeutic Activity: 8-22 mins                    Chesley Noon, PTA 12/03/20, 12:00 PM

## 2020-12-03 NOTE — Progress Notes (Signed)
No acute events overnight, C-collar remain in place overnight. Pain controlled with oral anlgesic. Vital signs stable. Dressing to anterior neck clean and intact. Condition stable.

## 2020-12-04 ENCOUNTER — Encounter: Payer: Self-pay | Admitting: Neurosurgery

## 2020-12-04 LAB — GLUCOSE, CAPILLARY
Glucose-Capillary: 206 mg/dL — ABNORMAL HIGH (ref 70–99)
Glucose-Capillary: 246 mg/dL — ABNORMAL HIGH (ref 70–99)
Glucose-Capillary: 282 mg/dL — ABNORMAL HIGH (ref 70–99)

## 2020-12-04 MED ORDER — MUPIROCIN 2 % EX OINT
1.0000 "application " | TOPICAL_OINTMENT | Freq: Two times a day (BID) | CUTANEOUS | Status: DC
  Filled 2020-12-04: qty 22

## 2020-12-04 MED ORDER — CHLORHEXIDINE GLUCONATE CLOTH 2 % EX PADS: 6.0000 | MEDICATED_PAD | Freq: Every day | CUTANEOUS | Status: DC

## 2020-12-04 NOTE — Discharge Summary (Signed)
Physician Discharge Summary  Patient ID: Douglas Lyons MRN: [id number] DOB/AGE: 09-11-1945 75 y.o.  Admit date: 11/26/2020 Discharge date: 12/04/2020  Admission Diagnoses:Principal Problem:   Stenosis of cervical spine with myelopathy [facility])  Discharge Diagnoses:  Principal Problem:   Stenosis of cervical spine with myelopathy (HCC) Active Problems:   Gait instability   Diabetes mellitus, type 2 (HCC)   Hypertension   GERD (gastroesophageal reflux disease)   Discharged Condition: good  Hospital Course:  Douglas Lyons presented to the emergency department on 4/3 with ongoing difficulty with walking and weakness.  MRI revealed severe stenosis at C3-4 and C4-5 with spinal cord deformation.  Given his new myelopathy and the severity of the imaging, surgical decompression of fusion was discussed.  He did want to proceed and we did admit him for preoperative medical stabilization and physical therapy evaluation.  He did well without any episodes of hypotension and his strength was stable throughout the week.  On 4/8, he was taken for the surgery which consist of a C3-5 ACDF.  There were no complications.  He was taken to the floor postop leaflet care.  On postop day 1, he was tolerating a liquid diet with some mild swallowing difficulty but started working with physical therapy.  His strength and sensation had improved.  His gait had improved.  He continues to do well with this and was given some steroids for mild swallowing difficulty.  By 4/11, he was cleared for home by physical therapy.  His incision was soft.  His x-rays were completed.  He was voiding urine.  He met all criteria for discharge home and he was discharged on 12/04/2020.  Consults: None   Discharge Exam: Blood pressure (!) 154/92, pulse 89, temperature 99 F (37.2 C), resp. rate 16, height _0  (1.778 m), weight 95.7 kg, SpO2 98 %. General appearance: Alert, cooperative, in no acute distress Oropharynx: Moist without  lesions Neck: Supple, Cervical collar on, incision flat and soft   Neurologic exam:  Mental status: alertness: alert,  affect: normal Speech: fluent and clear Motor:strength symmetric 5/5 in bilateral upper and lower extremities Sensory: Decreased light touch in bilateral hands as well as the forearm and distal arm on the left but improved,  intact in lower extremities    Disposition:  Home  Discharge Instructions     Incentive spirometry RT   Complete by: As directed       Allergies as of 12/04/2020   No Known Allergies      Medication List     TAKE these medications    acetaminophen 650 MG CR tablet Commonly known as: TYLENOL Take 650 mg by mouth in the morning and at bedtime.   aspirin EC 81 MG tablet Take 81 mg by mouth daily.   cholecalciferol 25 MCG (1000 UNIT) tablet Commonly known as: VITAMIN D3 Take 1,000 Units by mouth daily.   enalapril 5 MG tablet Commonly known as: VASOTEC Take 5 mg by mouth daily.   glipiZIDE-metformin 2.5-500 MG tablet Commonly known as: METAGLIP Take 1 tablet by mouth 2 (two) times daily before a meal.   HYDROcodone-acetaminophen 5-325 MG tablet Commonly known as: NORCO/VICODIN Take 1-2 tablets by mouth every 4 (four) hours as needed for moderate pain.   rosuvastatin 20 MG tablet Commonly known as: CRESTOR Take 20 mg by mouth daily.   senna-docusate 8.6-50 MG tablet Commonly known as: Senokot-S Take 1 tablet by mouth 2 (two) times daily.  Signed: Deetta Perla 12/04/2020, 9:20 AM

## 2020-12-04 NOTE — Progress Notes (Signed)
Identifying Statement: ARCANGEL MINION is a 75 y.o. male from [place] [id number] with numbness and balance difficulty  Physician Requesting Consultation: [place] [facility]  Daily events: 12/03/20: Feeling strong and PT recommended home discharge. Some difficulty with swallowing solid food but handling liquids. Pain well controlled 12/02/2020: Working with PT.  Tolerated clears.  Still with some difficulty swallowing, but able to take pills. Could ambulate with PT today. 12/01/2020: POD0 for ACDF C3-5 11/30/2020: Working with physical therapy. Medically optimized.  11/29/2020: No new events.   History of Present Illness: Mr. Lard presents with over 3 weeks of worsening numbness in his hands bilaterally, left greater than right.  He additionally states he is had some difficulty with balance.  He is right-handed but has noted some decrease in strength over the left hand and arm.  The numbness does appear to travel up the arm towards the shoulder.  On the right side, he remains in the hand.  Overall, he does feel like this is worsening.  He does not report any inciting event and denies any falls or traumatic incidents prior to the onset.  He does not report any significant neck pain.  He did get a MRI and CT scan of the cervical spine in the emergency department.  Past Medical History:      Past Medical History:  Diagnosis Date  . Cataracts, bilateral   . Degenerative joint disease of knee, left   . Diabetes mellitus, type 2 ([facility])   . Diverticulosis   . Erectile dysfunction   . GERD (gastroesophageal reflux disease)   . Heme positive stool   . Hyperlipidemia   . Hypertension   . Lumbar disc herniation   . Overweight (BMI 25.0-29.9)     Social History: Social History        Socioeconomic History  . Marital status: Married    Spouse name: Not on file  . Number of children: Not on file  . Years of education: Not on file  . Highest education  level: Not on file  Occupational History  . Not on file  Tobacco Use  . Smoking status: Never Smoker  . Smokeless tobacco: Never Used  Vaping Use  . Vaping Use: Never used  Substance and Sexual Activity  . Alcohol use: Yes    Comment: OCCASIONAL   . Drug use: Not Currently  . Sexual activity: Not on file  Other Topics Concern  . Not on file  Social History Narrative  . Not on file   Social Determinants of Health   Financial Resource Strain: Not on file  Food Insecurity: Not on file  Transportation Needs: Not on file  Physical Activity: Not on file  Stress: Not on file  Social Connections: Not on file  Intimate Partner Violence: Not on file    Family History: History reviewed. No pertinent family history.  Review of Systems:  Review of Systems - General ROS: Negative Psychological ROS: Negative Ophthalmic ROS: Negative ENT ROS: Negative Hematological and Lymphatic ROS: Negative  Endocrine ROS: Negative Respiratory ROS: Negative Cardiovascular ROS: Negative Gastrointestinal ROS: Negative Genito-Urinary ROS: Negative Musculoskeletal ROS: Negative for neck pain Neurological ROS: Positive for weakness, numbness Dermatological ROS: Negative  Physical Exam: Vitals:   12/04/20 0356 12/04/20 0815  BP: (!) 152/81 (!) 154/92  Pulse: 73 89  Resp: 16 16  Temp: 98.3 F (36.8 C) 99 F (37.2 C)  SpO2: 94% 98%      General appearance: Alert, cooperative, in no acute  distress Oropharynx: Moist without lesions Neck: Supple, Cervical collar on, incision flat and soft  Neurologic exam:  Mental status: alertness: alert,  affect: normal Speech: fluent and clear Motor:strength symmetric 5/5 in bilateral upper and lower extremities Sensory: Decreased light touch in bilateral hands as well as the forearm and distal arm on the left but improved,  intact in lower extremities   Imaging: MRI cervical spine:Degenerative disc osteophyte at C3-4 with resultant  severe spinal stenosis and flattening of the cervical spinal cord. 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 Additional degenerative spondylosis at C4-5 and C5-6 with resultant moderate spinal stenosis.  Multifactorial degenerative changes with resultant multilevel foraminal narrowing as above. Notable findings include moderate left C3 foraminal stenosis, with severe bilateral C4, C5, C6, and left C7 foraminal narrowing.  CT Cervical Spine: No acute fracture, malalignment or bony lesion. 2. Multilevel cervical spondylosis and right worse than left facet arthropathy. Please see MRI of the cervical spine obtained earlier today for further detail of underlying neural impingement.  Postop xrays - good position of implants   Impression/Plan:  Mr. Rafter is here with cervical myelopathy, now POD1 from ACDF C3-5.  He is improving.  1.  Diagnosis: Cervical myelopathy  2.  Plan - Continue PT - continue [place] J collar when OOB - Advance to soft diet - Decadron today for edema and swallowing - Dispo planning  Deetta Perla MD Neurosurgery

## 2020-12-04 NOTE — TOC Progression Note (Signed)
Transition of Care [facility]) - Progression Note    Patient Details  Name: Douglas Lyons MRN: [id number] Date of Birth: 10-15-1945  [facility] CM/SW [place], RN Phone Number: 12/04/2020, 10:46 AM  Clinical Narrative:   Spoke with the patient and his wife in [place] room at the bedside, the patient will need a 3 in 1 and a RW, Notified [employer], it will be delivered to the room, he wants to go to [place] [facility] for outpatient pT and said it is set up already, he does not want to do [place] at all.  No additional needs   Expected Discharge Plan: [place] Barriers to Discharge: Continued Medical Work up  Expected Discharge Plan and Services Expected Discharge Plan: [place]   Discharge Planning Services: CM Consult   Living arrangements for the past 2 months: Single Family Home                 DME Arranged: N/A                     Social Determinants of Health (SDOH) Interventions    Readmission Risk Interventions No flowsheet data found.

## 2020-12-04 NOTE — Progress Notes (Signed)
Physical Therapy Treatment Patient Details Name: Douglas Lyons MRN: [id number] DOB: 04/11/1946 Today's Date: 12/04/2020    History of Present Illness Pt is a 75 yo M who comes to [facility] on 11/26/20 with over 3 weeks of worsening numbness in his hands bilaterally, left greater than right.  He additionally states he is had some difficulty with balance and UE weakness. 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 PMH includes DM and HTN.    PT Comments    Pt was pleasant and motivated to participate during the session and overall performed well.  Pt required no physical assistance with any functional task and was steady with ambulation and stair training with occasional cues for general safety and sequencing.  Pt reported only mild cervical pain and his SpO2 and HR were both WNL during the session.  Pt will benefit from continued PT services upon discharge per surgeon's recommendation to safely address deficits listed in patient problem list for decreased caregiver assistance and eventual return to PLOF.     Follow Up Recommendations  Follow surgeon's recommendation for DC plan and follow-up therapies     Equipment Recommendations  Rolling walker with 5" wheels;3in1 (PT)    Recommendations for Other Services       Precautions / Restrictions Precautions Precautions: Fall;Cervical Required Braces or Orthoses: Cervical Brace Cervical Brace: Hard collar Restrictions Other Position/Activity Restrictions: Aspen cervical collar when OOB; elevate HOB    Mobility  Bed Mobility               General bed mobility comments: NT, pt in recliner    Transfers Overall transfer level: Needs assistance Equipment used: Rolling walker (2 wheeled) Transfers: Sit to/from Stand Sit to Stand: Min guard         General transfer comment: Min verbal cues for hand placement with good eccentric and concentric control  and stability  Ambulation/Gait Ambulation/Gait assistance: Supervision;Min guard Gait Distance (Feet): 125 Feet Assistive device: Rolling walker (2 wheeled) Gait Pattern/deviations: Step-through pattern;Trunk flexed;Decreased step length - right;Decreased step length - left Gait velocity: decreased   General Gait Details: Slow cadence but steady without LOB with min verbal cues for amb closer to the RW with upright posture and inside the RW during sharp turns   Stairs Stairs: Yes Stairs assistance: Min guard Stair Management: Two rails;Step to pattern;Forwards Number of Stairs: 4 General stair comments: Pt self-reported that his RLE is his stronger leg and that his L knee needs replaced.  Education provided for leading with R leg ascending and L leg descending steps. Pt able to ascend/descend 4 steps with improved eccentric and concentric control using step-to pattern this session with education/cuing provided for proper sequencing.   Wheelchair Mobility    Modified Rankin (Stroke Patients Only)       Balance Overall balance assessment: Needs assistance Sitting-balance support: No upper extremity supported;Feet supported Sitting balance-Leahy Scale: Good     Standing balance support: Bilateral upper extremity supported;During functional activity Standing balance-Leahy Scale: Fair Standing balance comment: Min to mod lean on the RW for support but pt steady during the session with no assist required to prevent LOB                            Cognition Arousal/Alertness: Awake/alert Behavior During Therapy: WFL for tasks assessed/performed Overall Cognitive Status: Within Functional Limits for tasks assessed  Exercises Total Joint Exercises Hip ABduction/ADduction: Strengthening;Both;10 reps;Seated Long Arc Quad: 10 reps;Both;Strengthening (with manual resistance) Knee Flexion: Strengthening;Both;10 reps  (with manual resistance) Other Exercises Other Exercises: Cervical precaution review Other Exercises: Car transfer sequencing education provided with pt and spouse    General Comments        Pertinent Vitals/Pain Pain Assessment: 0-10 Pain Score: 2  Pain Location: neck Pain Descriptors / Indicators: Sore Pain Intervention(s): Monitored during session;Premedicated before session    Home Living                      Prior Function            PT Goals (current goals can now be found in the care plan section) Progress towards PT goals: Progressing toward goals    Frequency    BID      PT Plan Current plan remains appropriate    Co-evaluation              AM-PAC PT "6 Clicks" Mobility   Outcome Measure  Help needed turning from your back to your side while in a flat bed without using bedrails?: None Help needed moving from lying on your back to sitting on the side of a flat bed without using bedrails?: None Help needed moving to and from a bed to a chair (including a wheelchair)?: A Little Help needed standing up from a chair using your arms (e.g., wheelchair or bedside chair)?: A Little Help needed to walk in hospital room?: A Little Help needed climbing 3-5 steps with a railing? : A Little 6 Click Score: 20    End of Session Equipment Utilized During Treatment: Gait belt;Cervical collar Activity Tolerance: Patient tolerated treatment well Patient left: in chair;with call bell/phone within reach;with chair alarm set;with family/visitor present;with SCD's reapplied Nurse Communication: Mobility status PT Visit Diagnosis: Unsteadiness on feet (R26.81);Difficulty in walking, not elsewhere classified (R26.2);Other abnormalities of gait and mobility (R26.89);Muscle weakness (generalized) (M62.81);Ataxic gait (R26.0);Other symptoms and signs involving the nervous system (R29.898);History of falling (Z91.81)     Time: 3785-8850 PT Time Calculation (min)  (ACUTE ONLY): 29 min  Charges:  $Gait Training: 8-22 mins $Therapeutic Activity: 8-22 mins                     D. Scott Dimas Scheck PT, DPT 12/04/20, 11:47 AM

## 2020-12-04 NOTE — Plan of Care (Signed)

## 2020-12-04 NOTE — Care Management Important Message (Signed)
Important Message  Patient Details  Name: Douglas Lyons MRN: [id number] Date of Birth: 10/09/1945   Medicare Important Message Given:  Yes     Dannette Barbara 12/04/2020, 11:37 AM

## 2020-12-04 NOTE — Progress Notes (Signed)
Occupational Therapy Treatment Patient Details Name: Douglas Lyons MRN: [id number] DOB: Nov 05, 1945 Today's Date: 12/04/2020    History of present illness Pt is a 75 yo M who comes to [facility] on 11/26/20 with over 3 weeks of worsening numbness in his hands bilaterally, left greater than right.  He additionally states he is had some difficulty with balance and UE weakness. 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 PMH includes DM and HTN.   OT comments  Pt seen for OT tx this date. Pt endorses feeling much better this date. Spouse present. Pt/spouse instructed in home/routines modifications for LB dressing and bathing, positioning to maintain cervical precautions, and falls prevention. Pt and spouse verbalized understanding. Appreciative of additional instruction. Pt progressing towards goals, continues to benefit from skilled OT services while hospitalized. Do not anticipate skilled OT needs upon discharge.    Follow Up Recommendations  No OT follow up;Other (comment) (sup for bathing, ADL mobility)    Equipment Recommendations  Tub/shower seat    Recommendations for Other Services      Precautions / Restrictions Precautions Precautions: Fall;Cervical Required Braces or Orthoses: Cervical Brace Cervical Brace: Hard collar Restrictions Weight Bearing Restrictions: No Other Position/Activity Restrictions: Aspen cervical collar when OOB; elevate HOB       Mobility Bed Mobility               General bed mobility comments: NT, pt in recliner    Transfers         General transfer comment: Min verbal cues for hand placement with good eccentric and concentric control and stability    Balance Overall balance assessment: Needs assistance Sitting-balance support: No upper extremity supported;Feet supported Sitting balance-Leahy Scale: Good                               ADL either  performed or assessed with clinical judgement   ADL Overall ADL's : Needs assistance/impaired                     Lower Body Dressing: Minimal assistance;Sit to/from stand Lower Body Dressing Details (indicate cue type and reason): MIN A for donning shoes w/ side zippers                     Vision Baseline Vision/History: Wears glasses Wears Glasses: At all times Patient Visual Report: No change from baseline     Perception     Praxis      Cognition Arousal/Alertness: Awake/alert Behavior During Therapy: WFL for tasks assessed/performed Overall Cognitive Status: Within Functional Limits for tasks assessed                                          Exercises  Other Exercises: Pt/spouse instructed in home/routines modifications for LB dressing, positioning to maintain cervical precautions, falls prevention   Shoulder Instructions       General Comments      Pertinent Vitals/ Pain       Pain Assessment: Faces Pain Score: 2  Faces Pain Scale: Hurts a little bit Pain Location: neck Pain Descriptors / Indicators: Sore Pain Intervention(s): Limited activity within patient's tolerance;Monitored during session;Premedicated before session  Home Living  Prior Functioning/Environment              Frequency  Min 1X/week        Progress Toward Goals  OT Goals(current goals can now be found in the care plan section)  Progress towards OT goals: Progressing toward goals  Acute Rehab OT Goals Patient Stated Goal: go home OT Goal Formulation: With patient Time For Goal Achievement: 12/16/20 Potential to Achieve Goals: Good  Plan Discharge plan remains appropriate;Frequency remains appropriate    Co-evaluation                 AM-PAC OT "6 Clicks" Daily Activity     Outcome Measure   Help from another person eating meals?: None Help from another person taking care  of personal grooming?: None Help from another person toileting, which includes using toliet, bedpan, or urinal?: A Little Help from another person bathing (including washing, rinsing, drying)?: A Little Help from another person to put on and taking off regular upper body clothing?: A Little Help from another person to put on and taking off regular lower body clothing?: A Little 6 Click Score: 20    End of Session    OT Visit Diagnosis: Other abnormalities of gait and mobility (R26.89)   Activity Tolerance Patient tolerated treatment well   Patient Left in chair;with call bell/phone within reach;with chair alarm set;with SCD's reapplied;with family/visitor present;Other (comment) (collar in place)   Nurse Communication          Time: 2426-8341 OT Time Calculation (min): 39 min  Charges: OT General Charges $OT Visit: 1 Visit OT Treatments $Self Care/Home Management : 38-52 mins  Hanley Hays, MPH, MS, OTR/L ascom [phone] 12/04/20, 1:12 PM

## 2020-12-04 NOTE — Progress Notes (Signed)
No acute events overnight  Condition remains stable. Pain is control on oral analgesics. Presently sitting out, safety measures in place.

## 2020-12-11 ENCOUNTER — Encounter: Payer: Self-pay | Admitting: Neurosurgery

## 2020-12-19 ENCOUNTER — Other Ambulatory Visit: Payer: Self-pay | Admitting: Neurosurgery

## 2020-12-19 ENCOUNTER — Other Ambulatory Visit: Payer: Self-pay

## 2020-12-19 ENCOUNTER — Ambulatory Visit
Admission: RE | Admit: 2020-12-19 | Discharge: 2020-12-19 | Disposition: A | Discharge status: Home / Self Care | Payer: Medicare Other | Source: Ambulatory Visit | Attending: Neurosurgery | Admitting: Neurosurgery

## 2020-12-19 DIAGNOSIS — M79661 Pain in right lower leg: Secondary | ICD-10-CM | POA: Diagnosis not present

## 2020-12-19 DIAGNOSIS — M7989 Other specified soft tissue disorders: Secondary | ICD-10-CM | POA: Diagnosis present

## 2020-12-19 IMAGING — US US EXTREM LOW VENOUS*R*
1 series · 14 of 24 positions shown · non-contrast
Comparison: None.

CLINICAL DATA: Pain and swelling right lower extremity.

EXAM:
Right LOWER EXTREMITY VENOUS DOPPLER ULTRASOUND
TECHNIQUE: Gray-scale sonography with compression, as well as color and duplex
ultrasound, were performed to evaluate the deep venous system(s)
from the level of the common femoral vein through the popliteal and
proximal calf veins.

[Series 1: us venous img lower uni right (dvt) · portal-venous · 14 of 35 slices shown]
[im 1/35]
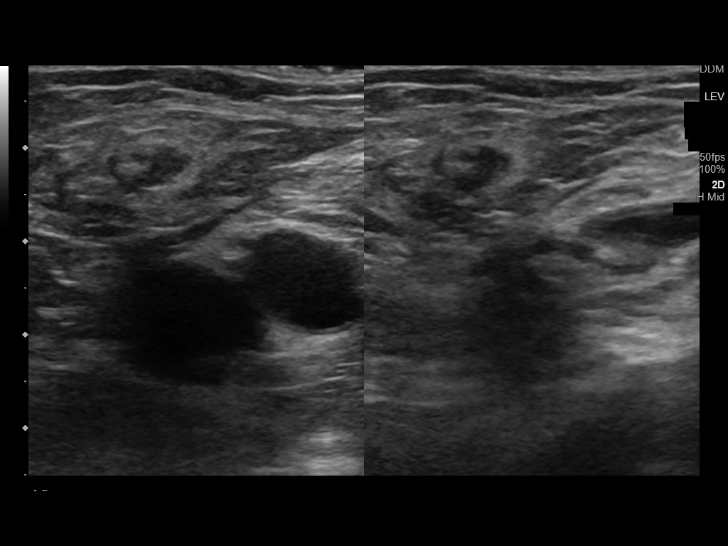
[im 3/35]
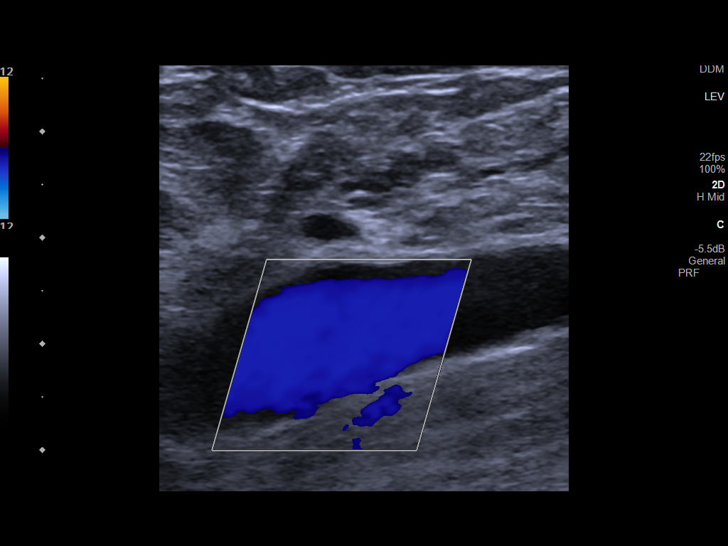
[im 6/35]
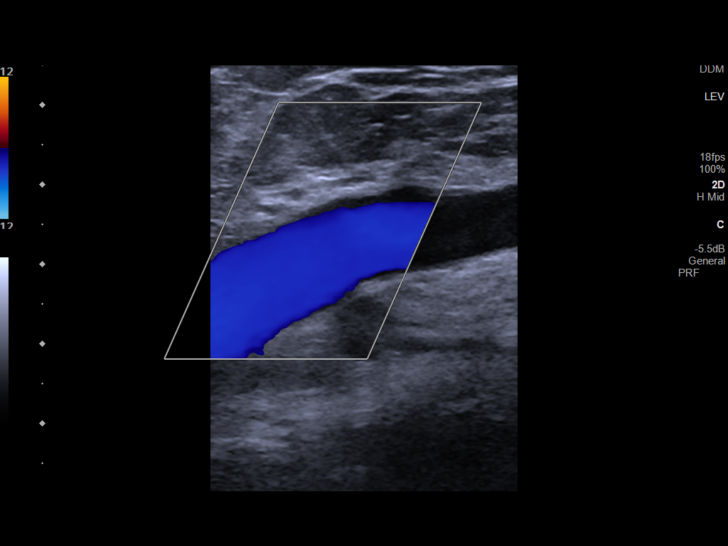
[im 9/35]
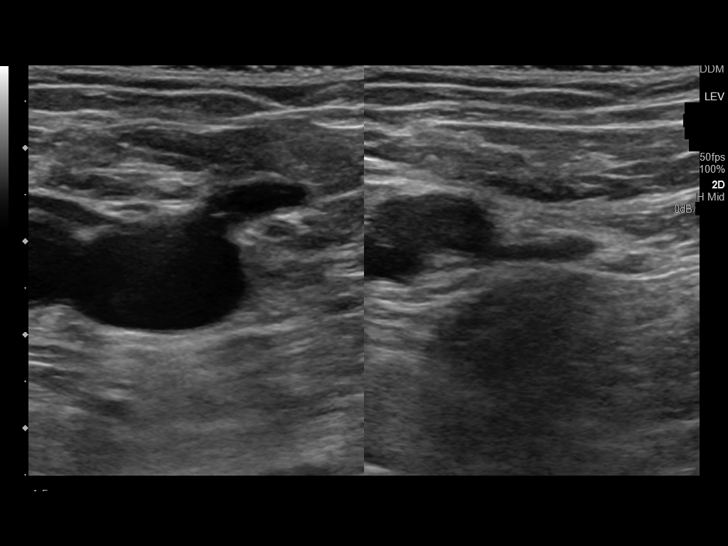
[im 11/35]
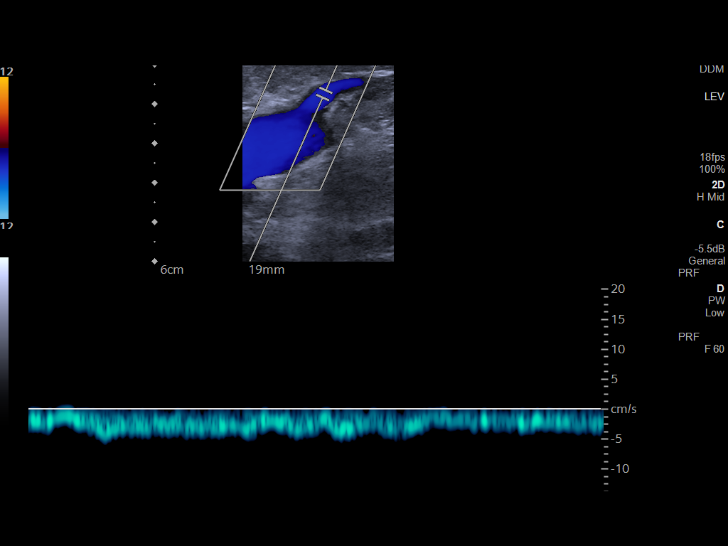
[im 14/35]
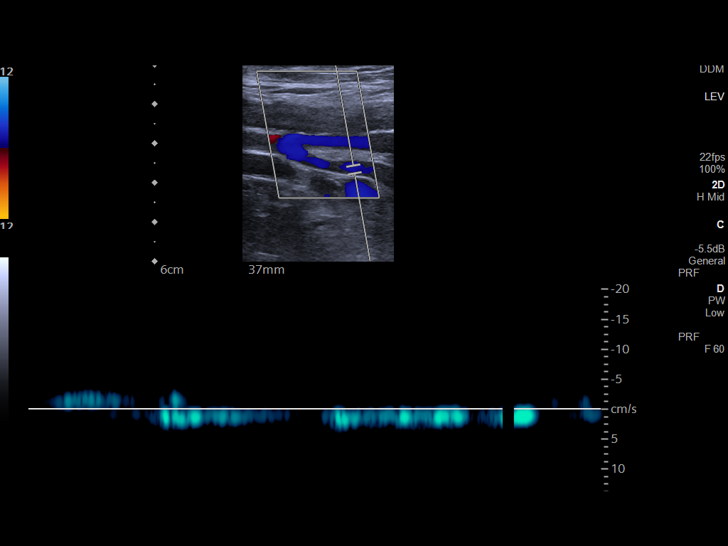
[im 17/35]
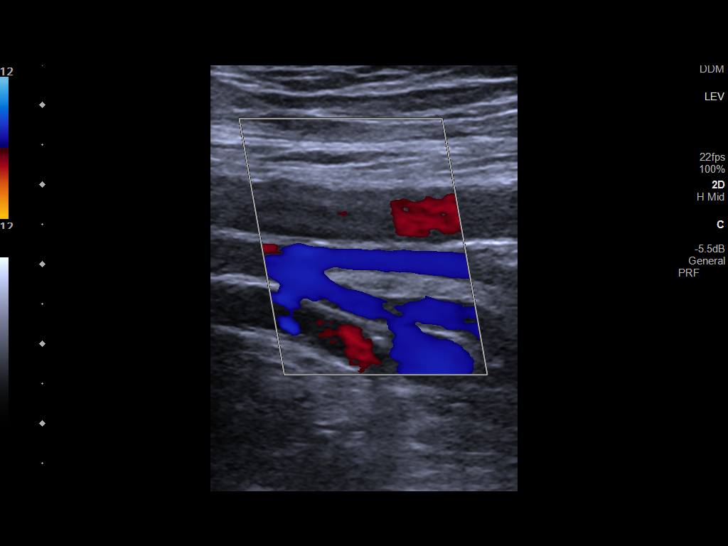
[im 18/35]
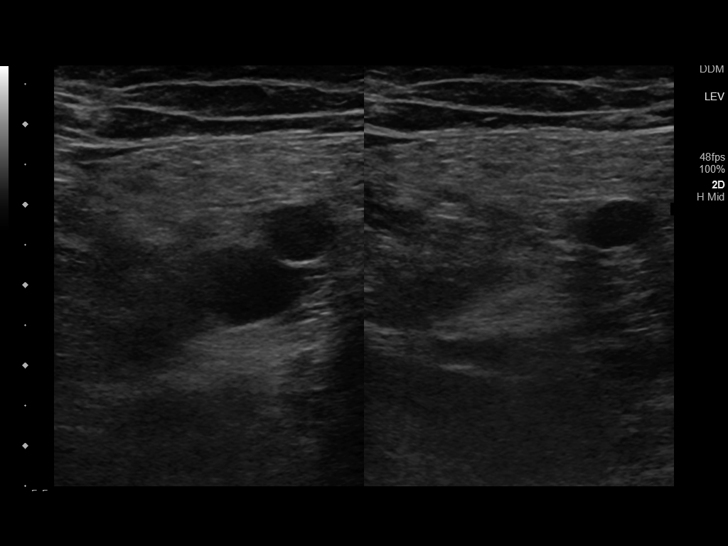
[im 21/35]
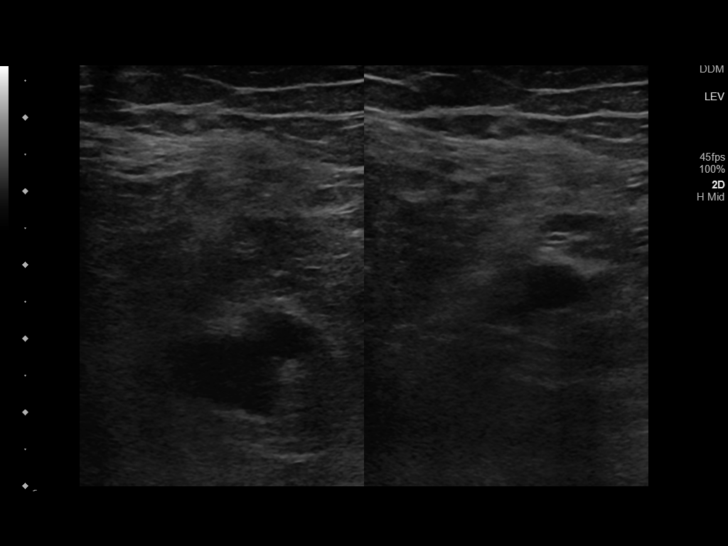
[im 24/35]
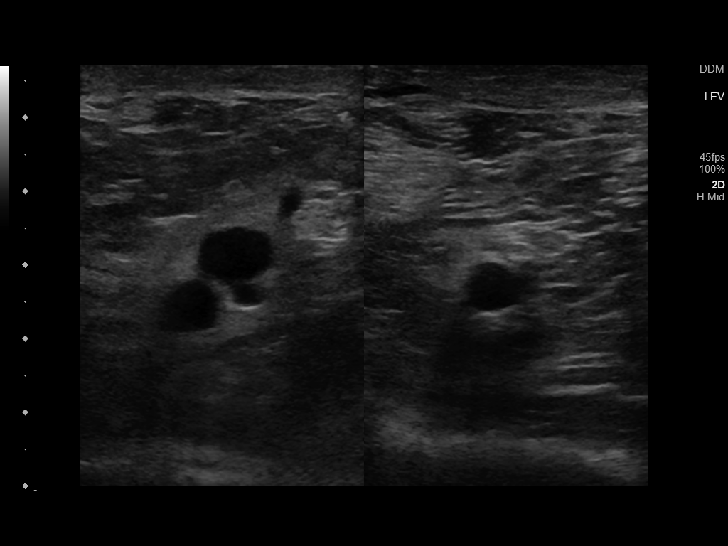
[im 27/35]
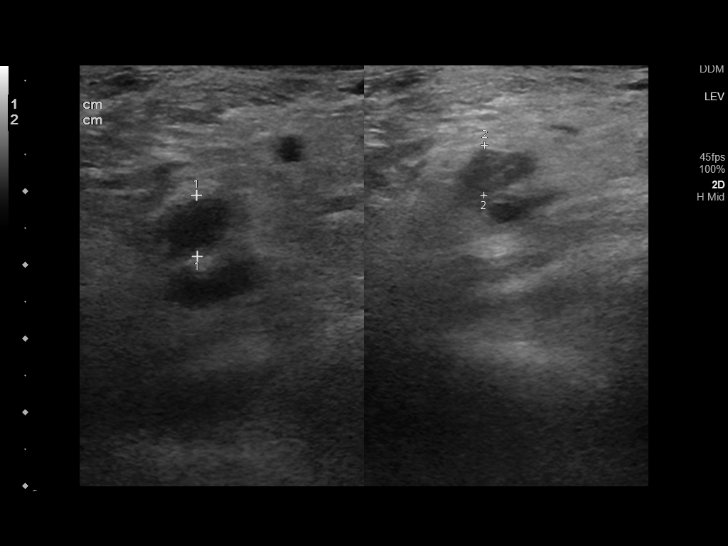
[im 29/35]
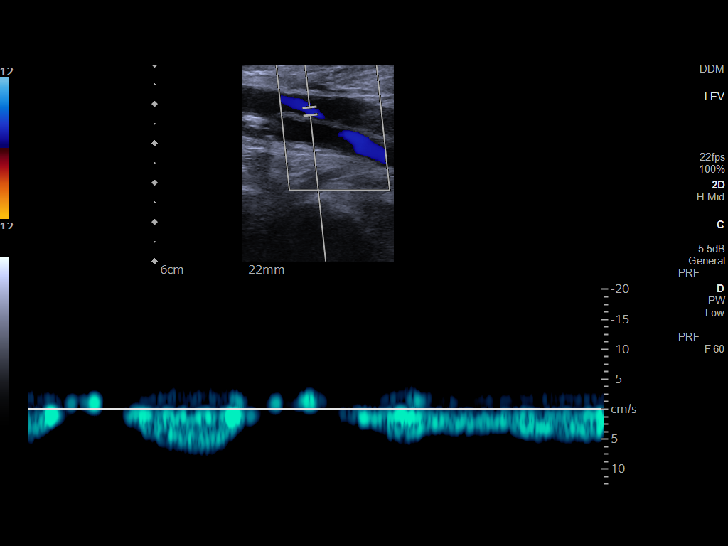
[im 32/35]
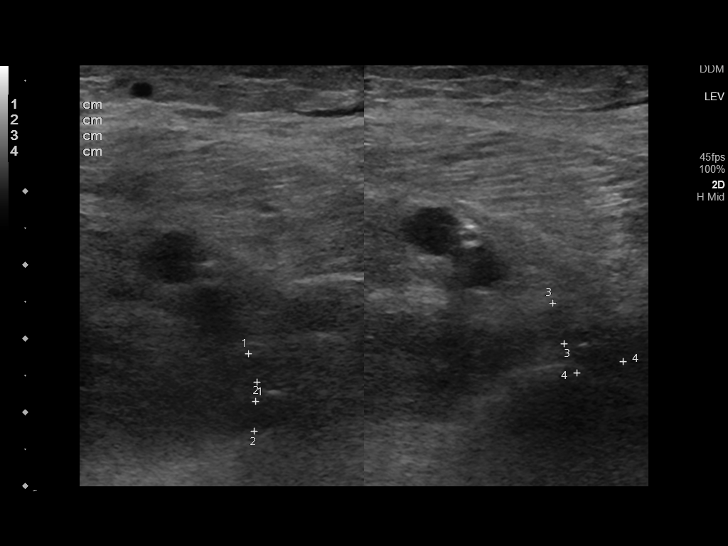
[im 35/35]
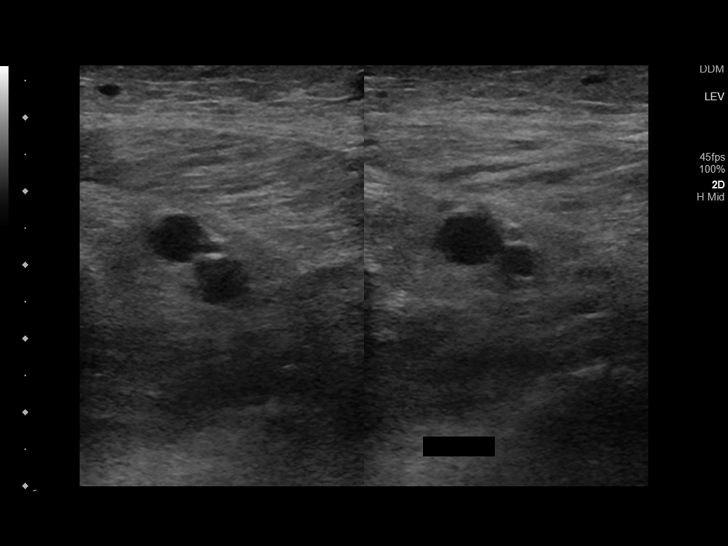

[14 of 24 positions shown; findings below may reference images not displayed]

FINDINGS: VENOUS

Right common femoral, saphenofemoral junction, profunda femoral and
femoral veins are widely patent. Nonocclusive thrombus noted in the
popliteal vein. Occlusive thrombus noted the posterior tibial and
peroneal veins. Contralateral left common femoral vein widely
patent.

OTHER

None.
IMPRESSION: Nonocclusive thrombus noted in the right popliteal vein. Occlusive
thrombus noted in the right posterior tibial and peroneal veins.

## 2020-12-25 NOTE — Progress Notes (Signed)
Hematology/Oncology Consult Note [place] at [facility] [phone] [phone]  Patient Care Team: Sallee Lange, NP as PCP - General (Internal Medicine)   Name of the patient: Douglas Lyons  [id number]  [birth year]   Date of visit: 12/25/20  Diagnosis- DVT  Chief complaint/ Reason for visit- New VTE- referral from Dr. Lacinda Axon  History of Presenting Illness: Patient presents as referral from Dr. Lacinda Axon for new DVT of right posterior tibial and peroneal veins of right lower extremity. He recently underwent ACDF due to weakness. At post op on 12/19/20 he complained of swelling in right leg to Dr. Lacinda Axon. He underwent ultrasound which showed thrombosis in popliteal vein in the right loss that was occlusive. He was started on eliquis 5 mg twice daily.   12/19/20- US Venous Img Right Lower Extremity - nonocclusive thrombus noted in right popliteal vein. Occlusive thrombus noted in the right posterior tibial and peroneal veins.   Today, he feels well. Swelling in leg has improved. He denies history of blood clots. No recent infections. Mobility impaired recently due to recent surgery. No history of IBS, MN, nephrotic syndrome, or lupus. No family history of thrombosis that he is aware of. No loss of appetite, weight loss, or fatigue. No cough, hemoptysis, changes to bowel movements, or hematuria.   ECOG FS:1 - Symptomatic but completely ambulatory  Review of systems- Review of Systems  Constitutional: Negative for chills, fever, malaise/fatigue and weight loss.  HENT: Negative for hearing loss, nosebleeds, sore throat and tinnitus.   Eyes: Negative for blurred vision and double vision.  Respiratory: Negative for cough, hemoptysis, shortness of breath and wheezing.   Cardiovascular: Negative for chest pain, palpitations and leg swelling.  Gastrointestinal: Negative for abdominal pain, blood in stool, constipation, diarrhea, melena, nausea and vomiting.  Genitourinary:  Negative for dysuria, hematuria and urgency.  Musculoskeletal: Negative for back pain, falls, joint pain, myalgias and neck pain (post acdf).  Skin: Negative for itching and rash.  Neurological: Negative for dizziness, tingling, sensory change, loss of consciousness, weakness and headaches.  Endo/Heme/Allergies: Negative for environmental allergies. Does not bruise/bleed easily.  Psychiatric/Behavioral: Negative for depression. The patient is not nervous/anxious and does not have insomnia.     No Known Allergies  Past Medical History:  Diagnosis Date  . Cataracts, bilateral   . Degenerative joint disease of knee, left   . Diabetes mellitus, type 2 ([place])   . Diverticulosis   . Erectile dysfunction   . GERD (gastroesophageal reflux disease)   . Heme positive stool   . Hyperlipidemia   . Hypertension   . Lumbar disc herniation   . Overweight (BMI 25.0-29.9)     Past Surgical History:  Procedure Laterality Date  . ANTERIOR CERVICAL DECOMP/DISCECTOMY FUSION N/A 12/01/2020   Procedure: ANTERIOR CERVICAL DECOMPRESSION/DISCECTOMY FUSION 2 LEVELS C3-4, C4-5;  Surgeon: Deetta Perla, MD;  Location: [facility] ORS;  Service: Neurosurgery;  Laterality: N/A;  . COLONOSCOPY    . COLONOSCOPY N/A 07/26/2015   Procedure: COLONOSCOPY;  Surgeon: Manya Silvas, MD;  Location: [facility] ENDOSCOPY;  Service: Endoscopy;  Laterality: N/A;  . COLONOSCOPY N/A 09/29/2020   Procedure: COLONOSCOPY;  Surgeon: [name], MD;  Location: [facility] ENDOSCOPY;  Service: Endoscopy;  Laterality: N/A;  . SHOULDER ARTHROSCOPY Right   . VASECTOMY      Social History   Socioeconomic History  . Marital status: Married    Spouse name: Not on file  . Number of children: Not on file  . Years of education:  Not on file  . Highest education level: Not on file  Occupational History  . Not on file  Tobacco Use  . Smoking status: Never Smoker  . Smokeless tobacco: Never Used  Vaping Use  . Vaping Use: Never used  Substance  and Sexual Activity  . Alcohol use: Yes    Comment: OCCASIONAL   . Drug use: Not Currently  . Sexual activity: Not on file  Other Topics Concern  . Not on file  Social History Narrative  . 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 Wife accompanies him today.   No family history on file.   Current Outpatient Medications:  .  acetaminophen (TYLENOL) 650 MG CR tablet, Take 650 mg by mouth in the morning and at bedtime., Disp: , Rfl:  .  aspirin EC 81 MG tablet, Take 81 mg by mouth daily., Disp: , Rfl:  .  cholecalciferol (VITAMIN D3) 25 MCG (1000 UNIT) tablet, Take 1,000 Units by mouth daily., Disp: , Rfl:  .  enalapril (VASOTEC) 5 MG tablet, Take 5 mg by mouth daily., Disp: , Rfl:  .  glipiZIDE-metformin (METAGLIP) 2.5-500 MG tablet, Take 1 tablet by mouth 2 (two) times daily before a meal., Disp: , Rfl:  .  HYDROcodone-acetaminophen (NORCO/VICODIN) 5-325 MG tablet, Take 1-2 tablets by mouth every 4 (four) hours as needed for moderate pain., Disp: 30 tablet, Rfl: 0 .  rosuvastatin (CRESTOR) 20 MG tablet, Take 20 mg by mouth daily., Disp: , Rfl:  .  senna-docusate (SENOKOT-S) 8.6-50 MG tablet, Take 1 tablet by mouth 2 (two) times daily., Disp: 60 tablet, Rfl: 0  Physical exam:  Vitals:   12/26/20 0954  BP: 133/72  Pulse: 65  Resp: 20  Temp: (!) 97.4 F (36.3 C)  SpO2: 98%  Weight: 189 lb 6 oz (85.9 kg)   Physical Exam Constitutional:      General: He is not in acute distress.    Appearance: He is well-developed.     Comments: Appears stated age. In wheelchair.   HENT:     Head: Normocephalic and atraumatic.     Mouth/Throat:     Pharynx: No oropharyngeal exudate.  Eyes:     General:  No scleral icterus.    Conjunctiva/sclera: Conjunctivae normal.  Cardiovascular:     Rate and Rhythm: Normal rate and regular rhythm.  Pulmonary:     Effort: Pulmonary effort is normal.     Breath sounds: Normal breath sounds. No wheezing.  Abdominal:     General: Bowel sounds are normal. There is no distension.     Palpations: Abdomen is soft. There is no mass.     Tenderness: There is no abdominal tenderness.  Musculoskeletal:        General: No tenderness. Normal range of motion.     Cervical back: Normal range of motion and neck supple.     Right lower leg: Edema (1+) present.     Left lower leg: No edema.  Lymphadenopathy:     Cervical: No cervical adenopathy.  Skin:    General: Skin is warm and dry.  Neurological:     General: No focal deficit present.     Mental  Status: He is alert and oriented to person, place, and time.  Psychiatric:        Mood and Affect: Mood normal.        Behavior: Behavior normal.      CMP Latest Ref Rng & Units 12/26/2020  Glucose 70 - 99 mg/dL 161(W153(H)  BUN 8 - 23 mg/dL 12  Creatinine 9.600.61 - 4.541.24 mg/dL 0.980.67  Sodium 119135 - 147145 mmol/L 132(L)  Potassium 3.5 - 5.1 mmol/L 4.1  Chloride 98 - 111 mmol/L 96(L)  CO2 22 - 32 mmol/L 29  Calcium 8.9 - 10.3 mg/dL 9.4  Total Protein 6.5 - 8.1 g/dL 7.2  Total Bilirubin 0.3 - 1.2 mg/dL 0.5  Alkaline Phos 38 - 126 U/L 61  AST 15 - 41 U/L 11(L)  ALT 0 - 44 U/L 11   CBC Latest Ref Rng & Units 12/26/2020  WBC 4.0 - 10.5 K/uL 4.9  Hemoglobin 13.0 - 17.0 g/dL 82.914.2  Hematocrit 56.239.0 - 52.0 % 41.2  Platelets 150 - 400 K/uL 280    No images are attached to the encounter.  DG Cervical Spine 2 or 3 views  Result Date: 12/01/2020 CLINICAL DATA:  Postop ACDF C3-C4-C4-C5 EXAM: CERVICAL SPINE - 2-3 VIEW COMPARISON:  Preoperative CT. FINDINGS: Anterior fusion C3 through C5 with interbody spacers. Intact hardware. No periprosthetic lucency. Normal alignment. Overlying cervical collar in place. IMPRESSION: Anterior  fusion C3 through C5 with interbody spacers. No immediate postoperative complication. Electronically Signed   By: Narda RutherfordMelanie  Sanford M.D.   On: 12/01/2020 18:10   DG Cervical Spine 2-3 Views  Result Date: 12/01/2020 CLINICAL DATA:  C3-4, C4-C5 ACDF. EXAM: DG C-ARM 1-60 MIN; CERVICAL SPINE - 2-3 VIEW FLUOROSCOPY TIME:  Fluoroscopy Time:  12 seconds. Number of Acquired Spot Images: 2 COMPARISON:  CT November 27, 2020. FINDINGS: Two C-arm fluoroscopic images were obtained intraoperatively and submitted for post operative interpretation. The first image demonstrates a needle tip projecting at the C4-C5 interbody space anteriorly. The second image demonstrates C3-C5 ACDF with plate and screws and intervening spacers. Please see the performing provider's procedural report for further detail. IMPRESSION: Intraoperative fluoroscopy, as detailed above. Electronically Signed   By: Feliberto HartsFrederick S Jones MD   On: 12/01/2020 15:08   CT HEAD WO CONTRAST  Result Date: 11/26/2020 CLINICAL DATA:  Neuro deficit. Acute stroke suspected. Bilateral high on numbness for 2 weeks. EXAM: CT HEAD WITHOUT CONTRAST TECHNIQUE: Contiguous axial images were obtained from the base of the skull through the vertex without intravenous contrast. COMPARISON:  None. FINDINGS: Brain: No subdural, epidural, or subarachnoid hemorrhage identified. No mass effect or midline shift. Ventricles and sulci are unremarkable. Cerebellum, brainstem, and basal cisterns are normal. No acute cortical ischemia or infarct. Vascular: Calcified atherosclerosis in the intracranial carotids. Skull: Sclerosis is seen in the lateral calvarium as seen on series 3, image 48 no other abnormalities. Sinuses/Orbits: Fluid in the inferior left mastoid air cells identified without evidence of fracture or erosion. Mastoid air cells and middle ears are otherwise unremarkable. Other: None. IMPRESSION: 1. No acute intracranial abnormalities identified. 2. Sclerosis seen in the lateral  calvarium as seen on series 3, image 48 is nonspecific. Recommend a bone scan as an outpatient for further evaluation. Electronically Signed   By: Gerome Samavid  Williams III M.D   On: 11/26/2020 19:41   CT Cervical Spine Wo Contrast  Result Date: 11/27/2020 CLINICAL DATA:  Spinal stenosis EXAM: CT CERVICAL SPINE WITHOUT CONTRAST TECHNIQUE: Multidetector CT imaging of the cervical spine was performed  without intravenous contrast. Multiplanar CT image reconstructions were also generated. COMPARISON:  MRI cervical spine obtained earlier today FINDINGS: Alignment: Normal. Skull base and vertebrae: No acute fracture. No primary bone lesion or focal pathologic process. Soft tissues and spinal canal: No prevertebral fluid or swelling. No visible canal hematoma. Disc levels: Multilevel cervical spondylosis and bilateral right greater than left facet arthropathy. Upper chest: Left apical pleuroparenchymal scarring. Other: None. IMPRESSION: 1. No acute fracture, malalignment or bony lesion. 2. Multilevel cervical spondylosis and right worse than left facet arthropathy. Please see MRI of the cervical spine obtained earlier today for further detail of underlying neural impingement. Electronically Signed   By: Jacqulynn Cadet M.D.   On: 11/27/2020 06:07   MR BRAIN WO CONTRAST  Result Date: 11/27/2020 CLINICAL DATA:  Initial evaluation for neuro deficit, stroke suspected. Bilateral arm weakness and numbness. EXAM: MRI HEAD WITHOUT CONTRAST MRI CERVICAL SPINE WITHOUT CONTRAST TECHNIQUE: Multiplanar, multiecho pulse sequences of the brain and surrounding structures, and cervical spine, to include the craniocervical junction and cervicothoracic junction, were obtained without intravenous contrast. COMPARISON:  Prior CT from 11/26/2020 FINDINGS: MRI HEAD FINDINGS Brain: Generalized age-related cerebral atrophy. No significant cerebral white matter disease for age. No abnormal foci of restricted diffusion to suggest acute or subacute  ischemia. Gray-white matter differentiation maintained. No encephalomalacia to suggest chronic cortical infarction. No foci of susceptibility artifact to suggest acute or chronic intracranial hemorrhage. No mass lesion, midline shift or mass effect. No hydrocephalus or extra-axial fluid collection. Pituitary gland suprasellar region within normal limits. Midline structures intact. Vascular: Major intracranial vascular flow voids are maintained at the skull base. Skull and upper cervical spine: Craniocervical junction within normal limits. Bone marrow signal intensity normal. Note made of a 2.7 cm T2 hypointense sclerotic lesion at the left calvarium, also seen on prior CT. No other focal marrow replacing lesion. Sinuses/Orbits: Globes and orbital soft tissues demonstrate no acute finding. Paranasal sinuses are clear. Small bilateral mastoid effusions noted, of doubtful significance. Inner ear structures grossly normal. Visualized nasopharynx within normal limits. Other: None. MRI CERVICAL SPINE FINDINGS Alignment: Vertebral bodies normally aligned with preservation of the normal cervical lordosis. No listhesis. Vertebrae: Vertebral body height maintained without acute or chronic fracture. Bone marrow signal intensity within normal limits. No discrete or worrisome osseous lesions. Discogenic reactive endplate change noted about the C3-4 interspace. Reactive marrow edema noted about the right C3-4 facet as well due to facet arthritis. No other abnormal marrow edema. Cord: Patchy signal abnormality seen involving the left greater than right cord at the level of C3-4, consistent with compressive myelopathy (series 10, image 10). Signal intensity within the cervical spinal cord otherwise within normal limits. Posterior Fossa, vertebral arteries, paraspinal tissues: Craniocervical junction normal. Paraspinous and prevertebral soft tissues normal. Normal flow voids seen within the vertebral arteries bilaterally. Disc  levels: C2-C3: Disc bulge with bilateral uncovertebral hypertrophy. No spinal stenosis. Moderate left C3 foraminal narrowing. Right neural foramen remains patent. C3-C4: Degenerative intervertebral disc space narrowing with diffuse disc osteophyte complex. Broad posterior component indents and effaces the ventral thecal sac, contacting and flattening the cervical spinal cord. Associated cord signal changes consistent with myelopathy. Superimposed severe right-sided facet degeneration. Resultant severe spinal stenosis with the thecal sac measuring 3 mm in AP diameter at its most narrow point. Severe bilateral C4 foraminal narrowing. C4-C5: Degenerative intervertebral disc space narrowing with diffuse disc osteophyte complex. Broad central to left paracentral posterior component indents and partially faces the ventral thecal sac (series 10, image 14).  Well cord flattening without cord signal changes. Moderate spinal stenosis. Severe left worse than right C5 foraminal stenosis. C5-C6: Diffuse disc bulge with bilateral uncovertebral hypertrophy. Right worse than left facet degeneration. Resultant mild to moderate spinal stenosis without cord impingement. Severe right worse than left C6 foraminal narrowing. C6-C7: Diffuse disc bulge with bilateral uncovertebral hypertrophy. Flattening of the ventral thecal sac with resultant mild spinal stenosis. No cord impingement. Severe left with moderate right C7 foraminal stenosis. C7-T1: Negative interspace. Left worse than right facet degeneration. No spinal stenosis. Foramina remain patent. Visualized upper thoracic spine demonstrates no significant finding. IMPRESSION: MRI HEAD IMPRESSION: 1. Negative brain MRI for age. No acute intracranial abnormality identified. 2. 2.7 cm sclerotic lesion involving the left calvarium, also seen on prior head CT. Again, further evaluation with nonemergent outpatient bone scan recommended for further evaluation. MRI CERVICAL SPINE IMPRESSION:  1. Degenerative disc osteophyte at C3-4 with resultant severe spinal stenosis and flattening of the cervical spinal cord. 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 2. Additional degenerative spondylosis at C4-5 and C5-6 with resultant moderate spinal stenosis. 3. Multifactorial degenerative changes with resultant multilevel foraminal narrowing as above. Notable findings include moderate left C3 foraminal stenosis, with severe bilateral C4, C5, C6, and left C7 foraminal narrowing. 4. Reactive marrow edema about the right C3-4 facet due to facet arthritis. Finding could serve as a source for neck pain. Electronically Signed   By: Jeannine Boga M.D.   On: 11/27/2020 02:06   MR Cervical Spine Wo Contrast  Result Date: 11/27/2020 CLINICAL DATA:  Initial evaluation for neuro deficit, stroke suspected. Bilateral arm weakness and numbness. EXAM: MRI HEAD WITHOUT CONTRAST MRI CERVICAL SPINE WITHOUT CONTRAST TECHNIQUE: Multiplanar, multiecho pulse sequences of the brain and surrounding structures, and cervical spine, to include the craniocervical junction and cervicothoracic junction, were obtained without intravenous contrast. COMPARISON:  Prior CT from 11/26/2020 FINDINGS: MRI HEAD FINDINGS Brain: Generalized age-related cerebral atrophy. No significant cerebral white matter disease for age. No abnormal foci of restricted diffusion to suggest acute or subacute ischemia. Gray-white matter differentiation maintained. No encephalomalacia to suggest chronic cortical infarction. No foci of susceptibility artifact to suggest acute or chronic intracranial hemorrhage. No mass lesion, midline shift or mass effect. No hydrocephalus or extra-axial fluid collection. Pituitary gland suprasellar region within normal limits. Midline structures intact. Vascular: Major intracranial vascular flow voids are maintained at the skull base. Skull and upper cervical spine: Craniocervical junction within  normal limits. Bone marrow signal intensity normal. Note made of a 2.7 cm T2 hypointense sclerotic lesion at the left calvarium, also seen on prior CT. No other focal marrow replacing lesion. Sinuses/Orbits: Globes and orbital soft tissues demonstrate no acute finding. Paranasal sinuses are clear. Small bilateral mastoid effusions noted, of doubtful significance. Inner ear structures grossly normal. Visualized nasopharynx within normal limits. Other: None. MRI CERVICAL SPINE FINDINGS Alignment: Vertebral bodies normally aligned with preservation of the normal cervical lordosis. No listhesis. Vertebrae: Vertebral body height maintained without acute or chronic fracture. Bone marrow signal intensity within normal limits. No discrete or worrisome osseous lesions. Discogenic reactive endplate change noted about the C3-4 interspace. Reactive marrow edema noted about the right C3-4 facet as well due to facet arthritis. No other abnormal marrow edema. Cord: Patchy signal abnormality seen involving the left greater than right cord at the level of C3-4, consistent with compressive myelopathy (series 10, image 10). Signal intensity within the cervical spinal cord otherwise within normal limits. Posterior Fossa, vertebral arteries, paraspinal tissues: Craniocervical junction  normal. Paraspinous and prevertebral soft tissues normal. Normal flow voids seen within the vertebral arteries bilaterally. Disc levels: C2-C3: Disc bulge with bilateral uncovertebral hypertrophy. No spinal stenosis. Moderate left C3 foraminal narrowing. Right neural foramen remains patent. C3-C4: Degenerative intervertebral disc space narrowing with diffuse disc osteophyte complex. Broad posterior component indents and effaces the ventral thecal sac, contacting and flattening the cervical spinal cord. Associated cord signal changes consistent with myelopathy. Superimposed severe right-sided facet degeneration. Resultant severe spinal stenosis with the  thecal sac measuring 3 mm in AP diameter at its most narrow point. Severe bilateral C4 foraminal narrowing. C4-C5: Degenerative intervertebral disc space narrowing with diffuse disc osteophyte complex. Broad central to left paracentral posterior component indents and partially faces the ventral thecal sac (series 10, image 14). Well cord flattening without cord signal changes. Moderate spinal stenosis. Severe left worse than right C5 foraminal stenosis. C5-C6: Diffuse disc bulge with bilateral uncovertebral hypertrophy. Right worse than left facet degeneration. Resultant mild to moderate spinal stenosis without cord impingement. Severe right worse than left C6 foraminal narrowing. C6-C7: Diffuse disc bulge with bilateral uncovertebral hypertrophy. Flattening of the ventral thecal sac with resultant mild spinal stenosis. No cord impingement. Severe left with moderate right C7 foraminal stenosis. C7-T1: Negative interspace. Left worse than right facet degeneration. No spinal stenosis. Foramina remain patent. Visualized upper thoracic spine demonstrates no significant finding. IMPRESSION: MRI HEAD IMPRESSION: 1. Negative brain MRI for age. No acute intracranial abnormality identified. 2. 2.7 cm sclerotic lesion involving the left calvarium, also seen on prior head CT. Again, further evaluation with nonemergent outpatient bone scan recommended for further evaluation. MRI CERVICAL SPINE IMPRESSION: 1. Degenerative disc osteophyte at C3-4 with resultant severe spinal stenosis and flattening of the cervical spinal cord. 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 2. Additional degenerative spondylosis at C4-5 and C5-6 with resultant moderate spinal stenosis. 3. Multifactorial degenerative changes with resultant multilevel foraminal narrowing as above. Notable findings include moderate left C3 foraminal stenosis, with severe bilateral C4, C5, C6, and left C7 foraminal narrowing. 4. Reactive  marrow edema about the right C3-4 facet due to facet arthritis. Finding could serve as a source for neck pain. Electronically Signed   By: Jeannine Boga M.D.   On: 11/27/2020 02:06   US Venous Img Lower Unilateral Right (DVT)  Result Date: 12/19/2020 CLINICAL DATA:  Pain and swelling right lower extremity. EXAM: Right LOWER EXTREMITY VENOUS DOPPLER ULTRASOUND TECHNIQUE: Gray-scale sonography with compression, as well as color and duplex ultrasound, were performed to evaluate the deep venous system(s) from the level of the common femoral vein through the popliteal and proximal calf veins. COMPARISON:  None. FINDINGS: VENOUS Right common femoral, saphenofemoral junction, profunda femoral and femoral veins are widely patent. Nonocclusive thrombus noted in the popliteal vein. Occlusive thrombus noted the posterior tibial and peroneal veins. Contralateral left common femoral vein widely patent. OTHER None. IMPRESSION: Nonocclusive thrombus noted in the right popliteal vein. Occlusive thrombus noted in the right posterior tibial and peroneal veins. Electronically Signed   By: Marcello Moores  Register   On: 12/19/2020 10:45   DG C-Arm 1-60 Min  Result Date: 12/01/2020 CLINICAL DATA:  C3-4, C4-C5 ACDF. EXAM: DG C-ARM 1-60 MIN; CERVICAL SPINE - 2-3 VIEW FLUOROSCOPY TIME:  Fluoroscopy Time:  12 seconds. Number of Acquired Spot Images: 2 COMPARISON:  CT November 27, 2020. FINDINGS: Two C-arm fluoroscopic images were obtained intraoperatively and submitted for post operative interpretation. The first image demonstrates a needle tip projecting at the C4-C5 interbody space  anteriorly. The second image demonstrates C3-C5 ACDF with plate and screws and intervening spacers. Please see the performing provider's procedural report for further detail. IMPRESSION: Intraoperative fluoroscopy, as detailed above. Electronically Signed   By: Margaretha Sheffield MD   On: 12/01/2020 15:08    Assessment and plan- Patient is a 75 y.o. male  diagnosed with DVT who presents for evaluation and management. We reviewed risk factors for thrombosis, pregnancy, obesity, malignancy, dehydration, pelvic/lower extremity surgery, immobility, COVID, clotting deficiencies. Based on recent surgery, impaired mobility, I suspect this is a provoked dvt. Today, will check initial hypercoaguable work up to determine length of use of anticoagulation. In interim, continue eliquis 5 mg BID for at least 3 months. Avoid risk factors when possible. Reviewed bleeding precautions with eliquis. If future surgeries or increased risk, patient may benefit from use of anticoagulation at those times (extended travel, surgery, etc.)  Hypercoagulation panel  ATIII>>>  Protein C activity>>> Protein C total>>> Protein S activity>>> Protein S total>>> Lupus anticoagulant>>> Beta-2 glycoprotein>>> Homocysteine>>> Factor 5 leiden>>> Prothrombin gene mutation >>> Cardiolipin antibodies IgG, IgM,IgA>>>  Continue Eliquis.5 mg BID x 90 days.  Plan:  rtc in 1 month to review labs.    Visit Diagnosis 1. Acute deep vein thrombosis (DVT) of tibial vein of right lower extremity ([place])     Patient expressed understanding and was in agreement with this plan. He also understands that He can call clinic at any time with any questions, concerns, or complaints.   Thank you for allowing me to participate in the care of this very pleasant patient.   Beckey Rutter, DNP, AGNP-[facility] [place]

## 2020-12-26 ENCOUNTER — Inpatient Hospital Stay: Payer: Medicare Other | Attending: Nurse Practitioner | Admitting: Nurse Practitioner

## 2020-12-26 ENCOUNTER — Inpatient Hospital Stay: Payer: Medicare Other

## 2020-12-26 ENCOUNTER — Encounter: Payer: Self-pay | Admitting: Nurse Practitioner

## 2020-12-26 ENCOUNTER — Other Ambulatory Visit: Payer: Self-pay

## 2020-12-26 VITALS — BP 133/72 | HR 65 | Temp 97.4°F | Resp 20 | Wt 189.4 lb

## 2020-12-26 DIAGNOSIS — Z7901 Long term (current) use of anticoagulants: Secondary | ICD-10-CM | POA: Diagnosis not present

## 2020-12-26 DIAGNOSIS — K219 Gastro-esophageal reflux disease without esophagitis: Secondary | ICD-10-CM | POA: Insufficient documentation

## 2020-12-26 DIAGNOSIS — I10 Essential (primary) hypertension: Secondary | ICD-10-CM | POA: Insufficient documentation

## 2020-12-26 DIAGNOSIS — E785 Hyperlipidemia, unspecified: Secondary | ICD-10-CM | POA: Diagnosis not present

## 2020-12-26 DIAGNOSIS — I82441 Acute embolism and thrombosis of right tibial vein: Secondary | ICD-10-CM | POA: Diagnosis not present

## 2020-12-26 DIAGNOSIS — Z7982 Long term (current) use of aspirin: Secondary | ICD-10-CM | POA: Diagnosis not present

## 2020-12-26 DIAGNOSIS — M5126 Other intervertebral disc displacement, lumbar region: Secondary | ICD-10-CM | POA: Insufficient documentation

## 2020-12-26 DIAGNOSIS — I749 Embolism and thrombosis of unspecified artery: Secondary | ICD-10-CM

## 2020-12-26 DIAGNOSIS — Z7984 Long term (current) use of oral hypoglycemic drugs: Secondary | ICD-10-CM | POA: Diagnosis not present

## 2020-12-26 DIAGNOSIS — Z79899 Other long term (current) drug therapy: Secondary | ICD-10-CM | POA: Diagnosis not present

## 2020-12-26 DIAGNOSIS — I82459 Acute embolism and thrombosis of unspecified peroneal vein: Secondary | ICD-10-CM | POA: Insufficient documentation

## 2020-12-26 DIAGNOSIS — E118 Type 2 diabetes mellitus with unspecified complications: Secondary | ICD-10-CM | POA: Diagnosis not present

## 2020-12-26 LAB — COMPREHENSIVE METABOLIC PANEL
ALT: 11 U/L (ref 0–44)
AST: 11 U/L — ABNORMAL LOW (ref 15–41)
Albumin: 3.7 g/dL (ref 3.5–5.0)
Alkaline Phosphatase: 61 U/L (ref 38–126)
Anion gap: 7 (ref 5–15)
BUN: 12 mg/dL (ref 8–23)
CO2: 29 mmol/L (ref 22–32)
Calcium: 9.4 mg/dL (ref 8.9–10.3)
Chloride: 96 mmol/L — ABNORMAL LOW (ref 98–111)
Creatinine, Ser: 0.67 mg/dL (ref 0.61–1.24)
GFR, Estimated: >60 mL/min (ref >60)
Glucose, Bld: 153 mg/dL — ABNORMAL HIGH (ref 70–99)
Potassium: 4.1 mmol/L (ref 3.5–5.1)
Sodium: 132 mmol/L — ABNORMAL LOW (ref 135–145)
Total Bilirubin: 0.5 mg/dL (ref 0.3–1.2)
Total Protein: 7.2 g/dL (ref 6.5–8.1)

## 2020-12-26 LAB — CBC WITH DIFFERENTIAL/PLATELET
Abs Immature Granulocytes: 0.01 10*3/uL (ref 0.00–0.07)
Basophils Absolute: 0 10*3/uL (ref 0.0–0.1)
Basophils Relative: 1 %
Eosinophils Absolute: 0.1 10*3/uL (ref 0.0–0.5)
Eosinophils Relative: 2 %
HCT: 41.2 % (ref 39.0–52.0)
Hemoglobin: 14.2 g/dL (ref 13.0–17.0)
Immature Granulocytes: 0 %
Lymphocytes Relative: 23 %
Lymphs Abs: 1.1 10*3/uL (ref 0.7–4.0)
MCH: 30.3 pg (ref 26.0–34.0)
MCHC: 34.5 g/dL (ref 30.0–36.0)
MCV: 87.8 fL (ref 80.0–100.0)
Monocytes Absolute: 0.5 10*3/uL (ref 0.1–1.0)
Monocytes Relative: 11 %
Neutro Abs: 3.1 10*3/uL (ref 1.7–7.7)
Neutrophils Relative %: 63 %
Platelets: 280 10*3/uL (ref 150–400)
RBC: 4.69 MIL/uL (ref 4.22–5.81)
RDW: 13.1 % (ref 11.5–15.5)
WBC: 4.9 10*3/uL (ref 4.0–10.5)
nRBC: 0 % (ref 0.0–0.2)

## 2020-12-26 LAB — ANTITHROMBIN III: AntiThromb III Func: 115 % (ref 75–120)

## 2020-12-27 LAB — LUPUS ANTICOAGULANT PANEL
DRVVT: 59.4 s — ABNORMAL HIGH (ref 0.0–47.0)
PTT Lupus Anticoagulant: 40.6 s (ref 0.0–51.9)

## 2020-12-27 LAB — CARDIOLIPIN ANTIBODIES, IGG, IGM, IGA
Anticardiolipin IgA: <9 APL U/mL (ref 0–11)
Anticardiolipin IgG: <9 GPL U/mL (ref 0–14)
Anticardiolipin IgM: <9 MPL U/mL (ref 0–12)

## 2020-12-27 LAB — DRVVT CONFIRM: dRVVT Confirm: 1 ratio (ref 0.8–1.2)

## 2020-12-27 LAB — HOMOCYSTEINE: Homocysteine: 11.5 umol/L (ref 0.0–19.2)

## 2020-12-27 LAB — PROTEIN C ACTIVITY: Protein C Activity: 170 % (ref 73–180)

## 2020-12-27 LAB — PROTEIN S ACTIVITY: Protein S Activity: 108 % (ref 63–140)

## 2020-12-27 LAB — DRVVT MIX: dRVVT Mix: 46.3 s — ABNORMAL HIGH (ref 0.0–40.4)

## 2020-12-27 LAB — PROTEIN S, TOTAL: Protein S Ag, Total: 116 % (ref 60–150)

## 2020-12-27 LAB — PROTEIN C, TOTAL: Protein C, Total: 144 % (ref 60–150)

## 2021-01-01 LAB — FACTOR 5 LEIDEN

## 2021-01-01 LAB — PROTHROMBIN GENE MUTATION

## 2021-01-26 ENCOUNTER — Inpatient Hospital Stay: Payer: Medicare Other | Attending: Internal Medicine | Admitting: Internal Medicine

## 2021-01-26 ENCOUNTER — Other Ambulatory Visit: Payer: Self-pay

## 2021-01-26 ENCOUNTER — Encounter: Payer: Self-pay | Admitting: Internal Medicine

## 2021-01-26 DIAGNOSIS — E663 Overweight: Secondary | ICD-10-CM | POA: Insufficient documentation

## 2021-01-26 DIAGNOSIS — I82431 Acute embolism and thrombosis of right popliteal vein: Secondary | ICD-10-CM

## 2021-01-26 DIAGNOSIS — Z86718 Personal history of other venous thrombosis and embolism: Secondary | ICD-10-CM | POA: Diagnosis not present

## 2021-01-26 DIAGNOSIS — Z79899 Other long term (current) drug therapy: Secondary | ICD-10-CM | POA: Diagnosis not present

## 2021-01-26 DIAGNOSIS — E785 Hyperlipidemia, unspecified: Secondary | ICD-10-CM | POA: Insufficient documentation

## 2021-01-26 DIAGNOSIS — I82441 Acute embolism and thrombosis of right tibial vein: Secondary | ICD-10-CM | POA: Diagnosis not present

## 2021-01-26 DIAGNOSIS — Z7901 Long term (current) use of anticoagulants: Secondary | ICD-10-CM | POA: Diagnosis not present

## 2021-01-26 NOTE — Progress Notes (Signed)
[place] NOTE  Patient Care Team: Gauger, Victoriano Lain, NP as PCP - General (Internal Medicine)  CHIEF COMPLAINTS/PURPOSE OF CONSULTATION: DV of right LE.   # April 26th, 2022- PROVOKED [post neck Surgery; April 8th, 2022]Nonocclusive thrombus noted in the right popliteal vein. Occlusive thrombus noted in the right posterior tibial and peroneal veins- on eliuis [until end of July 2022]   Oncology History   No history exists.     HISTORY OF PRESENTING ILLNESS:  Douglas Lyons 75 y.o.  male with recent history of nonocclusive DVT of the right lower extremity/popliteal vein currently on Eliquis is here for follow-up.  Patient had a blood clot post surgery on his neck.   Patient's pain is improved.  He denies any swelling in the legs.  Denies any bleeding problems.  Review of Systems  Constitutional: Negative for chills, diaphoresis, fever, malaise/fatigue and weight loss.  HENT: Negative for nosebleeds and sore throat.   Eyes: Negative for double vision.  Respiratory: Negative for cough, hemoptysis, sputum production, shortness of breath and wheezing.   Cardiovascular: Negative for chest pain, palpitations, orthopnea and leg swelling.  Gastrointestinal: Negative for abdominal pain, blood in stool, constipation, diarrhea, heartburn, melena, nausea and vomiting.  Genitourinary: Negative for dysuria, frequency and urgency.  Musculoskeletal: Positive for back pain and joint pain.  Skin: Negative.  Negative for itching and rash.  Neurological: Negative for dizziness, tingling, focal weakness, weakness and headaches.  Endo/Heme/Allergies: Does not bruise/bleed easily.  Psychiatric/Behavioral: Negative for depression. The patient is not nervous/anxious and does not have insomnia.      MEDICAL HISTORY:  Past Medical History:  Diagnosis Date  . Cataracts, bilateral   . Degenerative joint disease of knee, left   . Diabetes mellitus, type 2 ([place])   .  Diverticulosis   . Erectile dysfunction   . GERD (gastroesophageal reflux disease)   . Heme positive stool   . Hyperlipidemia   . Hypertension   . Lumbar disc herniation   . Overweight (BMI 25.0-29.9)     SURGICAL HISTORY: Past Surgical History:  Procedure Laterality Date  . ANTERIOR CERVICAL DECOMP/DISCECTOMY FUSION N/A 12/01/2020   Procedure: ANTERIOR CERVICAL DECOMPRESSION/DISCECTOMY FUSION 2 LEVELS C3-4, C4-5;  Surgeon: Deetta Perla, MD;  Location: [facility] ORS;  Service: Neurosurgery;  Laterality: N/A;  . COLONOSCOPY    . COLONOSCOPY N/A 07/26/2015   Procedure: COLONOSCOPY;  Surgeon: Manya Silvas, MD;  Location: [facility] ENDOSCOPY;  Service: Endoscopy;  Laterality: N/A;  . COLONOSCOPY N/A 09/29/2020   Procedure: COLONOSCOPY;  Surgeon: [name], MD;  Location: [facility] ENDOSCOPY;  Service: Endoscopy;  Laterality: N/A;  . SHOULDER ARTHROSCOPY Right   . VASECTOMY      SOCIAL HISTORY: Social History   Socioeconomic History  . Marital status: Married    Spouse name: Not on file  . Number of children: Not on file  . Years of education: Not on file  . Highest education level: Not on file  Occupational History  . Not on file  Tobacco Use  . Smoking status: Never Smoker  . Smokeless tobacco: Never Used  Vaping Use  . Vaping Use: Never used  Substance and Sexual Activity  . Alcohol use: Yes    Comment: OCCASIONAL   . Drug use: Not Currently  . Sexual activity: Not on file  Other Topics Concern  . Not on file  Social History Narrative  . Not on file   Social Determinants of Health   Financial Resource Strain: Not  on file  Food Insecurity: Not on file  Transportation Needs: Not on file  Physical Activity: Not on file  Stress: Not on file  Social Connections: Not on file  Intimate Partner Violence: Not on file    FAMILY HISTORY: No family history on file.  ALLERGIES:  is allergic to rosuvastatin.  MEDICATIONS:  Current Outpatient Medications  Medication Sig  Dispense Refill  . acetaminophen (TYLENOL) 650 MG CR tablet Take 650 mg by mouth in the morning and at bedtime.    Marland Kitchen apixaban (ELIQUIS) 5 MG TABS tablet Take by mouth.    . cholecalciferol (VITAMIN D3) 25 MCG (1000 UNIT) tablet Take 1,000 Units by mouth daily.    . enalapril (VASOTEC) 5 MG tablet Take 5 mg by mouth daily.    Marland Kitchen glipiZIDE-metformin (METAGLIP) 2.5-500 MG tablet Take 1 tablet by mouth 2 (two) times daily before a meal.    . rosuvastatin (CRESTOR) 20 MG tablet Take 20 mg by mouth daily.    Marland Kitchen senna-docusate (SENOKOT-S) 8.6-50 MG tablet Take 1 tablet by mouth 2 (two) times daily. (Patient not taking: No sig reported) 60 tablet 0   No current facility-administered medications for this visit.      Marland Kitchen  PHYSICAL EXAMINATION: ECOG PERFORMANCE STATUS: 0 - Asymptomatic  Vitals:   01/26/21 1035  BP: (!) 144/90  Pulse: 73  Resp: 20  Temp: 98.1 F (36.7 C)  SpO2: 98%   Filed Weights   01/26/21 1035  Weight: 187 lb 11.6 oz (85.2 kg)    Physical Exam HENT:     Head: Normocephalic and atraumatic.     Mouth/Throat:     Pharynx: No oropharyngeal exudate.  Eyes:     Pupils: Pupils are equal, round, and reactive to light.  Cardiovascular:     Rate and Rhythm: Normal rate and regular rhythm.  Pulmonary:     Effort: No respiratory distress.     Breath sounds: No wheezing.  Abdominal:     General: Bowel sounds are normal. There is no distension.     Palpations: Abdomen is soft. There is no mass.     Tenderness: There is no abdominal tenderness. There is no guarding or rebound.  Musculoskeletal:        General: No tenderness. Normal range of motion.     Cervical back: Normal range of motion and neck supple.  Skin:    General: Skin is warm.  Neurological:     Mental Status: He is alert and oriented to person, place, and time.  Psychiatric:        Mood and Affect: Affect normal.      LABORATORY DATA:  I have reviewed the data as listed Lab Results  Component Value  Date   WBC 4.9 12/26/2020   HGB 14.2 12/26/2020   HCT 41.2 12/26/2020   MCV 87.8 12/26/2020   PLT 280 12/26/2020   Recent Labs    11/26/20 1849 11/28/20 0514 12/02/20 0440 12/03/20 0513 12/26/20 1137  NA 137   < > 135 135 132*  K 4.0   < > 4.5 4.3 4.1  CL 103   < > 100 98 96*  CO2 25   < > 24 27 29   GLUCOSE 150*   < > 197* 198* 153*  BUN 16   < > 16 13 12   CREATININE 0.69   < > 0.87 0.88 0.67  CALCIUM 9.2   < > 9.1 9.1 9.4  GFRNONAA >60   < > >60 >60 >  60  PROT 7.1  --   --   --  7.2  ALBUMIN 4.2  --   --   --  3.7  AST 12*  --   --   --  11*  ALT 12  --   --   --  11  ALKPHOS 58  --   --   --  61  BILITOT 0.8  --   --   --  0.5   < > = values in this interval not displayed.    RADIOGRAPHIC STUDIES: I have personally reviewed the radiological images as listed and agreed with the findings in the report. No results found.  ASSESSMENT & PLAN:   Acute deep vein thrombosis (DVT) of popliteal vein of right lower extremity ([facility]) #Acute DVT nonocclusive popliteal vein right lower extremity [provoked postsurgical surgery]-on Eliquis 5 mg twice daily; recommend 3 months of anticoagulation.   #Reviewed the hypercoagulable work-up-essentially unremarkable; slightly abnormal DRV VT mixing study.  Again reviewed bleeding precautions with eliquis. If future surgeries or increased risk, patient may benefit from use of anticoagulation at those times [extended travel, surgery, etc].  If clinically doing well could be discharged in the clinic.  # DISPOSITION: # follow up in 3 months [Tuesday]- MD; labs- cbc/cmp- Dr.B  All questions were answered. The patient knows to call the clinic with any problems, questions or concerns.    Cammie Sickle, MD 01/26/2021 12:57 PM

## 2021-01-26 NOTE — Assessment & Plan Note (Addendum)
#  Acute DVT nonocclusive popliteal vein right lower extremity [provoked postsurgical surgery]-on Eliquis 5 mg twice daily; recommend 3 months of anticoagulation.   #Reviewed the hypercoagulable work-up-essentially unremarkable; slightly abnormal DRV VT mixing study.  Again reviewed bleeding precautions with eliquis. If future surgeries or increased risk, patient may benefit from use of anticoagulation at those times [extended travel, surgery, etc].  If clinically doing well could be discharged in the clinic.  # DISPOSITION: # follow up in 3 months [Tuesday]- MD; labs- cbc/cmp- Dr.B

## 2021-02-09 ENCOUNTER — Encounter: Payer: Self-pay | Admitting: Emergency Medicine

## 2021-02-09 ENCOUNTER — Telehealth: Payer: Self-pay | Admitting: *Deleted

## 2021-02-09 ENCOUNTER — Emergency Department
Admission: EM | Admit: 2021-02-09 | Discharge: 2021-02-09 | Disposition: A | Discharge status: Home / Self Care | Payer: Medicare Other | Attending: Emergency Medicine | Admitting: Emergency Medicine

## 2021-02-09 DIAGNOSIS — Z7901 Long term (current) use of anticoagulants: Secondary | ICD-10-CM | POA: Insufficient documentation

## 2021-02-09 DIAGNOSIS — K921 Melena: Secondary | ICD-10-CM | POA: Diagnosis present

## 2021-02-09 DIAGNOSIS — Z5321 Procedure and treatment not carried out due to patient leaving prior to being seen by health care provider: Secondary | ICD-10-CM | POA: Insufficient documentation

## 2021-02-09 LAB — CBC
HCT: 41.3 % (ref 39.0–52.0)
Hemoglobin: 14.2 g/dL (ref 13.0–17.0)
MCH: 30.3 pg (ref 26.0–34.0)
MCHC: 34.4 g/dL (ref 30.0–36.0)
MCV: 88.2 fL (ref 80.0–100.0)
Platelets: 226 10*3/uL (ref 150–400)
RBC: 4.68 MIL/uL (ref 4.22–5.81)
RDW: 13.7 % (ref 11.5–15.5)
WBC: 6.7 10*3/uL (ref 4.0–10.5)
nRBC: 0 % (ref 0.0–0.2)

## 2021-02-09 LAB — COMPREHENSIVE METABOLIC PANEL
ALT: 11 U/L (ref 0–44)
AST: 17 U/L (ref 15–41)
Albumin: 3.8 g/dL (ref 3.5–5.0)
Alkaline Phosphatase: 58 U/L (ref 38–126)
Anion gap: 7 (ref 5–15)
BUN: 14 mg/dL (ref 8–23)
CO2: 24 mmol/L (ref 22–32)
Calcium: 9.1 mg/dL (ref 8.9–10.3)
Chloride: 105 mmol/L (ref 98–111)
Creatinine, Ser: 0.78 mg/dL (ref 0.61–1.24)
GFR, Estimated: >60 mL/min (ref >60)
Glucose, Bld: 185 mg/dL — ABNORMAL HIGH (ref 70–99)
Potassium: 3.8 mmol/L (ref 3.5–5.1)
Sodium: 136 mmol/L (ref 135–145)
Total Bilirubin: 0.6 mg/dL (ref 0.3–1.2)
Total Protein: 6.5 g/dL (ref 6.5–8.1)

## 2021-02-09 LAB — TYPE AND SCREEN
ABO/RH(D): O NEG
Antibody Screen: NEGATIVE

## 2021-02-09 NOTE — ED Triage Notes (Signed)
Pt reports 2 dark, tarry stools in the last 24 hours. Pt on Eliquis since April for blood clot in the right lower leg. Pt sent to ED due to PCP not able to get pt in before weekend.

## 2021-02-09 NOTE — Telephone Encounter (Signed)
I called patient on other number and spoke with him. He was in agreement to go to ER stating, "OK, I'll go over there"

## 2021-02-09 NOTE — Telephone Encounter (Signed)
Patient called reporting that he is on Eliquis and he passed a black stool last evening. He called his PCP who told him to stop the Eliquis and call our office for further workup and instructions. Please advise

## 2021-02-09 NOTE — ED Notes (Signed)
Unable to convince patient to stay to be seen, pt states he will follow up with his PCP.

## 2021-02-09 NOTE — Telephone Encounter (Signed)
Call returned to patient and advised per Sharion Dove, NP and Dr Rogue Bussing to stay off the Eliquis and go to the ER for evaluation. I had to leave message on patient voice mail as he did not answer phone

## 2021-05-01 ENCOUNTER — Encounter: Payer: Self-pay | Admitting: Internal Medicine

## 2021-05-01 ENCOUNTER — Inpatient Hospital Stay (HOSPITAL_BASED_OUTPATIENT_CLINIC_OR_DEPARTMENT_OTHER): Payer: Medicare Other | Admitting: Internal Medicine

## 2021-05-01 ENCOUNTER — Other Ambulatory Visit: Payer: Self-pay

## 2021-05-01 ENCOUNTER — Inpatient Hospital Stay: Payer: Medicare Other | Attending: Internal Medicine

## 2021-05-01 DIAGNOSIS — Z86718 Personal history of other venous thrombosis and embolism: Secondary | ICD-10-CM | POA: Insufficient documentation

## 2021-05-01 DIAGNOSIS — E785 Hyperlipidemia, unspecified: Secondary | ICD-10-CM | POA: Diagnosis not present

## 2021-05-01 DIAGNOSIS — E119 Type 2 diabetes mellitus without complications: Secondary | ICD-10-CM | POA: Diagnosis not present

## 2021-05-01 DIAGNOSIS — Z79899 Other long term (current) drug therapy: Secondary | ICD-10-CM | POA: Diagnosis not present

## 2021-05-01 DIAGNOSIS — Z7984 Long term (current) use of oral hypoglycemic drugs: Secondary | ICD-10-CM | POA: Insufficient documentation

## 2021-05-01 DIAGNOSIS — Z7982 Long term (current) use of aspirin: Secondary | ICD-10-CM | POA: Insufficient documentation

## 2021-05-01 DIAGNOSIS — I10 Essential (primary) hypertension: Secondary | ICD-10-CM | POA: Diagnosis not present

## 2021-05-01 DIAGNOSIS — I82431 Acute embolism and thrombosis of right popliteal vein: Secondary | ICD-10-CM

## 2021-05-01 DIAGNOSIS — Z7901 Long term (current) use of anticoagulants: Secondary | ICD-10-CM | POA: Diagnosis not present

## 2021-05-01 LAB — CBC WITH DIFFERENTIAL/PLATELET
Abs Immature Granulocytes: 0.02 10*3/uL (ref 0.00–0.07)
Basophils Absolute: 0 10*3/uL (ref 0.0–0.1)
Basophils Relative: 1 %
Eosinophils Absolute: 0.1 10*3/uL (ref 0.0–0.5)
Eosinophils Relative: 2 %
HCT: 40.9 % (ref 39.0–52.0)
Hemoglobin: 14.1 g/dL (ref 13.0–17.0)
Immature Granulocytes: 0 %
Lymphocytes Relative: 23 %
Lymphs Abs: 1.3 10*3/uL (ref 0.7–4.0)
MCH: 30.8 pg (ref 26.0–34.0)
MCHC: 34.5 g/dL (ref 30.0–36.0)
MCV: 89.3 fL (ref 80.0–100.0)
Monocytes Absolute: 0.6 10*3/uL (ref 0.1–1.0)
Monocytes Relative: 10 %
Neutro Abs: 3.5 10*3/uL (ref 1.7–7.7)
Neutrophils Relative %: 64 %
Platelets: 217 10*3/uL (ref 150–400)
RBC: 4.58 MIL/uL (ref 4.22–5.81)
RDW: 13 % (ref 11.5–15.5)
WBC: 5.6 10*3/uL (ref 4.0–10.5)
nRBC: 0 % (ref 0.0–0.2)

## 2021-05-01 LAB — COMPREHENSIVE METABOLIC PANEL
ALT: 11 U/L (ref 0–44)
AST: 14 U/L — ABNORMAL LOW (ref 15–41)
Albumin: 3.9 g/dL (ref 3.5–5.0)
Alkaline Phosphatase: 58 U/L (ref 38–126)
Anion gap: 7 (ref 5–15)
BUN: 12 mg/dL (ref 8–23)
CO2: 26 mmol/L (ref 22–32)
Calcium: 9.1 mg/dL (ref 8.9–10.3)
Chloride: 103 mmol/L (ref 98–111)
Creatinine, Ser: 0.76 mg/dL (ref 0.61–1.24)
GFR, Estimated: >60 mL/min (ref >60)
Glucose, Bld: 121 mg/dL — ABNORMAL HIGH (ref 70–99)
Potassium: 4.3 mmol/L (ref 3.5–5.1)
Sodium: 136 mmol/L (ref 135–145)
Total Bilirubin: 0.6 mg/dL (ref 0.3–1.2)
Total Protein: 7 g/dL (ref 6.5–8.1)

## 2021-05-01 NOTE — Progress Notes (Signed)
[place] NOTE  Patient Care Team: Gauger, Douglas Lain, NP as PCP - General (Internal Medicine)  CHIEF COMPLAINTS/PURPOSE OF CONSULTATION: DV of right LE.   # April 26th, 2022- PROVOKED [post neck Surgery; April 8th, 2022]Nonocclusive thrombus noted in the right popliteal vein. Occlusive thrombus noted in the right posterior tibial and peroneal veins- on eliuis [until end of July 2022]     Oncology History   No history exists.     HISTORY OF PRESENTING ILLNESS:  Douglas Lyons 75 y.o.  male with recent history of nonocclusive DVT of the right lower extremity/popliteal vein currently on Eliquis is here for follow-up.  Patient stopped his Eliquis after 3 months in the end of July 2022.   Is gone back to work.  No falls.  No bleeding.  No new swelling in the leg.  Review of Systems  Constitutional:  Negative for chills, diaphoresis, fever, malaise/fatigue and weight loss.  HENT:  Negative for nosebleeds and sore throat.   Eyes:  Negative for double vision.  Respiratory:  Negative for cough, hemoptysis, sputum production, shortness of breath and wheezing.   Cardiovascular:  Negative for chest pain, palpitations, orthopnea and leg swelling.  Gastrointestinal:  Negative for abdominal pain, blood in stool, constipation, diarrhea, heartburn, melena, nausea and vomiting.  Genitourinary:  Negative for dysuria, frequency and urgency.  Musculoskeletal:  Positive for back pain and joint pain.  Skin: Negative.  Negative for itching and rash.  Neurological:  Negative for dizziness, tingling, focal weakness, weakness and headaches.  Endo/Heme/Allergies:  Does not bruise/bleed easily.  Psychiatric/Behavioral:  Negative for depression. The patient is not nervous/anxious and does not have insomnia.     MEDICAL HISTORY:  Past Medical History:  Diagnosis Date   Cataracts, bilateral    Degenerative joint disease of knee, left    Diabetes mellitus, type 2 ([facility])     Diverticulosis    Erectile dysfunction    GERD (gastroesophageal reflux disease)    Heme positive stool    Hyperlipidemia    Hypertension    Lumbar disc herniation    Overweight (BMI 25.0-29.9)     SURGICAL HISTORY: Past Surgical History:  Procedure Laterality Date   ANTERIOR CERVICAL DECOMP/DISCECTOMY FUSION N/A 12/01/2020   Procedure: ANTERIOR CERVICAL DECOMPRESSION/DISCECTOMY FUSION 2 LEVELS C3-4, C4-5;  Surgeon: Deetta Perla, MD;  Location: [facility] ORS;  Service: Neurosurgery;  Laterality: N/A;   COLONOSCOPY     COLONOSCOPY N/A 07/26/2015   Procedure: COLONOSCOPY;  Surgeon: Manya Silvas, MD;  Location: [facility] ENDOSCOPY;  Service: Endoscopy;  Laterality: N/A;   COLONOSCOPY N/A 09/29/2020   Procedure: COLONOSCOPY;  Surgeon: [name], MD;  Location: [facility] ENDOSCOPY;  Service: Endoscopy;  Laterality: N/A;   SHOULDER ARTHROSCOPY Right    VASECTOMY      SOCIAL HISTORY: Social History   Socioeconomic History   Marital status: Married    Spouse name: Not on file   Number of children: Not on file   Years of education: Not on file   Highest education level: Not on file  Occupational History   Not on file  Tobacco Use   Smoking status: Never   Smokeless tobacco: Never  Vaping Use   Vaping Use: Never used  Substance and Sexual Activity   Alcohol use: Yes    Comment: OCCASIONAL    Drug use: Not Currently   Sexual activity: Not on file  Other Topics Concern   Not on file  Social History Narrative   Not  on file   Social Determinants of Health   Financial Resource Strain: Not on file  Food Insecurity: Not on file  Transportation Needs: Not on file  Physical Activity: Not on file  Stress: Not on file  Social Connections: Not on file  Intimate Partner Violence: Not on file    FAMILY HISTORY: History reviewed. No pertinent family history.  ALLERGIES:  is allergic to rosuvastatin.  MEDICATIONS:  Current Outpatient Medications  Medication Sig Dispense Refill    acetaminophen (TYLENOL) 500 MG tablet Take by mouth.     acetaminophen (TYLENOL) 650 MG CR tablet Take 650 mg by mouth in the morning and at bedtime.     aspirin EC 81 MG tablet Take 81 mg by mouth daily. Swallow whole.     cholecalciferol (VITAMIN D3) 25 MCG (1000 UNIT) tablet Take 1,000 Units by mouth daily.     enalapril (VASOTEC) 5 MG tablet Take 1 tablet by mouth daily.     glipiZIDE-metformin (METAGLIP) 2.5-500 MG tablet Take 1 tablet by mouth 2 (two) times daily before a meal.     rosuvastatin (CRESTOR) 20 MG tablet Take 20 mg by mouth daily.     apixaban (ELIQUIS) 5 MG TABS tablet Take by mouth. (Patient not taking: Reported on 05/01/2021)     senna-docusate (SENOKOT-S) 8.6-50 MG tablet Take 1 tablet by mouth 2 (two) times daily. (Patient not taking: No sig reported) 60 tablet 0   No current facility-administered medications for this visit.      Marland Kitchen  PHYSICAL EXAMINATION: ECOG PERFORMANCE STATUS: 0 - Asymptomatic  Vitals:   05/01/21 1023  BP: (!) 149/84  Pulse: 64  Resp: 18  Temp: (!) 97.1 F (36.2 C)  SpO2: 100%   Filed Weights   05/01/21 1023  Weight: 196 lb 8.6 oz (89.1 kg)    Physical Exam HENT:     Head: Normocephalic and atraumatic.     Mouth/Throat:     Pharynx: No oropharyngeal exudate.  Eyes:     Pupils: Pupils are equal, round, and reactive to light.  Cardiovascular:     Rate and Rhythm: Normal rate and regular rhythm.  Pulmonary:     Effort: No respiratory distress.     Breath sounds: No wheezing.  Abdominal:     General: Bowel sounds are normal. There is no distension.     Palpations: Abdomen is soft. There is no mass.     Tenderness: There is no abdominal tenderness. There is no guarding or rebound.  Musculoskeletal:        General: No tenderness. Normal range of motion.     Cervical back: Normal range of motion and neck supple.  Skin:    General: Skin is warm.  Neurological:     Mental Status: He is alert and oriented to person, place, and  time.  Psychiatric:        Mood and Affect: Affect normal.     LABORATORY DATA:  I have reviewed the data as listed Lab Results  Component Value Date   WBC 5.6 05/01/2021   HGB 14.1 05/01/2021   HCT 40.9 05/01/2021   MCV 89.3 05/01/2021   PLT 217 05/01/2021   Recent Labs    12/26/20 1137 02/09/21 1614 05/01/21 1010  NA 132* 136 136  K 4.1 3.8 4.3  CL 96* 105 103  CO2 '29 24 26  '$ GLUCOSE 153* 185* 121*  BUN '12 14 12  '$ CREATININE 0.67 0.78 0.76  CALCIUM 9.4 9.1 9.1  GFRNONAA >60 >  60 >60  PROT 7.2 6.5 7.0  ALBUMIN 3.7 3.8 3.9  AST 11* 17 14*  ALT '11 11 11  '$ ALKPHOS 61 58 58  BILITOT 0.5 0.6 0.6    RADIOGRAPHIC STUDIES: I have personally reviewed the radiological images as listed and agreed with the findings in the report. No results found.  ASSESSMENT & PLAN:   Acute deep vein thrombosis (DVT) of popliteal vein of right lower extremity (HCC) #Acute DVT nonocclusive popliteal vein right lower extremity [provoked postsurgical surgery]-on Eliquis 5 mg twice daily x 3 months of anticoagulation. STOPPED in end of July, 2022.   #Reviewed the hypercoagulable work-up-essentially unremarkable; slightly abnormal DRV VT mixing study.  Again reviewed bleeding precautions with eliquis. If future surgeries or increased risk, patient may benefit from use of anticoagulation at those times [extended travel, surgery, etc].  #Since patient is clinically stable I think is reasonable for the patient to follow-up with PCP/can follow-up with [place] as needed.  Patient comfortable with the plan; to call us if any questions or concerns in the interim.  # DISPOSITION: # follow up as needed- Dr.B  All questions were answered. The patient knows to call the clinic with any problems, questions or concerns.    Cammie Sickle, MD 05/01/2021 12:22 PM

## 2021-05-01 NOTE — Assessment & Plan Note (Signed)
#  Acute DVT nonocclusive popliteal vein right lower extremity [provoked postsurgical surgery]-on Eliquis 5 mg twice daily x 3 months of anticoagulation. STOPPED in end of July, 2022.   #Reviewed the hypercoagulable work-up-essentially unremarkable; slightly abnormal DRV VT mixing study.  Again reviewed bleeding precautions with eliquis. If future surgeries or increased risk, patient may benefit from use of anticoagulation at those times [extended travel, surgery, etc].  #Since patient is clinically stable I think is reasonable for the patient to follow-up with PCP/can follow-up with [place] as needed.  Patient comfortable with the plan; to call us if any questions or concerns in the interim.  # DISPOSITION: # follow up as needed- Dr.B

## 2021-10-06 ENCOUNTER — Inpatient Hospital Stay
Admission: EM | Admit: 2021-10-06 | Discharge: 2021-10-09 | DRG: 872 | Disposition: A | Discharge status: Home / Self Care | Payer: Medicare Other | Attending: Internal Medicine | Admitting: Internal Medicine

## 2021-10-06 ENCOUNTER — Emergency Department: Payer: Medicare Other

## 2021-10-06 ENCOUNTER — Encounter: Payer: Self-pay | Admitting: Emergency Medicine

## 2021-10-06 ENCOUNTER — Other Ambulatory Visit: Payer: Self-pay

## 2021-10-06 DIAGNOSIS — K30 Functional dyspepsia: Secondary | ICD-10-CM | POA: Diagnosis present

## 2021-10-06 DIAGNOSIS — K921 Melena: Secondary | ICD-10-CM

## 2021-10-06 DIAGNOSIS — R5381 Other malaise: Secondary | ICD-10-CM

## 2021-10-06 DIAGNOSIS — Z7901 Long term (current) use of anticoagulants: Secondary | ICD-10-CM

## 2021-10-06 DIAGNOSIS — Y92009 Unspecified place in unspecified non-institutional (private) residence as the place of occurrence of the external cause: Secondary | ICD-10-CM

## 2021-10-06 DIAGNOSIS — R195 Other fecal abnormalities: Secondary | ICD-10-CM | POA: Diagnosis present

## 2021-10-06 DIAGNOSIS — K219 Gastro-esophageal reflux disease without esophagitis: Secondary | ICD-10-CM | POA: Diagnosis present

## 2021-10-06 DIAGNOSIS — I10 Essential (primary) hypertension: Secondary | ICD-10-CM | POA: Diagnosis present

## 2021-10-06 DIAGNOSIS — G8929 Other chronic pain: Secondary | ICD-10-CM | POA: Diagnosis present

## 2021-10-06 DIAGNOSIS — Z79899 Other long term (current) drug therapy: Secondary | ICD-10-CM

## 2021-10-06 DIAGNOSIS — E119 Type 2 diabetes mellitus without complications: Secondary | ICD-10-CM

## 2021-10-06 DIAGNOSIS — R6510 Systemic inflammatory response syndrome (SIRS) of non-infectious origin without acute organ dysfunction: Secondary | ICD-10-CM | POA: Diagnosis present

## 2021-10-06 DIAGNOSIS — B9561 Methicillin susceptible Staphylococcus aureus infection as the cause of diseases classified elsewhere: Secondary | ICD-10-CM | POA: Diagnosis present

## 2021-10-06 DIAGNOSIS — N529 Male erectile dysfunction, unspecified: Secondary | ICD-10-CM | POA: Diagnosis present

## 2021-10-06 DIAGNOSIS — Z981 Arthrodesis status: Secondary | ICD-10-CM

## 2021-10-06 DIAGNOSIS — Z6830 Body mass index (BMI) 30.0-30.9, adult: Secondary | ICD-10-CM

## 2021-10-06 DIAGNOSIS — Z20822 Contact with and (suspected) exposure to covid-19: Secondary | ICD-10-CM | POA: Diagnosis present

## 2021-10-06 DIAGNOSIS — Z86718 Personal history of other venous thrombosis and embolism: Secondary | ICD-10-CM

## 2021-10-06 DIAGNOSIS — E1165 Type 2 diabetes mellitus with hyperglycemia: Secondary | ICD-10-CM | POA: Diagnosis present

## 2021-10-06 DIAGNOSIS — E860 Dehydration: Secondary | ICD-10-CM | POA: Diagnosis present

## 2021-10-06 DIAGNOSIS — Z888 Allergy status to other drugs, medicaments and biological substances status: Secondary | ICD-10-CM

## 2021-10-06 DIAGNOSIS — E876 Hypokalemia: Secondary | ICD-10-CM | POA: Diagnosis present

## 2021-10-06 DIAGNOSIS — M545 Low back pain, unspecified: Secondary | ICD-10-CM | POA: Diagnosis present

## 2021-10-06 DIAGNOSIS — N10 Acute tubulo-interstitial nephritis: Secondary | ICD-10-CM | POA: Diagnosis present

## 2021-10-06 DIAGNOSIS — E669 Obesity, unspecified: Secondary | ICD-10-CM | POA: Diagnosis present

## 2021-10-06 DIAGNOSIS — D638 Anemia in other chronic diseases classified elsewhere: Secondary | ICD-10-CM | POA: Diagnosis present

## 2021-10-06 DIAGNOSIS — N12 Tubulo-interstitial nephritis, not specified as acute or chronic: Secondary | ICD-10-CM | POA: Diagnosis not present

## 2021-10-06 DIAGNOSIS — E785 Hyperlipidemia, unspecified: Secondary | ICD-10-CM | POA: Diagnosis present

## 2021-10-06 DIAGNOSIS — E871 Hypo-osmolality and hyponatremia: Secondary | ICD-10-CM | POA: Diagnosis present

## 2021-10-06 DIAGNOSIS — Z7982 Long term (current) use of aspirin: Secondary | ICD-10-CM

## 2021-10-06 DIAGNOSIS — T476X5A Adverse effect of antidiarrheal drugs, initial encounter: Secondary | ICD-10-CM | POA: Diagnosis present

## 2021-10-06 DIAGNOSIS — E870 Hyperosmolality and hypernatremia: Secondary | ICD-10-CM | POA: Diagnosis present

## 2021-10-06 DIAGNOSIS — A419 Sepsis, unspecified organism: Principal | ICD-10-CM | POA: Diagnosis present

## 2021-10-06 DIAGNOSIS — Z7984 Long term (current) use of oral hypoglycemic drugs: Secondary | ICD-10-CM

## 2021-10-06 DIAGNOSIS — D72829 Elevated white blood cell count, unspecified: Secondary | ICD-10-CM | POA: Diagnosis present

## 2021-10-06 LAB — COMPREHENSIVE METABOLIC PANEL
ALT: 18 U/L (ref 0–44)
AST: 17 U/L (ref 15–41)
Albumin: 2.8 g/dL — ABNORMAL LOW (ref 3.5–5.0)
Alkaline Phosphatase: 62 U/L (ref 38–126)
Anion gap: 9 (ref 5–15)
BUN: 17 mg/dL (ref 8–23)
CO2: 23 mmol/L (ref 22–32)
Calcium: 7.9 mg/dL — ABNORMAL LOW (ref 8.9–10.3)
Chloride: 93 mmol/L — ABNORMAL LOW (ref 98–111)
Creatinine, Ser: 1.04 mg/dL (ref 0.61–1.24)
GFR, Estimated: >60 mL/min (ref >60)
Glucose, Bld: 219 mg/dL — ABNORMAL HIGH (ref 70–99)
Potassium: 3.6 mmol/L (ref 3.5–5.1)
Sodium: 125 mmol/L — ABNORMAL LOW (ref 135–145)
Total Bilirubin: 0.8 mg/dL (ref 0.3–1.2)
Total Protein: 6 g/dL — ABNORMAL LOW (ref 6.5–8.1)

## 2021-10-06 LAB — RESP PANEL BY RT-PCR (FLU A&B, COVID) ARPGX2
Influenza A by PCR: NEGATIVE
Influenza B by PCR: NEGATIVE
SARS Coronavirus 2 by RT PCR: NEGATIVE

## 2021-10-06 LAB — CBC WITH DIFFERENTIAL/PLATELET
Abs Immature Granulocytes: 0.06 10*3/uL (ref 0.00–0.07)
Basophils Absolute: 0 10*3/uL (ref 0.0–0.1)
Basophils Relative: 0 %
Eosinophils Absolute: 0 10*3/uL (ref 0.0–0.5)
Eosinophils Relative: 0 %
HCT: 35.5 % — ABNORMAL LOW (ref 39.0–52.0)
Hemoglobin: 12.3 g/dL — ABNORMAL LOW (ref 13.0–17.0)
Immature Granulocytes: 1 %
Lymphocytes Relative: 4 %
Lymphs Abs: 0.5 10*3/uL — ABNORMAL LOW (ref 0.7–4.0)
MCH: 30.9 pg (ref 26.0–34.0)
MCHC: 34.6 g/dL (ref 30.0–36.0)
MCV: 89.2 fL (ref 80.0–100.0)
Monocytes Absolute: 1.4 10*3/uL — ABNORMAL HIGH (ref 0.1–1.0)
Monocytes Relative: 13 %
Neutro Abs: 9.3 10*3/uL — ABNORMAL HIGH (ref 1.7–7.7)
Neutrophils Relative %: 82 %
Platelets: 194 10*3/uL (ref 150–400)
RBC: 3.98 MIL/uL — ABNORMAL LOW (ref 4.22–5.81)
RDW: 11.5 % (ref 11.5–15.5)
WBC: 11.3 10*3/uL — ABNORMAL HIGH (ref 4.0–10.5)
nRBC: 0 % (ref 0.0–0.2)

## 2021-10-06 LAB — URINALYSIS, ROUTINE W REFLEX MICROSCOPIC
Bilirubin Urine: NEGATIVE
Glucose, UA: 150 mg/dL — AB
Ketones, ur: 5 mg/dL — AB
Nitrite: NEGATIVE
Protein, ur: 100 mg/dL — AB
Specific Gravity, Urine: 1.024 (ref 1.005–1.030)
pH: 5 (ref 5.0–8.0)

## 2021-10-06 LAB — MAGNESIUM: Magnesium: 1.9 mg/dL (ref 1.7–2.4)

## 2021-10-06 LAB — GLUCOSE, CAPILLARY: Glucose-Capillary: 227 mg/dL — ABNORMAL HIGH (ref 70–99)

## 2021-10-06 LAB — LACTIC ACID, PLASMA: Lactic Acid, Venous: 1.2 mmol/L (ref 0.5–1.9)

## 2021-10-06 IMAGING — CT CT RENAL STONE PROTOCOL
2 of 4 series · 14 of 46 positions shown, 16 images · non-contrast
Comparison: None.

CLINICAL DATA: Flank pain, kidney stone suspected



[Series 2: stone full standard · axial · 0.80mm/px · z∈[-1162,-738]mm · 11 of 99 slices shown, 13 images]
[im 9/99  soft-tissue]
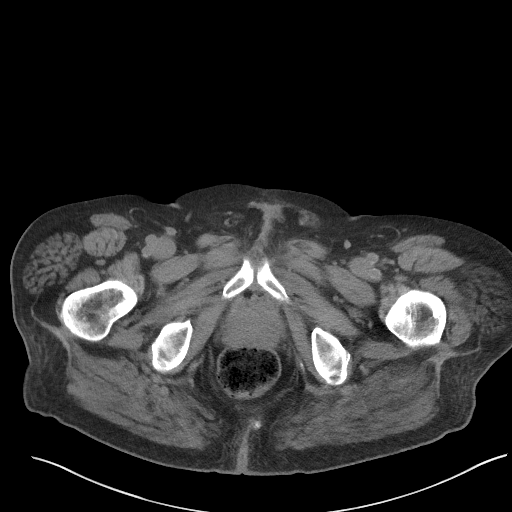
[im 9/99  bone]
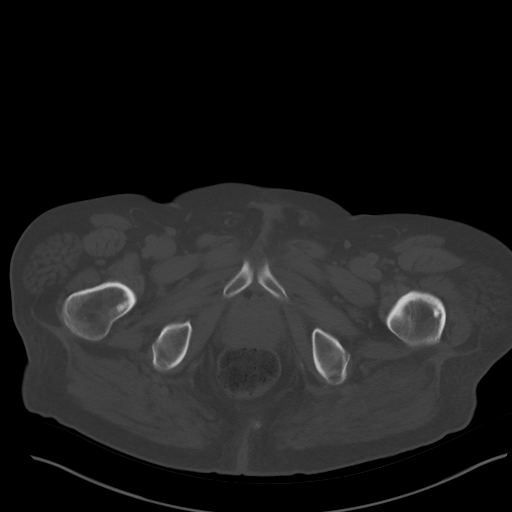
[im 18/99  soft-tissue]
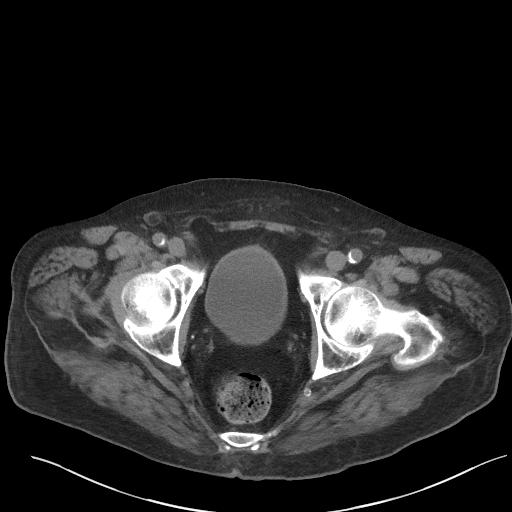
[im 26/99  soft-tissue]
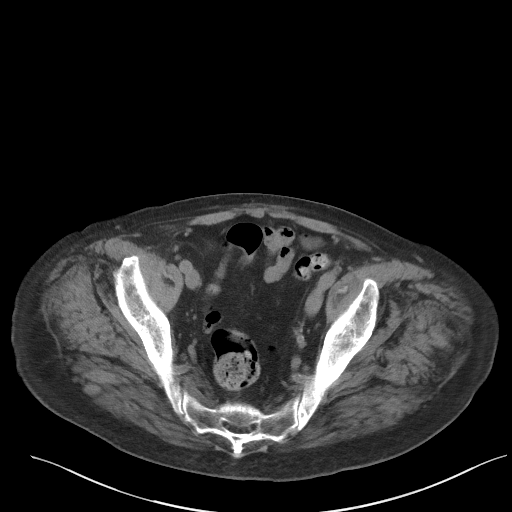
[im 35/99  soft-tissue]
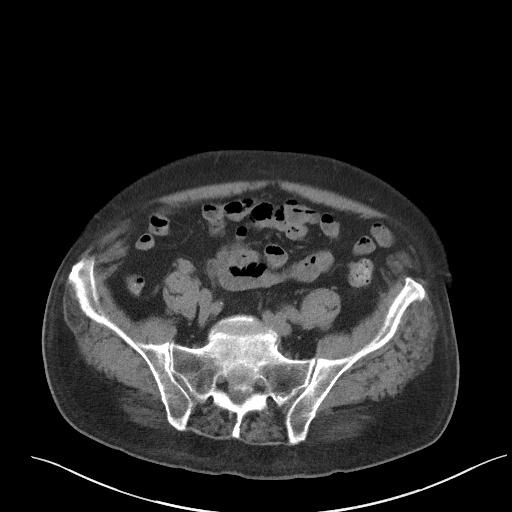
[im 43/99  soft-tissue]
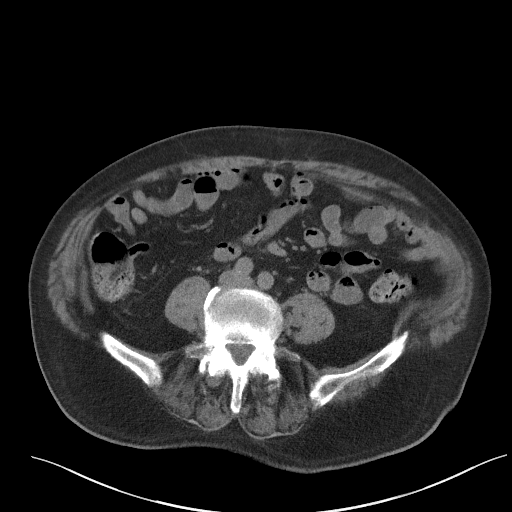
[im 52/99  soft-tissue]
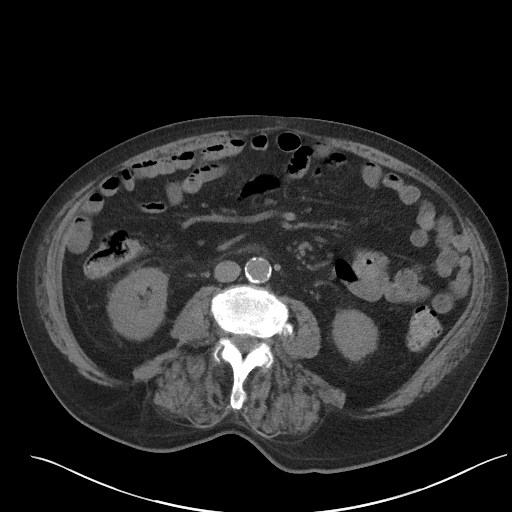
[im 60/99  soft-tissue]
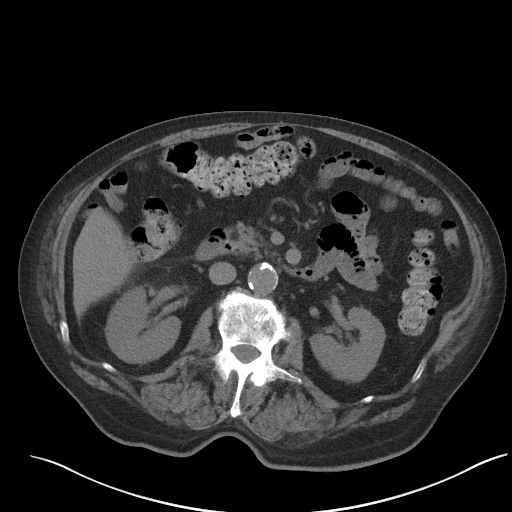
[im 69/99  soft-tissue]
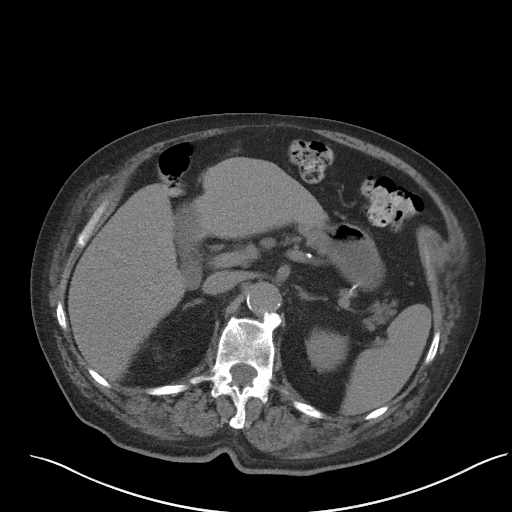
[im 77/99  soft-tissue]
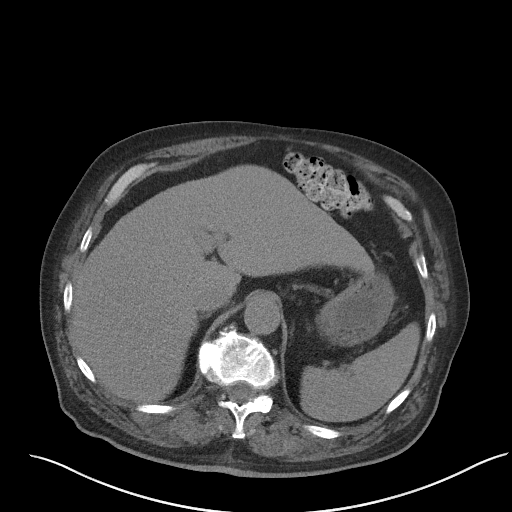
[im 77/99  bone]
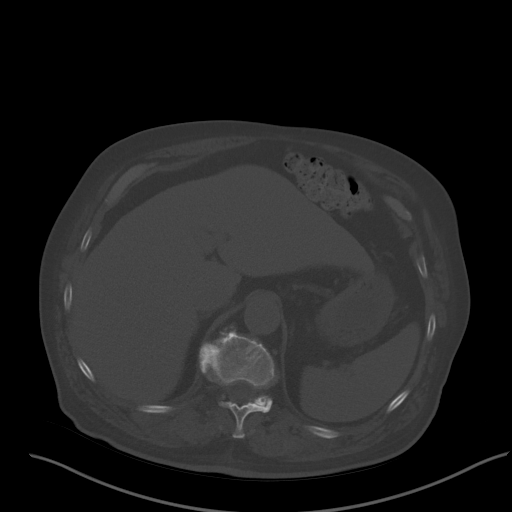
[im 86/99  soft-tissue]
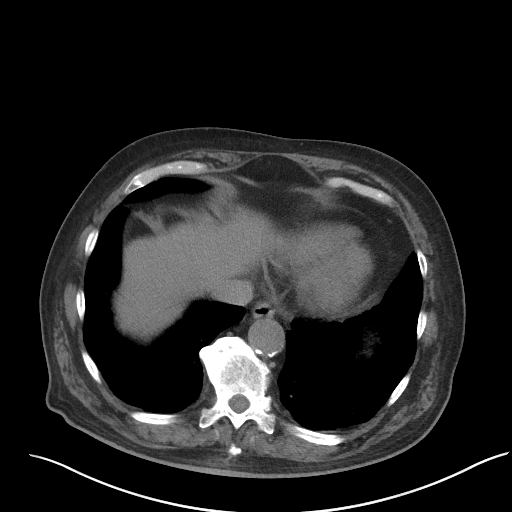
[im 94/99  soft-tissue]
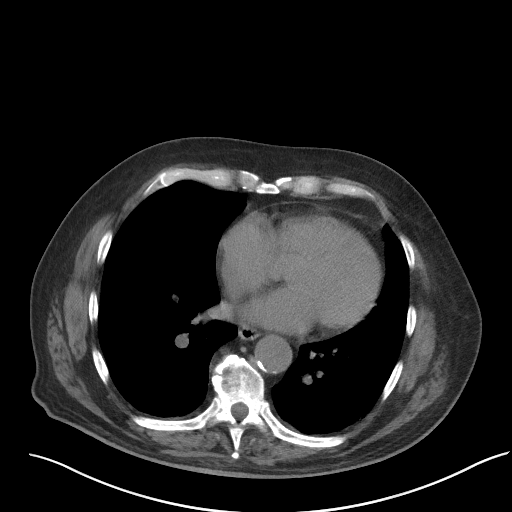

[Series 5: coronal · coronal · 0.70mm/px · 3 of 151 slices shown]
[im 51/151  soft-tissue]
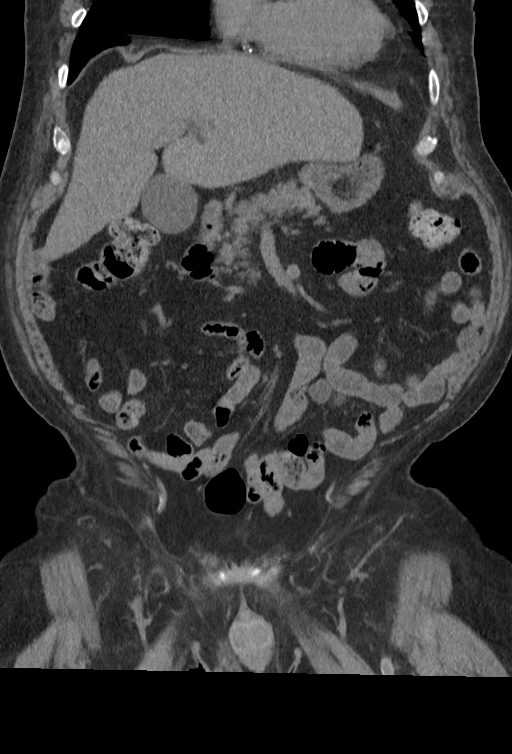
[im 67/151  soft-tissue]
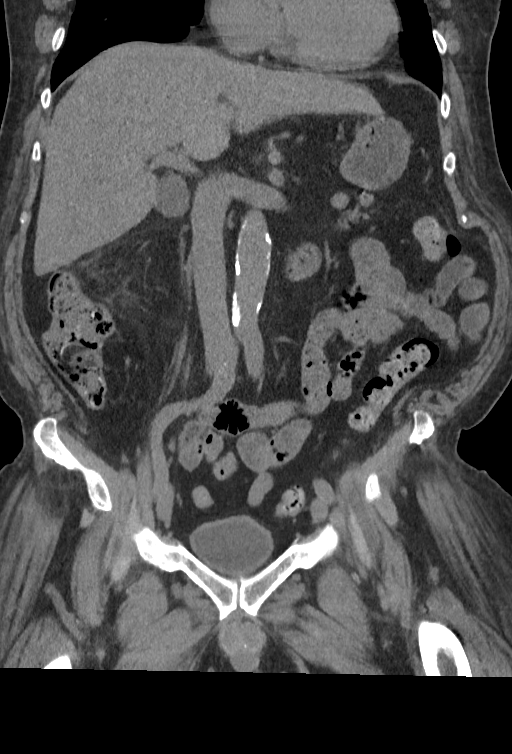
[im 84/151  soft-tissue]
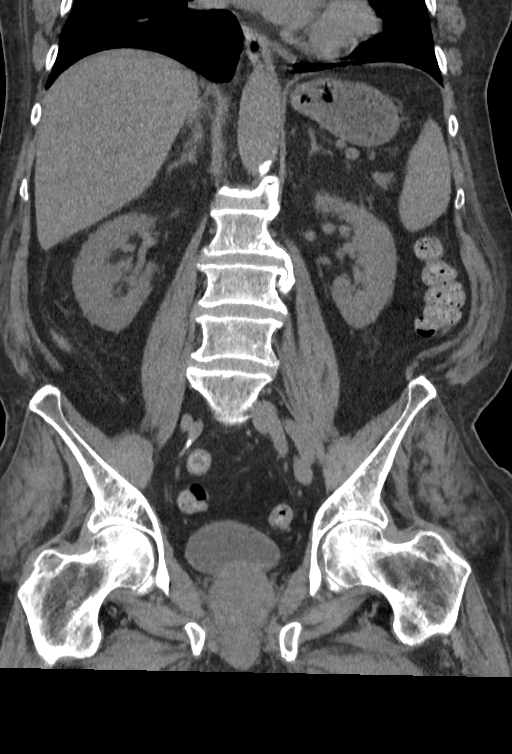

[14 of 46 positions shown; findings below may reference images not displayed]

FINDINGS: Lower chest: There are a few small subpleural nodules within the
bilateral lung bases, largest measuring 6 mm at the posterior aspect
of the left lower lobe (series 4, image 17). Heart size is normal.
Coronary artery atherosclerosis.

Hepatobiliary: Unremarkable unenhanced appearance of the liver. No
focal liver lesion identified. Gallbladder within normal limits. No
hyperdense gallstone. No biliary dilatation.

Pancreas: Unremarkable. No pancreatic ductal dilatation or
surrounding inflammatory changes.

Spleen: Normal in size without focal abnormality.

Adrenals/Urinary Tract: Unremarkable adrenal glands. No evidence of
solid renal lesion, stone, or hydronephrosis. Minimal nonspecific
bilateral perinephric stranding is slightly more pronounced on the
right. Urinary bladder wall appears minimally thickened.

Stomach/Bowel: Stomach is within normal limits. Moderate volume of
stool throughout the colon. No evidence of bowel wall thickening,
distention, or inflammatory changes.

Vascular/Lymphatic: Scattered aortoiliac atherosclerotic
calcifications without aneurysm. No abdominopelvic lymphadenopathy.

Reproductive: Mildly enlarged prostate gland.

Other: No free fluid. No abdominopelvic fluid collection. No
pneumoperitoneum. No abdominal wall hernia.

Musculoskeletal: No acute or significant osseous findings.
Facet-predominant lower lumbar spondylosis.
IMPRESSION: 1. No evidence of obstructive uropathy.
2. Minimal nonspecific bilateral perinephric stranding is slightly
more pronounced on the right. Correlate with urinalysis to exclude
the possibility of ascending urinary tract infection.
3. Urinary bladder wall is minimally thickened which could reflect
changes related to cystitis or outlet obstruction.
4. Mildly enlarged prostate gland.
5. Moderate volume of stool throughout the colon.
6. Multiple small subpleural nodules within the bilateral lung
bases, largest measuring 6 mm at the posterior aspect of the left
lower lobe. Recommend a non-contrast Chest CT at 3-6 months. If
patient is high risk for malignancy, recommend an additional
non-contrast Chest CT at 18-24 months; if patient is low risk for
malignancy a non-contrast Chest CT at 18-24 months is optional.
These guidelines do not apply to immunocompromised patients and
patients with cancer. Follow up in patients with significant
comorbidities as clinically warranted. For lung cancer screening,
adhere to Lung-RADS guidelines. Reference: Radiology. [59];

Aortic Atherosclerosis ([59]-[59]).

## 2021-10-06 IMAGING — CT CT L SPINE W/O CM
3 of 8 series · 13 of 33 positions shown, 15 images · non-contrast
Comparison: None.

CLINICAL DATA: Pain



[Series 504: sagittal · sagittal · 0.71mm/px · 3 of 211 slices shown]
[im 53/211  bone]
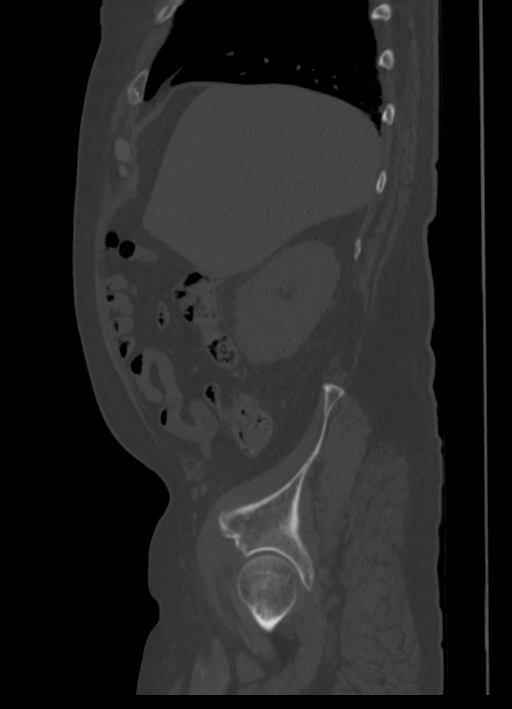
[im 106/211  bone]
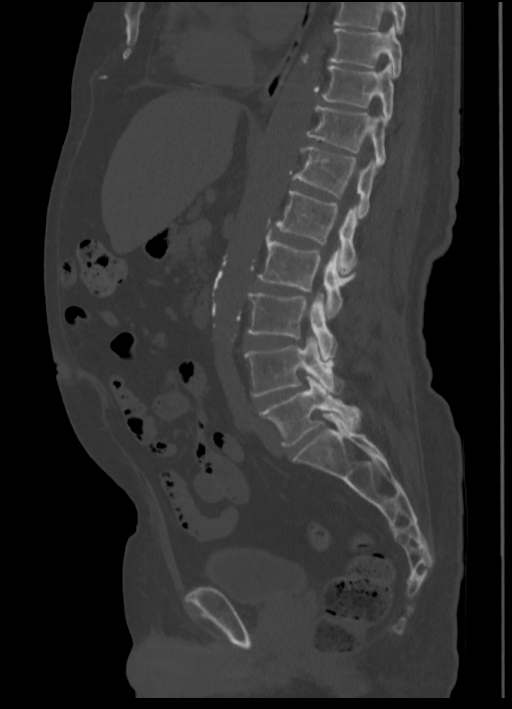
[im 158/211  bone]
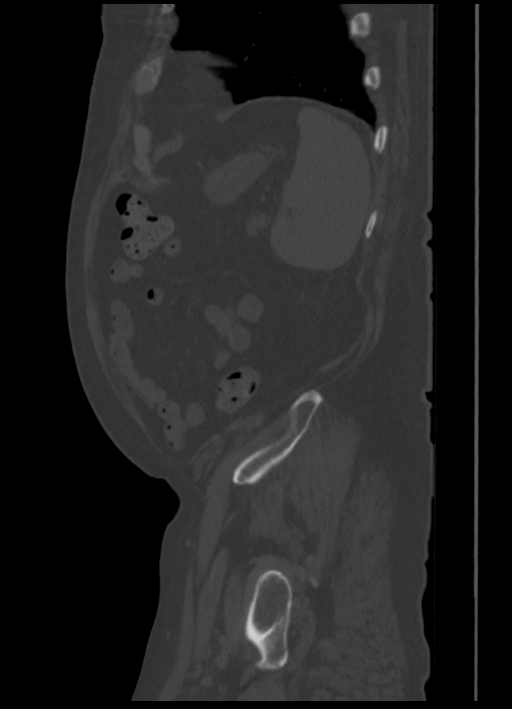

[Series 505: axial · axial · 0.46mm/px · z∈[-1130,-794]mm · 5 of 168 slices shown, 7 images]
[im 28/168  soft-tissue]
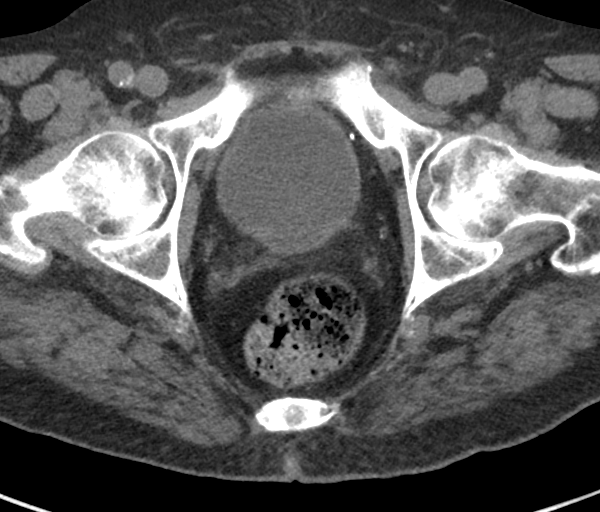
[im 28/168  bone]
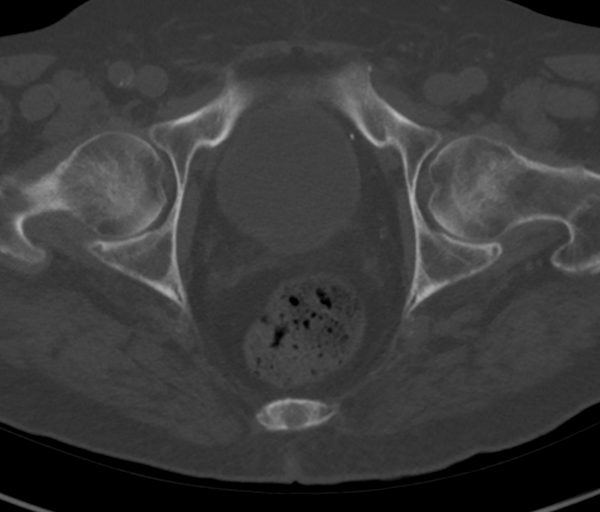
[im 56/168  bone]
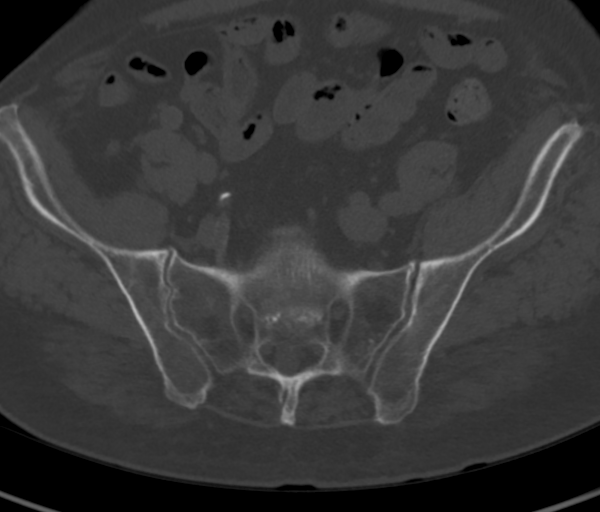
[im 84/168  bone]
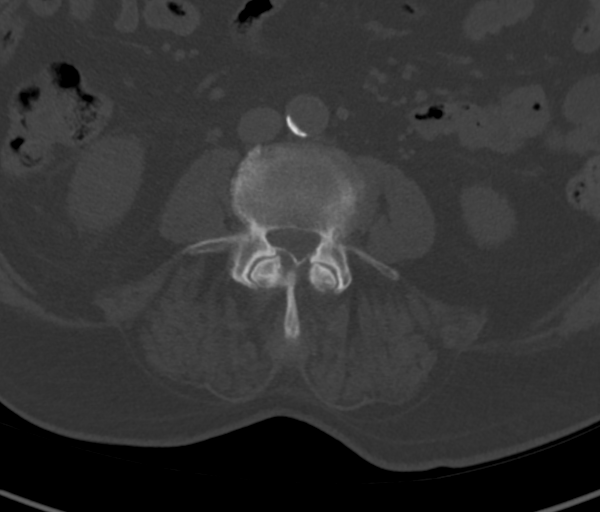
[im 112/168  bone]
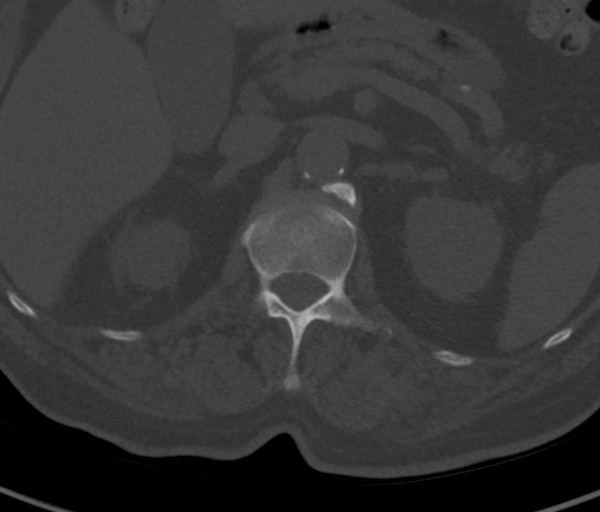
[im 140/168  soft-tissue]
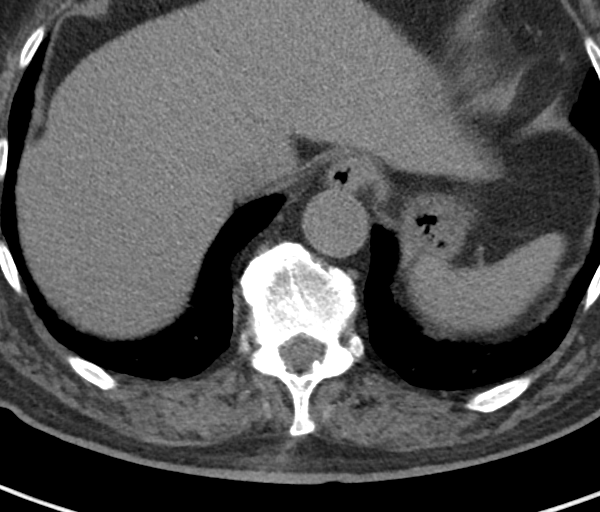
[im 140/168  bone]
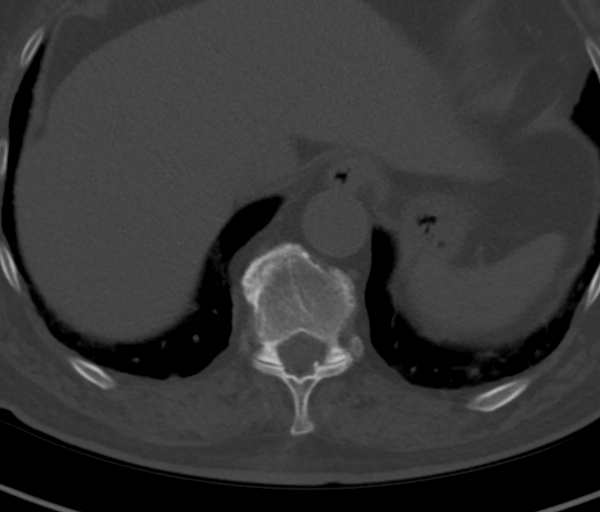

[Series 508: axial bone · axial · 0.51mm/px · z∈[-1130,-794]mm · 5 of 168 slices shown]
[im 28/168  bone]
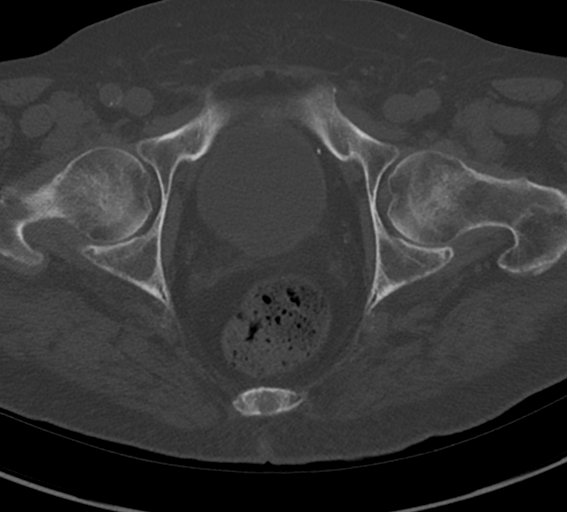
[im 56/168  bone]
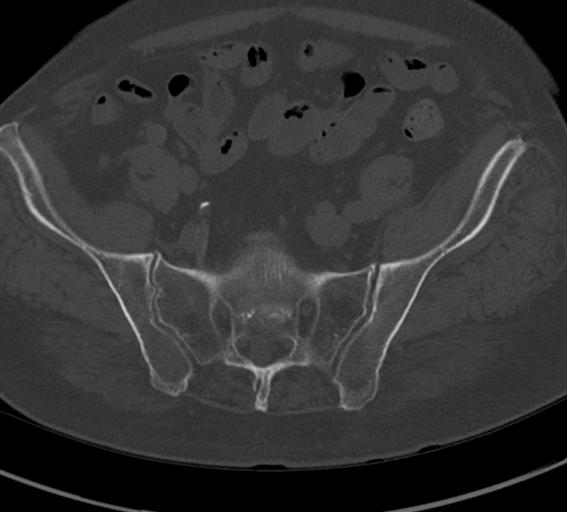
[im 84/168  bone]
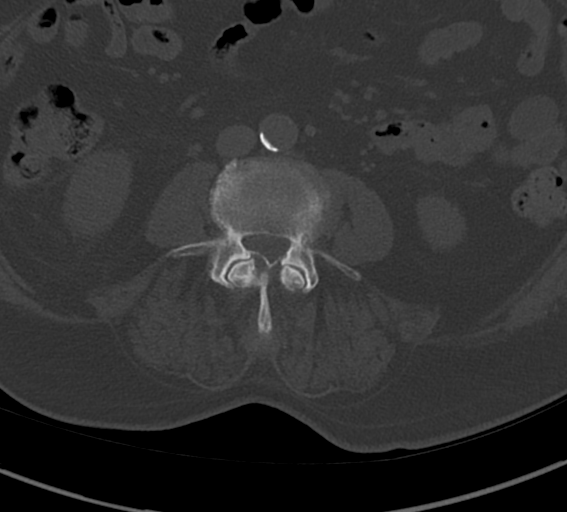
[im 112/168  bone]
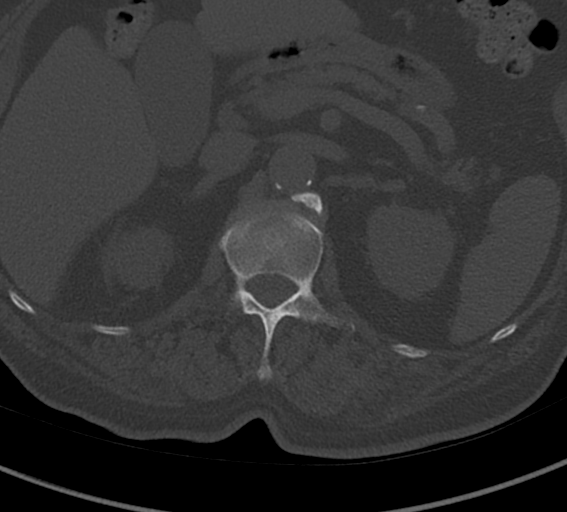
[im 140/168  bone]
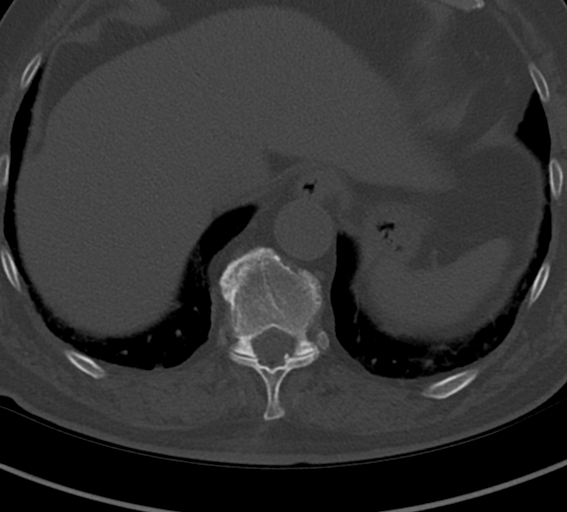

[13 of 33 positions shown; findings below may reference images not displayed]

FINDINGS: Segmentation: 5 lumbar type vertebrae.

Alignment: Trace retrolisthesis of L2 on L3. Minimal grade 1
anterolisthesis of L4 on L5.

Vertebrae: No acute fracture or focal pathologic process.

Paraspinal and other soft tissues: No paraspinal masses or fluid
collections.

Disc levels: Mild intervertebral disc height loss with endplate
spurring most pronounced at L2-3 and L4-5. Severe bilateral facet
arthropathy of L4-5 and L5-S1 with suspected high-grade foraminal
stenosis bilaterally at L4-5. At least moderate canal stenosis is
suspected at L3-4 and L4-5.
IMPRESSION: 1. No acute fracture or traumatic malalignment of the lumbar spine.
2. Multilevel degenerative changes of the lumbar spine, as above.

## 2021-10-06 IMAGING — CR DG LUMBAR SPINE COMPLETE 4+V
5 series · 5 of 5 positions shown · non-contrast
Comparison: None.

CLINICAL DATA: Pt c/o lower back pain that has been going on a
while but got worse on [REDACTED]. Denies strenuous work. Pain worse
with twisting motions now with limited ROM.

EXAM:
LUMBAR SPINE - COMPLETE 4+ VIEW

[l-spine ap]
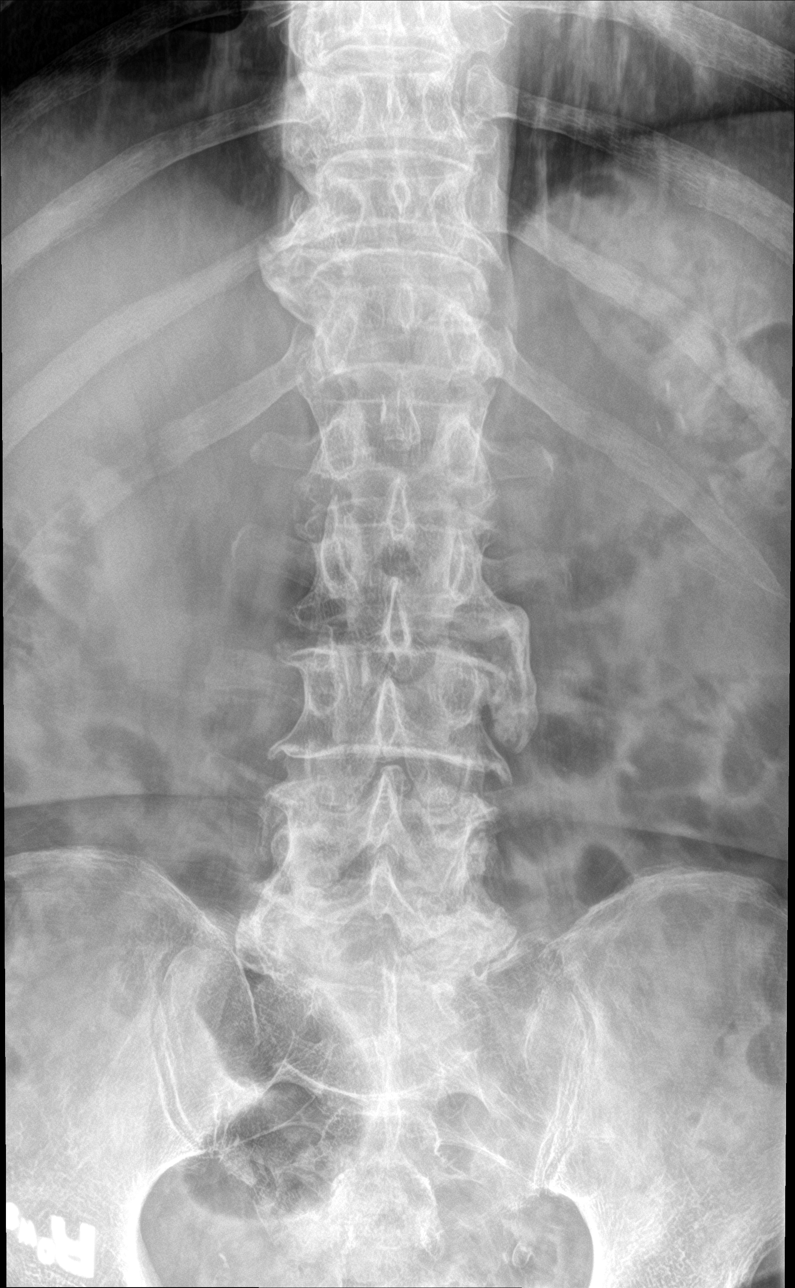

[l-spine obl (1 of 2)]
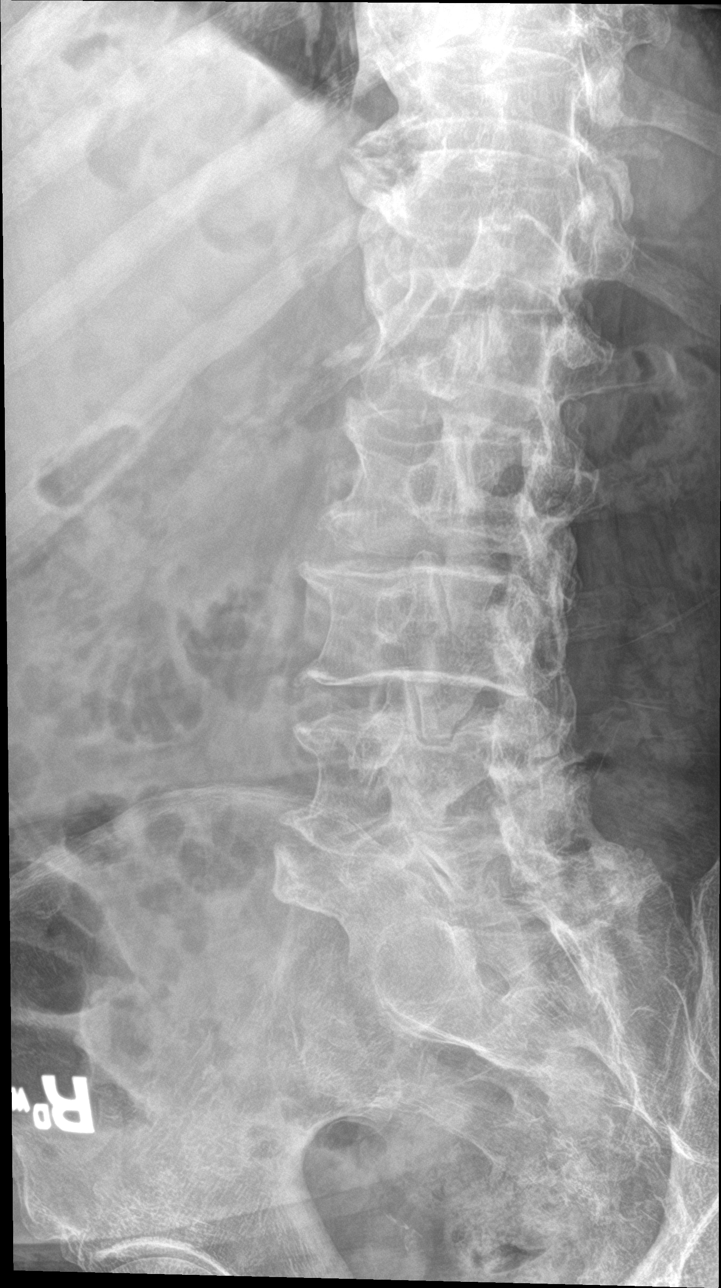

[l-spine obl (2 of 2)]
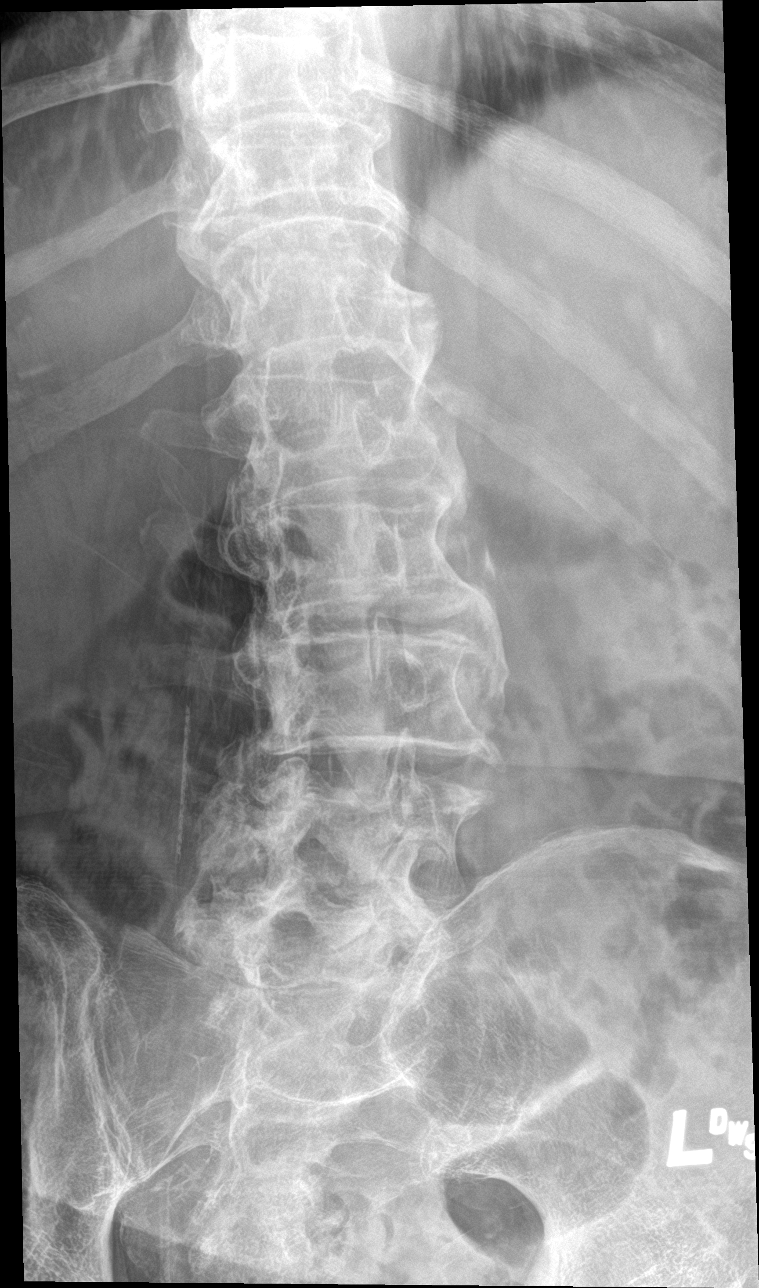

[l-spine lat]
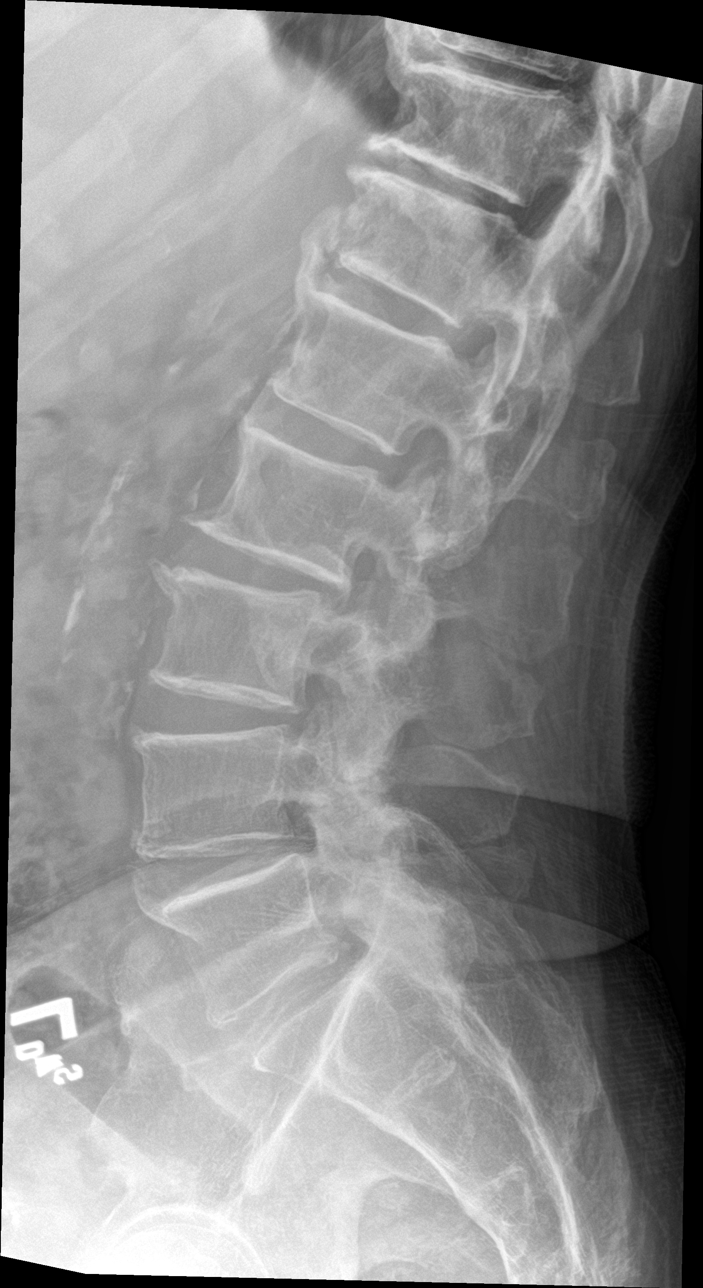

[l-spine spot]
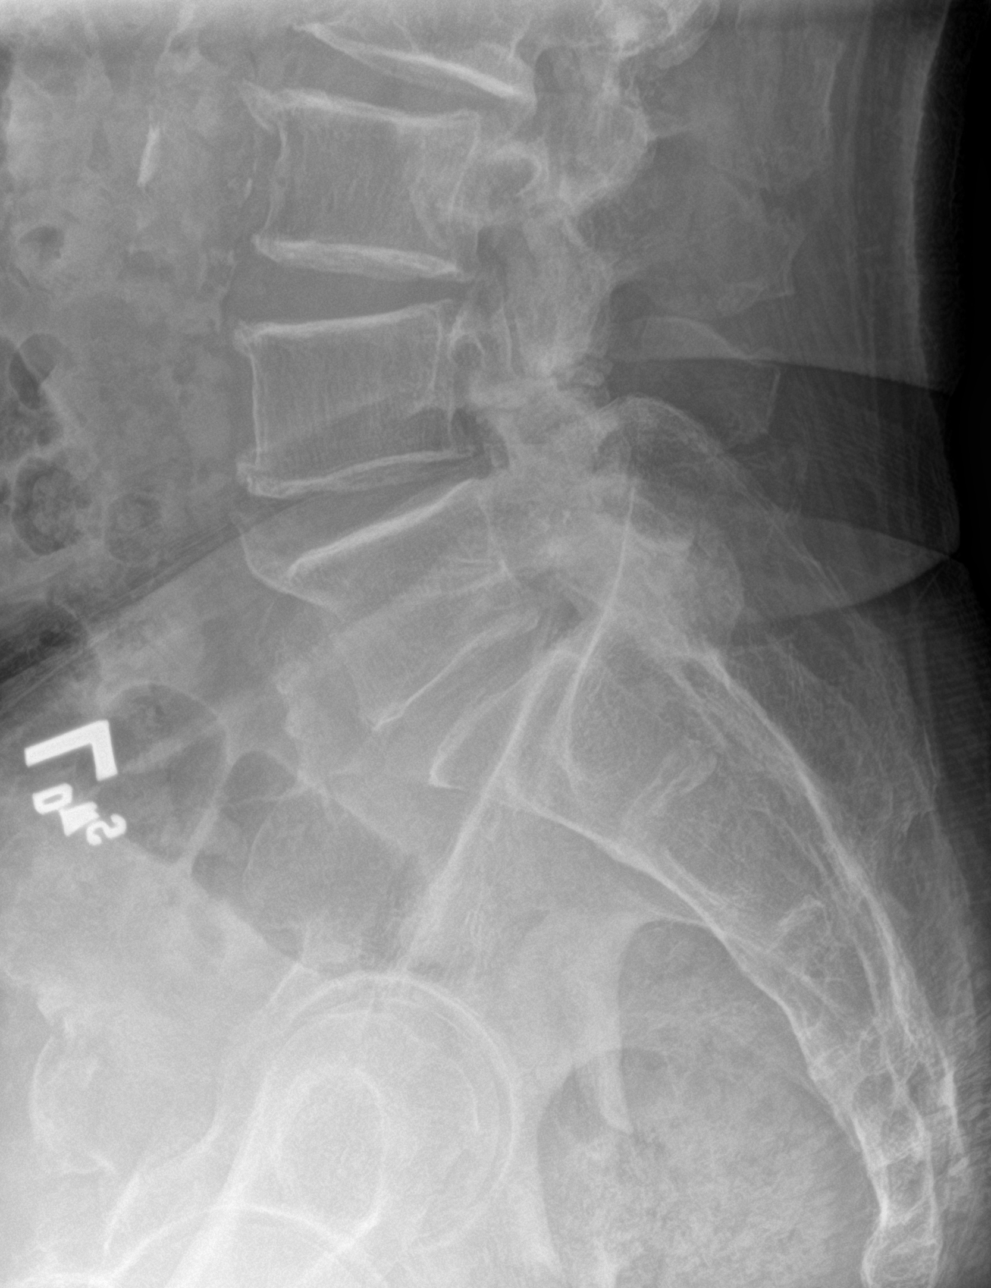

[5 of 5 positions shown; findings below may reference images not displayed]

FINDINGS: No fracture, bone lesion or spondylolisthesis.

Mild loss of disc height at L4-L5. Minimal loss of disc height at
L2-L3. Remaining disc spaces are well preserved. There are anterior
endplate osteophytes throughout the visualized spine.

Mild facet degenerative changes at L4-L5 and L5-S1, greatest on the
right at L4-L5.

Scattered atherosclerotic calcifications in the abdominal aorta.
Soft tissues otherwise unremarkable.
IMPRESSION: 1. No fracture or acute finding.
2. Mild degenerative changes as detailed above.

## 2021-10-06 MED ORDER — ROSUVASTATIN CALCIUM 5 MG PO TABS
5.0000 mg | ORAL_TABLET | Freq: Every day | ORAL | Status: DC
  Administered 2021-10-07 – 2021-10-08 (×2): 5 mg via ORAL
  Filled 2021-10-06 (×3): qty 1

## 2021-10-06 MED ORDER — TRAMADOL HCL 50 MG PO TABS
50.0000 mg | ORAL_TABLET | Freq: Once | ORAL | Status: AC
  Administered 2021-10-06: 50 mg via ORAL
  Filled 2021-10-06: qty 1

## 2021-10-06 MED ORDER — SODIUM CHLORIDE 0.9 % IV BOLUS (SEPSIS): 1000.0000 mL | Freq: Once | INTRAVENOUS | Status: DC

## 2021-10-06 MED ORDER — ONDANSETRON HCL 4 MG/2ML IJ SOLN: 4.0000 mg | Freq: Four times a day (QID) | INTRAMUSCULAR | Status: DC | PRN

## 2021-10-06 MED ORDER — ACETAMINOPHEN 650 MG RE SUPP: 650.0000 mg | Freq: Four times a day (QID) | RECTAL | Status: DC | PRN

## 2021-10-06 MED ORDER — SODIUM CHLORIDE 0.9 % IV SOLN
2.0000 g | INTRAVENOUS | Status: DC
  Administered 2021-10-07 – 2021-10-08 (×2): 2 g via INTRAVENOUS
  Filled 2021-10-06 (×2): qty 2
  Filled 2021-10-06: qty 20

## 2021-10-06 MED ORDER — ACETAMINOPHEN 325 MG PO TABS
650.0000 mg | ORAL_TABLET | Freq: Four times a day (QID) | ORAL | Status: DC | PRN
  Administered 2021-10-06 – 2021-10-07 (×4): 650 mg via ORAL
  Filled 2021-10-06 (×3): qty 2

## 2021-10-06 MED ORDER — SODIUM CHLORIDE 0.9 % IV BOLUS
1000.0000 mL | Freq: Once | INTRAVENOUS | Status: AC
  Administered 2021-10-06: 1000 mL via INTRAVENOUS

## 2021-10-06 MED ORDER — ENOXAPARIN SODIUM 40 MG/0.4ML IJ SOSY
40.0000 mg | PREFILLED_SYRINGE | Freq: Every day | INTRAMUSCULAR | Status: DC
  Administered 2021-10-06 – 2021-10-07 (×2): 40 mg via SUBCUTANEOUS
  Filled 2021-10-06 (×2): qty 0.4

## 2021-10-06 MED ORDER — SODIUM CHLORIDE 0.9 % IV SOLN
1.0000 g | Freq: Once | INTRAVENOUS | Status: AC
  Administered 2021-10-06: 1 g via INTRAVENOUS
  Filled 2021-10-06: qty 10

## 2021-10-06 MED ORDER — HYDRALAZINE HCL 10 MG PO TABS
10.0000 mg | ORAL_TABLET | Freq: Four times a day (QID) | ORAL | Status: DC | PRN
  Filled 2021-10-06: qty 1

## 2021-10-06 MED ORDER — KETOROLAC TROMETHAMINE 30 MG/ML IJ SOLN
15.0000 mg | Freq: Three times a day (TID) | INTRAMUSCULAR | Status: DC | PRN
  Administered 2021-10-06 – 2021-10-07 (×2): 15 mg via INTRAVENOUS
  Filled 2021-10-06 (×2): qty 1

## 2021-10-06 MED ORDER — VITAMIN D 25 MCG (1000 UNIT) PO TABS
1000.0000 [IU] | ORAL_TABLET | Freq: Every day | ORAL | Status: DC
  Administered 2021-10-07 – 2021-10-09 (×3): 1000 [IU] via ORAL
  Filled 2021-10-06 (×3): qty 1

## 2021-10-06 MED ORDER — SODIUM CHLORIDE 0.9 % IV BOLUS (SEPSIS)
1000.0000 mL | Freq: Once | INTRAVENOUS | Status: AC
  Administered 2021-10-06: 1000 mL via INTRAVENOUS

## 2021-10-06 MED ORDER — MORPHINE SULFATE (PF) 2 MG/ML IV SOLN
1.0000 mg | INTRAVENOUS | Status: DC | PRN
  Administered 2021-10-07: 04:00:00 1 mg via INTRAVENOUS
  Filled 2021-10-06: qty 1

## 2021-10-06 MED ORDER — INSULIN ASPART 100 UNIT/ML IJ SOLN
0.0000 [IU] | Freq: Three times a day (TID) | INTRAMUSCULAR | Status: DC
  Administered 2021-10-07: 5 [IU] via SUBCUTANEOUS
  Administered 2021-10-07: 3 [IU] via SUBCUTANEOUS
  Administered 2021-10-07: 18:00:00 2 [IU] via SUBCUTANEOUS
  Administered 2021-10-08 (×2): 3 [IU] via SUBCUTANEOUS
  Administered 2021-10-08: 8 [IU] via SUBCUTANEOUS
  Administered 2021-10-09: 09:00:00 3 [IU] via SUBCUTANEOUS
  Filled 2021-10-06 (×7): qty 1

## 2021-10-06 MED ORDER — ACETAMINOPHEN 325 MG PO TABS
650.0000 mg | ORAL_TABLET | Freq: Once | ORAL | Status: AC
  Administered 2021-10-06: 650 mg via ORAL
  Filled 2021-10-06: qty 2

## 2021-10-06 MED ORDER — ONDANSETRON HCL 4 MG PO TABS: 4.0000 mg | ORAL_TABLET | Freq: Four times a day (QID) | ORAL | Status: DC | PRN

## 2021-10-06 MED ORDER — SODIUM CHLORIDE 0.9 % IV BOLUS (SEPSIS)
1000.0000 mL | Freq: Once | INTRAVENOUS | Status: AC
  Administered 2021-10-06: 21:00:00 1000 mL via INTRAVENOUS

## 2021-10-06 MED ORDER — INSULIN ASPART 100 UNIT/ML IJ SOLN
0.0000 [IU] | Freq: Every day | INTRAMUSCULAR | Status: DC
  Administered 2021-10-06: 21:00:00 2 [IU] via SUBCUTANEOUS
  Filled 2021-10-06: qty 1

## 2021-10-06 NOTE — Assessment & Plan Note (Signed)
-   Non-insulin-dependent diabetes mellitus - He takes glipizide-metformin 2.5-500 mg p.o. twice daily, this has not been resumed - Insulin SSI with at bedtime coverage ordered - Goal inpatient blood glucose is 140-180

## 2021-10-06 NOTE — ED Provider Notes (Signed)
Assuming care from [facility], PA-C.  Plan at this time is to evaluate urine and flank pain.  X-ray of the lumbar spine did not show any acute abnormalities.  I will order CT renal stone study along with no charge L-spine Physical Exam  BP 107/63 (BP Location: Right Arm)    Pulse 92    Temp (!) 101.4 F (38.6 C) (Oral) Comment: ronnie, pa notified.   Resp 18    Ht 5\' 11"  (1.803 m)    Wt 99.8 kg    SpO2 98%    BMI 30.68 kg/m   Physical Exam Patient has no abdominal tenderness, bowel sounds normal all 4 quadrants, lungs clear to auscultation, heart rate is normal  Procedures  Procedures  ED Course / MDM    Medical Decision Making Amount and/or Complexity of Data Reviewed Labs: ordered. Radiology: ordered.  Risk OTC drugs. Prescription drug management. Decision regarding hospitalization.  UA shows haziness in appearance, hemoglobin, protein and a trace of leuks  CT renal stone study shows perinephric stranding bilaterally, due to the patient's fever along with flank pain and stranding most likely has pyelonephritis.  We will start sepsis protocols due to the fever and pyelonephritis.  Fluids and Rocephin ordered  Patient's labs show an elevated WBC of 11.3, comprehensive metabolic panel shows elevated glucose and decreased sodium, hopefully the sodium will be corrected with the normal saline.  Lactic acid is normal for the patient is not septic at this time.  Urine culture was ordered  Consult to hospitalist Spoke with Dr. Tobie Poet, she will be admitting the patient for pyelonephritis.  I did inform her that Rocephin had already been started.  Respiratory panel is negative for COVID and influenza. Patient was notified.       Versie Starks, PA-C 10/06/21 Verdis Frederickson, MD 10/06/21 [phone]

## 2021-10-06 NOTE — Assessment & Plan Note (Addendum)
-   Completed anticoagulation and currently no longer on anticoagulation

## 2021-10-06 NOTE — ED Triage Notes (Signed)
Pt via POV from home. Pt c/o lower back pain that has been going on a while but got worse on Wednesday. Denies strenuous work. Pt still does work 40 hours a week. Pt is A&OX4 and NAD

## 2021-10-06 NOTE — Assessment & Plan Note (Addendum)
-   Ceftriaxone 2 g IV daily - Urine culture ordered, added to prior collection

## 2021-10-06 NOTE — ED Notes (Signed)
Request made for transport to the floor ?

## 2021-10-06 NOTE — Assessment & Plan Note (Addendum)
-   Presumed secondary to pyelonephritis - Status post ceftriaxone 1 g IV and sodium chloride 1 L bolus per EDP - Added additional ceftriaxone 1 mg to total 2 g on day of admission; continue with ceftriaxone 2 g IV starting on 10/07/2021 - Urine culture ordered and sepsis bolus ordered - Admit to MedSurg, observation, no telemetry

## 2021-10-06 NOTE — ED Notes (Signed)
ED Provider at bedside. 

## 2021-10-06 NOTE — Hospital Course (Signed)
Douglas Lyons is a 76 year old male with history of hypertension, hyperlipidemia, history of DVT, completed anticoagulation course, GERD, erectile dysfunction, non-insulin-dependent diabetes mellitus, mild obesity, who presents emergency department for chief concerns of low back pain.  Vitals in the emergency department show Tmax of 101.4, respiration rate of 16, heart rate of 136/82, SPO2 of 98% on room air, heart rate of 78.  Labs in the emergency department showed serum sodium 125, potassium 3.6, chloride 93, bicarb 23, BUN is 17, serum creatinine of 1.24, nonfasting blood glucose 219, EGFR greater than 60, WBC 11.3, hemoglobin 12.3, platelets of 194.  Lactic acid was 1.2.  COVID/influenza A/influenza B PCR were negative.  In the emergency department ED treatment: Acetaminophen 650 mg p.o. one-time dose, tramadol 50 mg p.o. one-time dose, ceftriaxone 1 g IV once, sodium chloride 1 L bolus.

## 2021-10-06 NOTE — ED Provider Notes (Signed)
[facility] Provider Note    Event Date/Time   First MD Initiated Contact with Patient 10/06/21 1450     (approximate)   History   Back Pain   HPI  Douglas Lyons is a 76 y.o. male patient complain of 3 days of increasing low back pain.  Patient has a history of chronic back pain but he stated pain is worse in the past 3 days.  Patient continues to work 40 hours a week but states the job is nondistended for [place].  Denies radicular component to pain.  Patient does admit to left flank pain radiating to the left abdomen area.  Denies chest pain or dyspnea.  Denies urinary complaint.  Rates pain as 8/10.  Described pain as "achy".  No palliative measure for complaint.     Physical Exam   Triage Vital Signs: ED Triage Vitals  Enc Vitals Group     BP 10/06/21 1320 136/82     Pulse Rate 10/06/21 1320 78     Resp 10/06/21 1320 16     Temp 10/06/21 1320 98.4 F (36.9 C)     Temp Source 10/06/21 1320 Oral     SpO2 10/06/21 1320 98 %     Weight 10/06/21 1321 220 lb (99.8 kg)     Height 10/06/21 1321 5\' 11"  (1.803 m)     Head Circumference --      Peak Flow --      Pain Score 10/06/21 1322 8     Pain Loc --      Pain Edu? --      Excl. in [place]? --     Most recent vital signs: Vitals:   10/06/21 1320  BP: 136/82  Pulse: 78  Resp: 16  Temp: 98.4 F (36.9 C)  SpO2: 98%    General: Awake, no distress.  CV:  Good peripheral perfusion.  Resp:  Normal effort.  Abd:  No distention.  Other:  Acute back pain.   ED Results / Procedures / Treatments   Labs (all labs ordered are listed, but only abnormal results are displayed) Labs Reviewed  URINALYSIS, ROUTINE W REFLEX MICROSCOPIC     EKG     RADIOLOGY Modest  changes of the lumbar spine. Radiology confirmed no acute findings with mild degenerative changes.        PROCEDURES:  Critical Care performed: No  Procedures   MEDICATIONS ORDERED IN ED: Medications - No data to  display Tylenol was given a 650 mg secondary to temperature increased from 99.8-101.4.  Patient given tramadol 50 mg for pain control.  IMPRESSION / MDM / ASSESSMENT AND PLAN / ED COURSE  I reviewed the triage vital signs and the nursing notes.                              Differential diagnosis includes, but is not limited to, lumbar strain, DJD, kidney stone, no H&P. Further evaluation with urinalysis and lumbar spine x-ray. Ashok Cordia with assumed care of patient.      FINAL CLINICAL IMPRESSION(S) / ED DIAGNOSES   Final diagnoses:  None     Rx / DC Orders   ED Discharge Orders     None        Note:  This document was prepared using Dragon voice recognition software and may include unintentional dictation errors.    Sable Feil, PA-C 10/06/21 1613    Charna Archer,  Juanda Crumble, MD 10/06/21 [phone]

## 2021-10-06 NOTE — ED Notes (Signed)
Patient called this nurse to the room. Patient c/o pain but only has Tylenol ordered and it isnt time to give that again until 2215. Notified provider for orders. Dr. Tobie Poet order Toradol and Morphine and requested that Toradol be given first then Morphine if needed. Unable to give Toradol before transport arrived because it has not been verified by pharmacy. Notation has been given to transport to give to nurse on the floor.

## 2021-10-06 NOTE — Plan of Care (Signed)
Care plan initiated.

## 2021-10-06 NOTE — H&P (Addendum)
History and Physical   Douglas Lyons QXI:[id number] DOB: 08/21/1946 DOA: 10/06/2021  PCP: Sallee Lange, NP  Outpatient Specialists: Dr. Posey Pronto, orthopedic surgery Patient coming from: home via [place]  I have personally briefly reviewed patient's old medical records in [place].  Chief Concern: Low back pain  HPI: Douglas Lyons is a 76 year old male with history of hypertension, hyperlipidemia, history of DVT, completed anticoagulation course, GERD, erectile dysfunction, non-insulin-dependent diabetes mellitus, mild obesity, who presents emergency department for chief concerns of low back pain.  Vitals in the emergency department show Tmax of 101.4, respiration rate of 16, heart rate of 136/82, SPO2 of 98% on room air, heart rate of 78.  Labs in the emergency department showed serum sodium 125, potassium 3.6, chloride 93, bicarb 23, BUN is 17, serum creatinine of 1.24, nonfasting blood glucose 219, EGFR greater than 60, WBC 11.3, hemoglobin 12.3, platelets of 194.  Lactic acid was 1.2.  COVID/influenza A/influenza B PCR were negative.  In the emergency department ED treatment: Acetaminophen 650 mg p.o. one-time dose, tramadol 50 mg p.o. one-time dose, ceftriaxone 1 g IV once, sodium chloride 1 L bolus.  At bedside patient is able to tell me his name, age, and his location of hospital.  He reports that on Tuesday he started having some nausea and generalized weakness.  He attributed this to getting blood.  He developed worsening acute on chronic back pain on Wednesday left worse than the right.  He denies any fever at home.  He also endorses dysuria and denies hematuria.  He denies known passing of stones in the last week.  He thought he may have passed a stone about 5 to 6 weeks ago however he is not certain.  He denies diarrhea, swelling in his legs, syncope, loss of consciousness, dysphagia, chest pain, shortness of breath, abdominal pain.  He denies any recent urologic  intervention or surgery in the last couple of months.  He states that the last time he saw a urologist quit about 20 years ago also for UTI.  Social history: He lives at home with his wife.  He denies history of tobacco use or recreational drug use.  He endorses infrequent EtOH use.  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  ROS: Constitutional: no weight change, no fever ENT/Mouth: no sore throat, no rhinorrhea Eyes: no eye pain, no vision changes Cardiovascular: no chest pain, no dyspnea,  no edema, no palpitations Respiratory: no cough, no sputum, no wheezing Gastrointestinal: + nausea, no vomiting, no diarrhea, no constipation Genitourinary: no urinary incontinence, + dysuria, no hematuria Musculoskeletal: no arthralgias, + myalgias Skin: no skin lesions, no pruritus, Neuro: + weakness, no loss of consciousness, no syncope Psych: no anxiety, no depression, + decrease appetite Heme/Lymph: no bruising, no bleeding  ED Course: Discussed with emergency medicine provider, patient required hospitalization for chief concerns of pyelonephritis.  Assessment/Plan  Principal Problem:   Pyelonephritis Active Problems:   Diabetes mellitus, type 2 (HCC)   Hypertension   GERD (gastroesophageal reflux disease)   Erectile dysfunction   Hyperlipidemia   Systemic inflammatory response syndrome (SIRS) without organ dysfunction (HCC)   History of DVT (deep vein thrombosis)   Hyperosmolar hyponatremia   * Pyelonephritis Assessment & Plan - Ceftriaxone 2 g IV daily - Urine culture ordered, added to prior collection  Hypertension Assessment & Plan - Patient takes enalapril 5 mg daily at home, this has not been resumed due to patient presenting with low normotensive -  A.m. team to resume when appropriate - Hydralazine 10 mg p.o. as needed every 6 hours for SBP greater than 170, 4 doses ordered  Diabetes mellitus, type 2 (HCC) Assessment & Plan - Non-insulin-dependent  diabetes mellitus - He takes glipizide-metformin 2.5-500 mg p.o. twice daily, this has not been resumed - Insulin SSI with at bedtime coverage ordered - Goal inpatient blood glucose is 140-180  Hyperosmolar hyponatremia Assessment & Plan - Mild asymptomatic - Presumed secondary to hyperglycemia - Corrected serum sodium level is 128, [place] 1999. - Sepsis bolus with sodium chloride - BMP in the a.m.  History of DVT (deep vein thrombosis) Assessment & Plan - Completed anticoagulation and currently no longer on anticoagulation  Systemic inflammatory response syndrome (SIRS) without organ dysfunction (HCC) Assessment & Plan - Clinically patient does not appear to be septic at this time and with no elevation of lactic acid, I did not order blood cultures - Presumed secondary to pyelonephritis - Status post ceftriaxone 1 g IV and sodium chloride 1 L bolus per EDP - Added additional ceftriaxone 1 mg to total 2 g on day of admission; continue with ceftriaxone 2 g IV starting on 10/07/2021 - Urine culture ordered and sepsis bolus ordered - Admit to MedSurg, observation, no telemetry  Hyperlipidemia Assessment & Plan - Rosuvastatin 20 mg nightly resumed  Chart reviewed.   DVT prophylaxis: Enoxaparin Code Status: Full code Diet: Heart healthy/carb modified Family Communication: Spouse was at bedside at my initial introduction however she left Disposition Plan: Pending clinical course, anticipate less than 2 night stay Consults called: None at this time Admission status: MedSurg, observation, telemetry  Past Medical History:  Diagnosis Date   Cataracts, bilateral    Degenerative joint disease of knee, left    Diabetes mellitus, type 2 ([place])    Diverticulosis    Erectile dysfunction    GERD (gastroesophageal reflux disease)    Heme positive stool    Hyperlipidemia    Hypertension    Lumbar disc herniation    Overweight (BMI 25.0-29.9)    Past Surgical History:  Procedure  Laterality Date   ANTERIOR CERVICAL DECOMP/DISCECTOMY FUSION N/A 12/01/2020   Procedure: ANTERIOR CERVICAL DECOMPRESSION/DISCECTOMY FUSION 2 LEVELS C3-4, C4-5;  Surgeon: Deetta Perla, MD;  Location: [facility] ORS;  Service: Neurosurgery;  Laterality: N/A;   COLONOSCOPY     COLONOSCOPY N/A 07/26/2015   Procedure: COLONOSCOPY;  Surgeon: Manya Silvas, MD;  Location: [facility] ENDOSCOPY;  Service: Endoscopy;  Laterality: N/A;   COLONOSCOPY N/A 09/29/2020   Procedure: COLONOSCOPY;  Surgeon: [name], MD;  Location: [facility] ENDOSCOPY;  Service: Endoscopy;  Laterality: N/A;   SHOULDER ARTHROSCOPY Right    VASECTOMY     Social History:  reports that he has never smoked. He has never used smokeless tobacco. He reports current alcohol use. He reports that he does not currently use drugs.  Allergies  Allergen Reactions   Rosuvastatin Other (See Comments)    Other reaction(s): Muscle Pain Is taking every other day and he feels much better as of 12/30/2016 Is taking every other day and he feels much better as of 12/30/2016  Is taking every other day and he feels much better as of 12/30/2016 Other reaction(s): Muscle Pain Is taking every other day and he feels much better as of 12/30/2016 Is taking every other day and he feels much better as of 12/30/2016   Family History  Problem Relation Age of Onset   Cancer Father    Family history: Family history reviewed  and not pertinent.  Prior to Admission medications   Medication Sig Start Date End Date Taking? Authorizing Provider  acetaminophen (TYLENOL) 500 MG tablet Take by mouth.    [provider]  acetaminophen (TYLENOL) 650 MG CR tablet Take 650 mg by mouth in the morning and at bedtime.    [provider]  apixaban (ELIQUIS) 5 MG TABS tablet Take by mouth. Patient not taking: Reported on 05/01/2021 12/19/20   [provider]  aspirin EC 81 MG tablet Take 81 mg by mouth daily. Swallow whole.    [provider]   cholecalciferol (VITAMIN D3) 25 MCG (1000 UNIT) tablet Take 1,000 Units by mouth daily.    [provider]  enalapril (VASOTEC) 5 MG tablet Take 1 tablet by mouth daily. 01/29/21   [provider]  glipiZIDE-metformin (METAGLIP) 2.5-500 MG tablet Take 1 tablet by mouth 2 (two) times daily before a meal.    [provider]  rosuvastatin (CRESTOR) 20 MG tablet Take 20 mg by mouth daily.    [provider]  senna-docusate (SENOKOT-S) 8.6-50 MG tablet Take 1 tablet by mouth 2 (two) times daily. Patient not taking: No sig reported 12/03/20   Deetta Perla, MD   Physical Exam: Vitals:   10/06/21 1812 10/06/21 1853 10/06/21 1926 10/06/21 2020  BP:   100/62 110/83  Pulse: 85  80 75  Resp:   18 18  Temp:  99.4 F (37.4 C)  (!) 101.1 F (38.4 C)  TempSrc:  Oral  Axillary  SpO2: 95%  98% 100%  Weight:      Height:       Constitutional: appears younger than chronological age, NAD, calm, comfortable Eyes: PERRL, lids and conjunctivae normal ENMT: Mucous membranes are moist. Posterior pharynx clear of any exudate or lesions. Age-appropriate dentition. Hearing appropriate Neck: normal, supple, no masses, no thyromegaly Respiratory: clear to auscultation bilaterally, no wheezing, no crackles. Normal respiratory effort. No accessory muscle use.  Cardiovascular: Regular rate and rhythm, no murmurs / rubs / gallops. No extremity edema. 2+ pedal pulses. No carotid bruits.  Abdomen: Mild obese abdomen, no tenderness, no masses palpated, no hepatosplenomegaly. Bowel sounds positive.  Musculoskeletal: no clubbing / cyanosis. No joint deformity upper and lower extremities. Good ROM, no contractures, no atrophy. Normal muscle tone.  Skin: no rashes, lesions, ulcers. No induration Neurologic: Sensation intact. Strength 5/5 in all 4.  Psychiatric: Normal judgment and insight. Alert and oriented x 3. Normal mood.   EKG: Not indicated at this time  Chest x-ray on Admission:  Not indicated at this time.  DG Lumbar Spine Complete  Result Date: 10/06/2021 CLINICAL DATA:  Pt c/o lower back pain that has been going on a while but got worse on Wednesday. Denies strenuous work. Pain worse with twisting motions now with limited ROM. EXAM: LUMBAR SPINE - COMPLETE 4+ VIEW COMPARISON:  None. FINDINGS: No fracture, bone lesion or spondylolisthesis. Mild loss of disc height at L4-L5. Minimal loss of disc height at L2-L3. Remaining disc spaces are well preserved. There are anterior endplate osteophytes throughout the visualized spine. Mild facet degenerative changes at L4-L5 and L5-S1, greatest on the right at L4-L5. Scattered atherosclerotic calcifications in the abdominal aorta. Soft tissues otherwise unremarkable. IMPRESSION: 1. No fracture or acute finding. 2. Mild degenerative changes as detailed above. Electronically Signed   By: Lajean Manes M.D.   On: 10/06/2021 16:03   CT L-SPINE NO CHARGE  Result Date: 10/06/2021 CLINICAL DATA:  Pain EXAM: CT LUMBAR SPINE  WITHOUT CONTRAST TECHNIQUE: Multidetector CT imaging of the lumbar spine was performed without intravenous contrast administration. Multiplanar CT image reconstructions were also generated. RADIATION DOSE REDUCTION: This exam was performed according to the departmental dose-optimization program which includes automated exposure control, adjustment of the mA and/or kV according to patient size and/or use of iterative reconstruction technique. COMPARISON:  None. FINDINGS: Segmentation: 5 lumbar type vertebrae. Alignment: Trace retrolisthesis of L2 on L3. Minimal grade 1 anterolisthesis of L4 on L5. Vertebrae: No acute fracture or focal pathologic process. Paraspinal and other soft tissues: No paraspinal masses or fluid collections. Disc levels: Mild intervertebral disc height loss with endplate spurring most pronounced at L2-3 and L4-5. Severe bilateral facet arthropathy of L4-5 and L5-S1 with suspected high-grade foraminal stenosis  bilaterally at L4-5. At least moderate canal stenosis is suspected at L3-4 and L4-5. IMPRESSION: 1. No acute fracture or traumatic malalignment of the lumbar spine. 2. Multilevel degenerative changes of the lumbar spine, as above. Electronically Signed   By: Davina Poke D.O.   On: 10/06/2021 16:59   CT Renal Stone Study  Result Date: 10/06/2021 CLINICAL DATA:  Flank pain, kidney stone suspected EXAM: CT ABDOMEN AND PELVIS WITHOUT CONTRAST TECHNIQUE: Multidetector CT imaging of the abdomen and pelvis was performed following the standard protocol without IV contrast. RADIATION DOSE REDUCTION: This exam was performed according to the departmental dose-optimization program which includes automated exposure control, adjustment of the mA and/or kV according to patient size and/or use of iterative reconstruction technique. COMPARISON:  None. FINDINGS: Lower chest: There are a few small subpleural nodules within the bilateral lung bases, largest measuring 6 mm at the posterior aspect of the left lower lobe (series 4, image 17). Heart size is normal. Coronary artery atherosclerosis. Hepatobiliary: Unremarkable unenhanced appearance of the liver. No focal liver lesion identified. Gallbladder within normal limits. No hyperdense gallstone. No biliary dilatation. Pancreas: Unremarkable. No pancreatic ductal dilatation or surrounding inflammatory changes. Spleen: Normal in size without focal abnormality. Adrenals/Urinary Tract: Unremarkable adrenal glands. No evidence of solid renal lesion, stone, or hydronephrosis. Minimal nonspecific bilateral perinephric stranding is slightly more pronounced on the right. Urinary bladder wall appears minimally thickened. Stomach/Bowel: Stomach is within normal limits. Moderate volume of stool throughout the colon. No evidence of bowel wall thickening, distention, or inflammatory changes. Vascular/Lymphatic: Scattered aortoiliac atherosclerotic calcifications without aneurysm. No  abdominopelvic lymphadenopathy. Reproductive: Mildly enlarged prostate gland. Other: No free fluid. No abdominopelvic fluid collection. No pneumoperitoneum. No abdominal wall hernia. Musculoskeletal: No acute or significant osseous findings. Facet-predominant lower lumbar spondylosis. IMPRESSION: 1. No evidence of obstructive uropathy. 2. Minimal nonspecific bilateral perinephric stranding is slightly more pronounced on the right. Correlate with urinalysis to exclude the possibility of ascending urinary tract infection. 3. Urinary bladder wall is minimally thickened which could reflect changes related to cystitis or outlet obstruction. 4. Mildly enlarged prostate gland. 5. Moderate volume of stool throughout the colon. 6. Multiple small subpleural nodules within the bilateral lung bases, largest measuring 6 mm at the posterior aspect of the left lower lobe. Recommend a non-contrast Chest CT at 3-6 months. If patient is high risk for malignancy, recommend an additional non-contrast Chest CT at 18-24 months; if patient is low risk for malignancy a non-contrast Chest CT at 18-24 months is optional. These guidelines do not apply to immunocompromised patients and patients with cancer. Follow up in patients with significant comorbidities as clinically warranted. For lung cancer screening, adhere to Lung-RADS guidelines. Reference: Radiology. 2017; 284(1):228-43. Aortic Atherosclerosis (ICD10-I70.0). Electronically Signed  By: Davina Poke D.O.   On: 10/06/2021 17:08    Labs on Admission: I have personally reviewed following labs  CBC: Recent Labs  Lab 10/06/21 1811  WBC 11.3*  NEUTROABS 9.3*  HGB 12.3*  HCT 35.5*  MCV 89.2  PLT 654   Basic Metabolic Panel: Recent Labs  Lab 10/06/21 1811  NA 125*  K 3.6  CL 93*  CO2 23  GLUCOSE 219*  BUN 17  CREATININE 1.04  CALCIUM 7.9*  MG 1.9   GFR: Estimated Creatinine Clearance: 71.6 mL/min (by C-G formula based on SCr of 1.04 mg/dL).  Liver  Function Tests: Recent Labs  Lab 10/06/21 1811  AST 17  ALT 18  ALKPHOS 62  BILITOT 0.8  PROT 6.0*  ALBUMIN 2.8*   Urine analysis:    Component Value Date/Time   COLORURINE YELLOW (A) 10/06/2021 1454   APPEARANCEUR HAZY (A) 10/06/2021 1454   LABSPEC 1.024 10/06/2021 1454   PHURINE 5.0 10/06/2021 1454   GLUCOSEU 150 (A) 10/06/2021 1454   HGBUR SMALL (A) 10/06/2021 1454   BILIRUBINUR NEGATIVE 10/06/2021 1454   KETONESUR 5 (A) 10/06/2021 1454   PROTEINUR 100 (A) 10/06/2021 1454   NITRITE NEGATIVE 10/06/2021 1454   LEUKOCYTESUR TRACE (A) 10/06/2021 1454   Dr. Tobie Poet [facility]  If 7PM-7AM, please contact overnight-coverage provider If 7AM-7PM, please contact day coverage provider [email or web address]  10/06/2021, 9:07 PM

## 2021-10-06 NOTE — Assessment & Plan Note (Signed)
-   Patient takes enalapril 5 mg daily at home, this has not been resumed due to patient presenting with low normotensive - A.m. team to resume when appropriate - Hydralazine 10 mg p.o. as needed every 6 hours for SBP greater than 170, 4 doses ordered

## 2021-10-06 NOTE — Assessment & Plan Note (Signed)
-   Mild asymptomatic - Presumed secondary to hyperglycemia - Corrected serum sodium level is 128, [place] 1999. - Sepsis bolus with sodium chloride - BMP in the a.m.

## 2021-10-06 NOTE — Assessment & Plan Note (Signed)
-   Rosuvastatin 20 mg nightly resumed 

## 2021-10-07 DIAGNOSIS — N12 Tubulo-interstitial nephritis, not specified as acute or chronic: Secondary | ICD-10-CM | POA: Diagnosis present

## 2021-10-07 DIAGNOSIS — K219 Gastro-esophageal reflux disease without esophagitis: Secondary | ICD-10-CM | POA: Diagnosis present

## 2021-10-07 DIAGNOSIS — E876 Hypokalemia: Secondary | ICD-10-CM | POA: Diagnosis present

## 2021-10-07 DIAGNOSIS — K30 Functional dyspepsia: Secondary | ICD-10-CM | POA: Diagnosis present

## 2021-10-07 DIAGNOSIS — E871 Hypo-osmolality and hyponatremia: Secondary | ICD-10-CM | POA: Diagnosis present

## 2021-10-07 DIAGNOSIS — Z888 Allergy status to other drugs, medicaments and biological substances status: Secondary | ICD-10-CM | POA: Diagnosis not present

## 2021-10-07 DIAGNOSIS — I10 Essential (primary) hypertension: Secondary | ICD-10-CM | POA: Diagnosis present

## 2021-10-07 DIAGNOSIS — A419 Sepsis, unspecified organism: Secondary | ICD-10-CM | POA: Diagnosis present

## 2021-10-07 DIAGNOSIS — G8929 Other chronic pain: Secondary | ICD-10-CM | POA: Diagnosis present

## 2021-10-07 DIAGNOSIS — Z981 Arthrodesis status: Secondary | ICD-10-CM | POA: Diagnosis not present

## 2021-10-07 DIAGNOSIS — D72829 Elevated white blood cell count, unspecified: Secondary | ICD-10-CM

## 2021-10-07 DIAGNOSIS — Z86718 Personal history of other venous thrombosis and embolism: Secondary | ICD-10-CM

## 2021-10-07 DIAGNOSIS — M545 Low back pain, unspecified: Secondary | ICD-10-CM | POA: Diagnosis present

## 2021-10-07 DIAGNOSIS — D638 Anemia in other chronic diseases classified elsewhere: Secondary | ICD-10-CM | POA: Diagnosis present

## 2021-10-07 DIAGNOSIS — N10 Acute tubulo-interstitial nephritis: Secondary | ICD-10-CM | POA: Diagnosis present

## 2021-10-07 DIAGNOSIS — N390 Urinary tract infection, site not specified: Secondary | ICD-10-CM

## 2021-10-07 DIAGNOSIS — Y92009 Unspecified place in unspecified non-institutional (private) residence as the place of occurrence of the external cause: Secondary | ICD-10-CM | POA: Diagnosis not present

## 2021-10-07 DIAGNOSIS — Z79899 Other long term (current) drug therapy: Secondary | ICD-10-CM | POA: Diagnosis not present

## 2021-10-07 DIAGNOSIS — Z7901 Long term (current) use of anticoagulants: Secondary | ICD-10-CM | POA: Diagnosis not present

## 2021-10-07 DIAGNOSIS — Z6830 Body mass index (BMI) 30.0-30.9, adult: Secondary | ICD-10-CM | POA: Diagnosis not present

## 2021-10-07 DIAGNOSIS — E1165 Type 2 diabetes mellitus with hyperglycemia: Secondary | ICD-10-CM | POA: Diagnosis present

## 2021-10-07 DIAGNOSIS — E860 Dehydration: Secondary | ICD-10-CM | POA: Diagnosis present

## 2021-10-07 DIAGNOSIS — Z20822 Contact with and (suspected) exposure to covid-19: Secondary | ICD-10-CM | POA: Diagnosis present

## 2021-10-07 DIAGNOSIS — E669 Obesity, unspecified: Secondary | ICD-10-CM | POA: Diagnosis present

## 2021-10-07 DIAGNOSIS — N529 Male erectile dysfunction, unspecified: Secondary | ICD-10-CM | POA: Diagnosis present

## 2021-10-07 DIAGNOSIS — Z7982 Long term (current) use of aspirin: Secondary | ICD-10-CM | POA: Diagnosis not present

## 2021-10-07 DIAGNOSIS — B9561 Methicillin susceptible Staphylococcus aureus infection as the cause of diseases classified elsewhere: Secondary | ICD-10-CM | POA: Diagnosis present

## 2021-10-07 DIAGNOSIS — E785 Hyperlipidemia, unspecified: Secondary | ICD-10-CM | POA: Diagnosis present

## 2021-10-07 LAB — CBC WITH DIFFERENTIAL/PLATELET
Abs Immature Granulocytes: 0.07 10*3/uL (ref 0.00–0.07)
Basophils Absolute: 0 10*3/uL (ref 0.0–0.1)
Basophils Relative: 0 %
Eosinophils Absolute: 0 10*3/uL (ref 0.0–0.5)
Eosinophils Relative: 0 %
HCT: 35.6 % — ABNORMAL LOW (ref 39.0–52.0)
Hemoglobin: 12.2 g/dL — ABNORMAL LOW (ref 13.0–17.0)
Immature Granulocytes: 1 %
Lymphocytes Relative: 7 %
Lymphs Abs: 0.8 10*3/uL (ref 0.7–4.0)
MCH: 30.7 pg (ref 26.0–34.0)
MCHC: 34.3 g/dL (ref 30.0–36.0)
MCV: 89.7 fL (ref 80.0–100.0)
Monocytes Absolute: 1.3 10*3/uL — ABNORMAL HIGH (ref 0.1–1.0)
Monocytes Relative: 12 %
Neutro Abs: 9.1 10*3/uL — ABNORMAL HIGH (ref 1.7–7.7)
Neutrophils Relative %: 80 %
Platelets: 190 10*3/uL (ref 150–400)
RBC: 3.97 MIL/uL — ABNORMAL LOW (ref 4.22–5.81)
RDW: 11.9 % (ref 11.5–15.5)
WBC: 11.3 10*3/uL — ABNORMAL HIGH (ref 4.0–10.5)
nRBC: 0 % (ref 0.0–0.2)

## 2021-10-07 LAB — BASIC METABOLIC PANEL
Anion gap: 11 (ref 5–15)
BUN: 18 mg/dL (ref 8–23)
CO2: 23 mmol/L (ref 22–32)
Calcium: 7.7 mg/dL — ABNORMAL LOW (ref 8.9–10.3)
Chloride: 97 mmol/L — ABNORMAL LOW (ref 98–111)
Creatinine, Ser: 1.06 mg/dL (ref 0.61–1.24)
GFR, Estimated: >60 mL/min (ref >60)
Glucose, Bld: 193 mg/dL — ABNORMAL HIGH (ref 70–99)
Potassium: 3.4 mmol/L — ABNORMAL LOW (ref 3.5–5.1)
Sodium: 131 mmol/L — ABNORMAL LOW (ref 135–145)

## 2021-10-07 LAB — PROTIME-INR
INR: 1.1 (ref 0.8–1.2)
Prothrombin Time: 14.5 seconds (ref 11.4–15.2)

## 2021-10-07 LAB — PHOSPHORUS: Phosphorus: 2.1 mg/dL — ABNORMAL LOW (ref 2.5–4.6)

## 2021-10-07 LAB — HEMOGLOBIN A1C
Hgb A1c MFr Bld: 6.7 % — ABNORMAL HIGH (ref 4.8–5.6)
Mean Plasma Glucose: 145.59 mg/dL

## 2021-10-07 LAB — GLUCOSE, CAPILLARY
Glucose-Capillary: 235 mg/dL — ABNORMAL HIGH (ref 70–99)
Glucose-Capillary: 162 mg/dL — ABNORMAL HIGH (ref 70–99)
Glucose-Capillary: 149 mg/dL — ABNORMAL HIGH (ref 70–99)
Glucose-Capillary: 133 mg/dL — ABNORMAL HIGH (ref 70–99)

## 2021-10-07 LAB — PROCALCITONIN: Procalcitonin: 1.64 ng/mL

## 2021-10-07 MED ORDER — TRAMADOL HCL 50 MG PO TABS
50.0000 mg | ORAL_TABLET | Freq: Four times a day (QID) | ORAL | Status: DC | PRN
  Administered 2021-10-07 – 2021-10-08 (×3): 50 mg via ORAL
  Filled 2021-10-07 (×3): qty 1

## 2021-10-07 MED ORDER — KETOROLAC TROMETHAMINE 30 MG/ML IJ SOLN: 15.0000 mg | Freq: Three times a day (TID) | INTRAMUSCULAR | Status: AC | PRN

## 2021-10-07 MED ORDER — POTASSIUM CHLORIDE CRYS ER 20 MEQ PO TBCR
40.0000 meq | EXTENDED_RELEASE_TABLET | ORAL | Status: AC
  Administered 2021-10-07 (×2): 40 meq via ORAL
  Filled 2021-10-07 (×2): qty 2

## 2021-10-07 MED ORDER — SODIUM CHLORIDE 0.9 % IV SOLN: INTRAVENOUS | Status: DC

## 2021-10-07 NOTE — Assessment & Plan Note (Addendum)
-   Presented with fever, tachycardia, leukocytosis, borderline blood pressure on the lower side due to acute pyelonephritis. -Urine cultures growing Staph aureus.  Follow sensitivities.  Antibiotics as below.  DC IV fluids.

## 2021-10-07 NOTE — Progress Notes (Signed)
°  Progress Note   Patient: Douglas Lyons XBD:[id number] DOB: 24-Jan-1946 DOA: 10/06/2021     0 DOS: the patient was seen and examined on 10/07/2021   Brief hospital course: 76 year old male with history of hypertension, hyperlipidemia, history of DVT having completed anticoagulation course, GERD, erectile dysfunction, non-insulin-dependent diabetes mellitus presented with low back pain.  Patient, Tmax was 101.4 with sodium of 125, potassium of 3.6, creatinine of 1.24 and WBC of 11.3.  COVID-19 and influenza testing were negative.  He was started on IV fluids and antibiotics for possible pyelonephritis.  Assessment and Plan: * Pyelonephritis- (present on admission) - CT renal stone study showed features of pyelonephritis, more on the right side. -Continue Rocephin.  Follow cultures. -Pain management.  Sepsis secondary to UTI [facility])- (present on admission) - Presented with fever, tachycardia, leukocytosis, borderline blood pressure on the lower side due to acute pyelonephritis. -Follow cultures.  Antibiotics as below.  Continue normal saline at 75 cc an hour  Hypertension- (present on admission) - Blood pressure on the lower side.  Enalapril on hold.  Continue hydralazine as needed.  Monitor blood pressure.  Hyponatremia- (present on admission) - Possibly from poor oral intake and dehydration.  Continue IV fluids.  Sodium 131 this morning.  Monitor.  Hyperlipidemia- (present on admission) - Continue statin  History of DVT (deep vein thrombosis) - Completed anticoagulation course  Diabetes mellitus, type 2 (HCC) Diabetes mellitus type 2 with hyperglycemia -Home meds including glipizide and metformin on hold.  Continue CBGs with SSI.  Leukocytosis- (present on admission) - Mild.  Monitor.  Hypokalemia -Replace.  Repeat a.m. labs      Subjective:  Patient seen and examined at bedside.  He feels much better and feels that his flank pain is improving.  Still feels weak.  Had fever  overnight.  No nausea, vomiting, chest pain or worsening shortness of breath reported. Physical Exam: Vitals:   10/06/21 2020 10/06/21 2350 10/07/21 0405 10/07/21 0908  BP: 110/83 (!) 97/58 (!) 132/92 115/63  Pulse: 75 70 99 80  Resp: 18 16 20 18   Temp: (!) 101.1 F (38.4 C) 98.9 F (37.2 C) 98.6 F (37 C) 99.1 F (37.3 C)  TempSrc: Axillary Oral    SpO2: 100% 97% 90% 98%  Weight: 93.9 kg     Height: 5\' 11"  (1.803 m)      General: No acute distress, currently on room air ENT/neck: No elevated JVD.  No obvious masses  respiratory: Bilateral decreased breath sounds at bases with some scattered crackles CVS: S1-S2 heard, rate controlled Abdominal: Soft, nontender, nondistended, no organomegaly, bowel sounds heard Extremities: No cyanosis, clubbing, edema CNS: Alert, awake and oriented.  No focal neurologic deficit.  Moving extremities. Lymph: No cervical lymphadenopathy Skin: No rashes, lesions, ulcers Psych: Normal mood, affect and judgment Musculoskeletal: No obvious joint deformity/tenderness/swelling Genitourinary: Mild right flank tenderness present  Data Reviewed: I have reviewed the patient's labs and imaging myself.  Today sodium is 131, potassium 3.4, WBCs of 11.3, procalcitonin of 1.64, hemoglobin of 12.2 with CBGs in the 200s.  Urine culture negative so far.   Family Communication: None at bedside  Disposition: Status is: Observation The patient will require care spanning > 2 midnights and should be moved to inpatient because: Of need for IV antibiotics and fluids        Planned Discharge Destination: Home     Time spent: 50 minutes  Author: Aline August, MD 10/07/2021 10:02 AM  For on call review [email or web address].

## 2021-10-07 NOTE — Assessment & Plan Note (Signed)
-   Completed anticoagulation course

## 2021-10-07 NOTE — TOC CM/SW Note (Signed)
°  Transition of Care [facility]) Screening Note   Patient Details  Name: Douglas Lyons Date of Birth: 24-Jan-1946   [facility]) CM/SW Contact:    Magnus Ivan, LCSW Phone Number: 10/07/2021, 12:27 PM    Transition of Care Department [facility]) has reviewed patient and no TOC needs have been identified at this time. We will continue to monitor patient advancement through interdisciplinary progression rounds. If new patient transition needs arise, please place a TOC consult.

## 2021-10-07 NOTE — Assessment & Plan Note (Addendum)
Resolved

## 2021-10-07 NOTE — Assessment & Plan Note (Signed)
Continue statin. 

## 2021-10-07 NOTE — Assessment & Plan Note (Addendum)
-   CT renal stone study showed features of pyelonephritis, more on the right side. -Continue Rocephin.  Follow cultures. -Flank pain improving.  Continue pain management.

## 2021-10-07 NOTE — Assessment & Plan Note (Signed)
Diabetes mellitus type 2 with hyperglycemia -Home meds including glipizide and metformin on hold.  Continue CBGs with SSI.

## 2021-10-07 NOTE — Hospital Course (Signed)
76 year old male with history of hypertension, hyperlipidemia, history of DVT having completed anticoagulation course, GERD, erectile dysfunction, non-insulin-dependent diabetes mellitus presented with low back pain.  Patient, Tmax was 101.4 with sodium of 125, potassium of 3.6, creatinine of 1.24 and WBC of 11.3.  COVID-19 and influenza testing were negative.  He was started on IV fluids and antibiotics for possible pyelonephritis.

## 2021-10-07 NOTE — Assessment & Plan Note (Addendum)
-   Blood pressure currently stable.  Enalapril on hold.  Continue hydralazine as needed.  Monitor blood pressure.

## 2021-10-07 NOTE — Assessment & Plan Note (Addendum)
-   Possibly from poor oral intake and dehydration.   -Encourage oral intake.  Sodium 129 today.  DC IV fluids.  Repeat a.m. labs.

## 2021-10-08 DIAGNOSIS — R5381 Other malaise: Secondary | ICD-10-CM

## 2021-10-08 DIAGNOSIS — K921 Melena: Secondary | ICD-10-CM

## 2021-10-08 DIAGNOSIS — E871 Hypo-osmolality and hyponatremia: Secondary | ICD-10-CM | POA: Diagnosis not present

## 2021-10-08 DIAGNOSIS — N10 Acute tubulo-interstitial nephritis: Secondary | ICD-10-CM

## 2021-10-08 DIAGNOSIS — D72829 Elevated white blood cell count, unspecified: Secondary | ICD-10-CM | POA: Diagnosis not present

## 2021-10-08 DIAGNOSIS — Z86718 Personal history of other venous thrombosis and embolism: Secondary | ICD-10-CM | POA: Diagnosis not present

## 2021-10-08 LAB — CBC WITH DIFFERENTIAL/PLATELET
Abs Immature Granulocytes: 0.11 10*3/uL — ABNORMAL HIGH (ref 0.00–0.07)
Basophils Absolute: 0 10*3/uL (ref 0.0–0.1)
Basophils Relative: 0 %
Eosinophils Absolute: 0 10*3/uL (ref 0.0–0.5)
Eosinophils Relative: 0 %
HCT: 32.2 % — ABNORMAL LOW (ref 39.0–52.0)
Hemoglobin: 10.7 g/dL — ABNORMAL LOW (ref 13.0–17.0)
Immature Granulocytes: 1 %
Lymphocytes Relative: 6 %
Lymphs Abs: 0.6 10*3/uL — ABNORMAL LOW (ref 0.7–4.0)
MCH: 30.5 pg (ref 26.0–34.0)
MCHC: 33.2 g/dL (ref 30.0–36.0)
MCV: 91.7 fL (ref 80.0–100.0)
Monocytes Absolute: 1 10*3/uL (ref 0.1–1.0)
Monocytes Relative: 10 %
Neutro Abs: 8.2 10*3/uL — ABNORMAL HIGH (ref 1.7–7.7)
Neutrophils Relative %: 83 %
Platelets: 197 10*3/uL (ref 150–400)
RBC: 3.51 MIL/uL — ABNORMAL LOW (ref 4.22–5.81)
RDW: 12.1 % (ref 11.5–15.5)
WBC: 10 10*3/uL (ref 4.0–10.5)
nRBC: 0 % (ref 0.0–0.2)

## 2021-10-08 LAB — BASIC METABOLIC PANEL
Anion gap: 8 (ref 5–15)
BUN: 17 mg/dL (ref 8–23)
CO2: 20 mmol/L — ABNORMAL LOW (ref 22–32)
Calcium: 7.6 mg/dL — ABNORMAL LOW (ref 8.9–10.3)
Chloride: 101 mmol/L (ref 98–111)
Creatinine, Ser: 0.79 mg/dL (ref 0.61–1.24)
GFR, Estimated: >60 mL/min (ref >60)
Glucose, Bld: 161 mg/dL — ABNORMAL HIGH (ref 70–99)
Potassium: 4.1 mmol/L (ref 3.5–5.1)
Sodium: 129 mmol/L — ABNORMAL LOW (ref 135–145)

## 2021-10-08 LAB — MAGNESIUM: Magnesium: 2 mg/dL (ref 1.7–2.4)

## 2021-10-08 LAB — OCCULT BLOOD X 1 CARD TO LAB, STOOL: Fecal Occult Bld: NEGATIVE

## 2021-10-08 LAB — GLUCOSE, CAPILLARY
Glucose-Capillary: 155 mg/dL — ABNORMAL HIGH (ref 70–99)
Glucose-Capillary: 272 mg/dL — ABNORMAL HIGH (ref 70–99)
Glucose-Capillary: 158 mg/dL — ABNORMAL HIGH (ref 70–99)
Glucose-Capillary: 166 mg/dL — ABNORMAL HIGH (ref 70–99)

## 2021-10-08 MED ORDER — KETOROLAC TROMETHAMINE 15 MG/ML IJ SOLN
15.0000 mg | Freq: Once | INTRAMUSCULAR | Status: AC
  Administered 2021-10-08: 15 mg via INTRAVENOUS
  Filled 2021-10-08: qty 1

## 2021-10-08 MED ORDER — POLYETHYLENE GLYCOL 3350 17 G PO PACK
17.0000 g | PACK | Freq: Every day | ORAL | Status: DC | PRN
  Administered 2021-10-08: 17 g via ORAL
  Filled 2021-10-08: qty 1

## 2021-10-08 MED ORDER — SENNOSIDES-DOCUSATE SODIUM 8.6-50 MG PO TABS
1.0000 | ORAL_TABLET | Freq: Two times a day (BID) | ORAL | Status: DC
  Administered 2021-10-08: 21:00:00 1 via ORAL
  Filled 2021-10-08 (×3): qty 1

## 2021-10-08 MED ORDER — BISACODYL 10 MG RE SUPP: 10.0000 mg | Freq: Every day | RECTAL | Status: DC | PRN

## 2021-10-08 MED ORDER — PANTOPRAZOLE SODIUM 40 MG PO TBEC
40.0000 mg | DELAYED_RELEASE_TABLET | Freq: Every day | ORAL | Status: DC
  Administered 2021-10-08 – 2021-10-09 (×2): 40 mg via ORAL
  Filled 2021-10-08 (×2): qty 1

## 2021-10-08 MED ORDER — LIDOCAINE 5 % EX PTCH
1.0000 | MEDICATED_PATCH | CUTANEOUS | Status: DC
  Administered 2021-10-08: 02:00:00 1 via TRANSDERMAL
  Filled 2021-10-08 (×2): qty 1

## 2021-10-08 NOTE — Assessment & Plan Note (Signed)
-   Patient is having melena for the last few days intermittently.  Hemoglobin 10.7 this morning, might be hemodilutional as well.  DC IV fluids.  DC Lovenox.  Start Protonix 40 mg p.o. once a day.  Consult GI.  Monitor H&H.

## 2021-10-08 NOTE — Consult Note (Signed)
GI Inpatient Consult Note  Reason for Consult: Black stool, anemia    Attending Requesting Consult: Dr. Aline August, MD  History of Present Illness: Douglas Lyons is a 76 y.o. male seen for evaluation of black stool and mild anemia at the request of hospitalist - Dr. Aline August. 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 He presented to the [facility] ED 2/11 for chief complaint of acute low back pain found to have sepsis 2/2 pyelonephritis. Pyelonephritis is improving clinically and sepsis is resolving. GI is consulted today for complaint of melena over the last few days. Hemoglobin 10.7 this morning and yesterday was 12.2. Baseline hemoglobin 14s five months ago. He reports on Thursday and Friday of this week he was taking Pepto-Bismol for nausea and upset stomach. He estimates he had more than 1/2 the bottle. He came in to the ED the following morning due to back pain where he was found to be septic 2/2 pyelonephritis. He reports last night he had black bowel movement and this afternoon he had another black BM. The BM is formed and without any foul odor. The BM was not tarry appearing. He has not noticed any overt hematochezia. He denies any associated abdominal pain or abdominal cramping. He denies any nausea, vomiting, dysphagia, heartburn, reflux, odynophagia, early satiety, or unintentional weight loss. He denies any history of heartburn or reflux. He does not have to take any frequent OTC acid suppression therapies. He had colonoscopy 09/2020 performed by Dr. Haig Prophet which showed one subcentimeter colon polyp removed from proximal transverse colon and sigmoid diverticulosis. He has been receiving IV fluids per sepsis protocol. Hospitalist started Protonix for concerns of GI bleed. Patient denies any hx of GI bleeding. He did complete 28-month course of anticoagulant last year due to DVT. He denies any frequent NSAID  use. Patient is very active at baseline and reports he works full-time in [employer].     CSY 09/2020 (Dr. Haig Prophet): - One less than 1 mm polyp in the proximal transverse colon, removed with a jumbo cold forceps. Resected and retrieved. - Diverticulosis in the sigmoid colon. - Internal hemorrhoids. - The examination was otherwise normal on direct and retroflexion views.  Past Medical History:  Past Medical History:  Diagnosis Date   Cataracts, bilateral    Degenerative joint disease of knee, left    Diabetes mellitus, type 2 (HCC)    Diverticulosis    Erectile dysfunction    GERD (gastroesophageal reflux disease)    Heme positive stool    Hyperlipidemia    Hypertension    Lumbar disc herniation    Overweight (BMI 25.0-29.9)     Problem List: Patient Active Problem List   Diagnosis Date Noted   Melena 10/08/2021   Physical deconditioning 10/08/2021   Sepsis secondary to UTI ([place]) 10/07/2021   Hyponatremia 10/07/2021   Leukocytosis 10/07/2021   Acute pyelonephritis 10/07/2021   Pyelonephritis 10/06/2021   History of DVT (deep vein thrombosis) 10/06/2021   Hyperlipidemia 01/26/2021   Overweight (BMI 25.0-29.9) 01/26/2021   Acute deep vein thrombosis (DVT) of popliteal vein of right lower extremity ([place]) 01/26/2021   Erectile dysfunction 11/28/2020   Gait instability 11/27/2020   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   Diabetes mellitus, type 2 ([place])    Hypertension    GERD (gastroesophageal reflux disease)    History of nonmelanoma skin cancer 03/13/2020    Past Surgical  History: Past Surgical History:  Procedure Laterality Date   ANTERIOR CERVICAL DECOMP/DISCECTOMY FUSION N/A 12/01/2020   Procedure: ANTERIOR CERVICAL DECOMPRESSION/DISCECTOMY FUSION 2 LEVELS C3-4, C4-5;  Surgeon: Deetta Perla, MD;  Location: [facility] ORS;  Service: Neurosurgery;  Laterality: N/A;   COLONOSCOPY     COLONOSCOPY N/A 07/26/2015   Procedure: COLONOSCOPY;  Surgeon: Manya Silvas, MD;  Location: [facility] ENDOSCOPY;  Service: Endoscopy;  Laterality: N/A;   COLONOSCOPY N/A 09/29/2020   Procedure: COLONOSCOPY;  Surgeon: [name], MD;  Location: [facility] ENDOSCOPY;  Service: Endoscopy;  Laterality: N/A;   SHOULDER ARTHROSCOPY Right    VASECTOMY      Allergies: Allergies  Allergen Reactions   Rosuvastatin Other (See Comments)    Other reaction(s): Muscle Pain Is taking every other day and he feels much better as of 12/30/2016 Is taking every other day and he feels much better as of 12/30/2016  Is taking every other day and he feels much better as of 12/30/2016 Other reaction(s): Muscle Pain Is taking every other day and he feels much better as of 12/30/2016 Is taking every other day and he feels much better as of 12/30/2016    Home Medications: Medications Prior to Admission  Medication Sig Dispense Refill Last Dose   acetaminophen (TYLENOL) 500 MG tablet Take by mouth.   prn at prn   acetaminophen (TYLENOL) 650 MG CR tablet Take 650 mg by mouth in the morning and at bedtime.   prn at prn   aspirin EC 81 MG tablet Take 81 mg by mouth daily. Swallow whole.   10/05/2021   cholecalciferol (VITAMIN D3) 25 MCG (1000 UNIT) tablet Take 1,000 Units by mouth daily.   10/05/2021   enalapril (VASOTEC) 5 MG tablet Take 1 tablet by mouth daily.   10/05/2021   glipiZIDE-metformin (METAGLIP) 2.5-500 MG tablet Take 1 tablet by mouth 2 (two) times daily before a meal.   10/05/2021   rosuvastatin (CRESTOR) 20 MG tablet Take 20 mg by mouth daily.   10/05/2021   senna-docusate (SENOKOT-S) 8.6-50 MG tablet Take 1 tablet by mouth 2 (two) times daily. (Patient not taking: Reported on 12/26/2020) 60 tablet 0    zinc gluconate 3.75 mg/mL SOLN Take 1 tablet by mouth daily.   10/05/2021   Home medication reconciliation was completed with the patient.   Scheduled Inpatient Medications:    cholecalciferol  1,000 Units Oral Daily   insulin aspart  0-15 Units Subcutaneous TID WC    insulin aspart  0-5 Units Subcutaneous QHS   lidocaine  1 patch Transdermal Q24H   pantoprazole  40 mg Oral Daily   rosuvastatin  5 mg Oral QHS   senna-docusate  1 tablet Oral BID    Continuous Inpatient Infusions:    cefTRIAXone (ROCEPHIN)  IV Stopped (10/07/21 1821)    PRN Inpatient Medications:  acetaminophen **OR** acetaminophen, bisacodyl, hydrALAZINE, ondansetron **OR** ondansetron (ZOFRAN) IV, polyethylene glycol, traMADol  Family History: family history includes Cancer in his father.  The patient's family history is negative for inflammatory bowel disorders, GI malignancy, or solid organ transplantation.  Social History:   reports that he has never smoked. He has never used smokeless tobacco. He reports current alcohol use. He reports that he does not use drugs. The patient denies ETOH, tobacco, or drug use.   Review of Systems: Constitutional: Weight is stable.  Eyes: No changes in vision. ENT: No oral lesions, sore throat.  GI: see HPI.  Heme/Lymph: No easy bruising.  CV: No chest pain.  GU: No hematuria.  Integumentary: No rashes.  Neuro: No headaches.  Psych: No depression/anxiety.  Endocrine: No heat/cold intolerance.  Allergic/Immunologic: No urticaria.  Resp: No cough, SOB.  Musculoskeletal: No joint swelling.    Physical Examination: BP 130/68 (BP Location: Right Arm)    Pulse 73    Temp 97.7 F (36.5 C)    Resp 16    Ht 5\' 11"  (1.803 m)    Wt 93.9 kg    SpO2 99%    BMI 28.87 kg/m  Gen: NAD, alert and oriented x 4 HEENT: PEERLA, EOMI, Neck: supple, no JVD or thyromegaly Chest: CTA bilaterally, no wheezes, crackles, or other adventitious sounds CV: RRR, no m/g/c/r Abd: soft, NT, ND, +BS in all four quadrants; no HSM, guarding, ridigity, or rebound tenderness Ext: no edema, well perfused with 2+ pulses, Skin: no rash or lesions noted Lymph: no LAD  Data: Lab Results  Component Value Date   WBC 10.0 10/08/2021   HGB 10.7 (L) 10/08/2021   HCT 32.2  (L) 10/08/2021   MCV 91.7 10/08/2021   PLT 197 10/08/2021   Recent Labs  Lab 10/06/21 1811 10/07/21 0423 10/08/21 0349  HGB 12.3* 12.2* 10.7*   Lab Results  Component Value Date   NA 129 (L) 10/08/2021   K 4.1 10/08/2021   CL 101 10/08/2021   CO2 20 (L) 10/08/2021   BUN 17 10/08/2021   CREATININE 0.79 10/08/2021   Lab Results  Component Value Date   ALT 18 10/06/2021   AST 17 10/06/2021   ALKPHOS 62 10/06/2021   BILITOT 0.8 10/06/2021   Recent Labs  Lab 10/07/21 0423  INR 1.1   Assessment/Plan:  76 y/o Caucasian male with a PMH of HTN, HLD, Hx of DVT not currently on chronic anticoagulation, GERD, ED, and non-insulin dependent diabetes mellitus presented to the [facility] ED over the weekend for chief complaint of low back pain found to have sepsis 2/2 acute pyelonephritis. GI consulted in setting of black stool and mild anemia with concerns of GI bleed.   Black stool/Mild anemia - H&H stable with hemoglobin down from 12.2 yesterday to 10.7 this morning. Etiology of anemia is most likely hemodilutional. I personally saw the patient's black BM this afternoon and this did not appear like a melanotic stool. I believe that patient's black stool is 2/2 use of Pepto-Bismol. No overt gastrointestinal blood loss.   Sepsis 2/2 pyelonephritis - improving  Hyponatremia - possibly 2/2 dehydration in setting of pyelonephritis. 129 today.   Hx of DVT - NOT currently on anticoagulation s/p provoked DVT last year  Recommendations:  - H&H stable with no overt gastrointestinal blood loss - Continue to monitor H&H closely. Transfuse for Hgb <7.0.  - No concern for melena/GIB at this time. Clinically, his black stool is most likely 2/2 underlying use of Pepto-Bismol. I suspect his stools will return to being brown in very near future.  - No plans for EGD as inpatient  - He can continue PPI for gastric protection for 2-week course and follow-up with PCP for hemoglobin re-check  - GI will sign  off at this time. If there are further concerns or questions, please call Dr. Alice Reichert.    Thank you for the consult. Please call with questions or concerns.  Reeves Forth [facility] [phone] (Cell)

## 2021-10-08 NOTE — Progress Notes (Signed)
Progress Note   Patient: Douglas Lyons KTG:[id number] DOB: 1945/12/05 DOA: 10/06/2021     1 DOS: the patient was seen and examined on 10/08/2021   Brief hospital course: 76 year old male with history of hypertension, hyperlipidemia, history of DVT having completed anticoagulation course, GERD, erectile dysfunction, non-insulin-dependent diabetes mellitus presented with low back pain.  Patient, Tmax was 101.4 with sodium of 125, potassium of 3.6, creatinine of 1.24 and WBC of 11.3.  COVID-19 and influenza testing were negative.  He was started on IV fluids and antibiotics for possible pyelonephritis.  Assessment and Plan: * Pyelonephritis- (present on admission) - CT renal stone study showed features of pyelonephritis, more on the right side. -Continue Rocephin.  Follow cultures. -Flank pain improving.  Continue pain management.  Sepsis secondary to UTI [facility])- (present on admission) - Presented with fever, tachycardia, leukocytosis, borderline blood pressure on the lower side due to acute pyelonephritis. -Urine cultures growing Staph aureus.  Follow sensitivities.  Antibiotics as below.  DC IV fluids.  Melena - Patient is having melena for the last few days intermittently.  Hemoglobin 10.7 this morning, might be hemodilutional as well.  DC IV fluids.  DC Lovenox.  Start Protonix 40 mg p.o. once a day.  Consult GI.  Monitor H&H.  Hypertension- (present on admission) - Blood pressure currently stable.  Enalapril on hold.  Continue hydralazine as needed.  Monitor blood pressure.  Hyponatremia- (present on admission) - Possibly from poor oral intake and dehydration.   -Encourage oral intake.  Sodium 129 today.  DC IV fluids.  Repeat a.m. labs.  Hyperlipidemia- (present on admission) - Continue statin  History of DVT (deep vein thrombosis) - Completed anticoagulation course  Diabetes mellitus, type 2 (HCC) Diabetes mellitus type 2 with hyperglycemia -Home meds including glipizide and  metformin on hold.  Continue CBGs with SSI.  Leukocytosis- (present on admission) - Resolved.  Physical deconditioning - PT eval        Subjective:  Patient seen and examined at bedside.  Feels much better and flank pain is improving.  Having intermittent black stools.  Denies worsening abdominal pain.  No overnight fever, vomiting, shortness of breath reported.  Physical Exam: Vitals:   10/07/21 1230 10/07/21 2134 10/08/21 0602 10/08/21 0810  BP: 112/72 (!) 141/70 130/71 130/68  Pulse: 77 77 76 73  Resp: 20 18 18 16   Temp: 98.8 F (37.1 C) 98 F (36.7 C) 98.1 F (36.7 C) 97.7 F (36.5 C)  TempSrc:      SpO2: 100% 98% 97% 99%  Weight:      Height:       General: No acute distress, currently on room air ENT/neck: No elevated JVD.  No obvious masses  respiratory: Bilateral decreased breath sounds at bases with some scattered crackles CVS: S1-S2 heard, rate controlled Abdominal: Soft, nontender, nondistended, no organomegaly, bowel sounds heard Extremities: No cyanosis, clubbing, edema CNS: Alert, awake and oriented.  No focal neurologic deficit.  Moving extremities. Lymph: No cervical lymphadenopathy Skin: No rashes, lesions, ulcers Psych: Normal mood, affect and judgment Musculoskeletal: No obvious joint deformity/tenderness/swelling   Data Reviewed: I have reviewed patient's investigations since admission myself.  Today sodium is 129, bicarb of 20, hemoglobin of 10.7.  Fecal blood is negative.    Family Communication: None at bedside  Disposition: Status is: Inpatient Remains inpatient appropriate because: Of need for IV antibiotics.  GI evaluation for melena          Planned Discharge Destination: Home  Time spent: 50 minutes  Author: Aline August, MD 10/08/2021 10:11 AM  For on call review [email or web address].

## 2021-10-08 NOTE — Evaluation (Signed)
Physical Therapy Evaluation Patient Details Name: Douglas Lyons MRN: [id number] DOB: 26-Jun-1946 Today's Date: 10/08/2021  History of Present Illness  Mr. Hulbert is a 76 year old male with history of hypertension, hyperlipidemia, history of DVT, completed anticoagulation course, GERD, erectile dysfunction, non-insulin-dependent diabetes mellitus, mild obesity, who presents emergency department for chief concerns of low back pain. He was started on IV fluids and antibiotics for possible pyelonephritis.   Clinical Impression  Patient received in bed, he is talkative, joking. Reports no pain. He ambulated 300 feet with RW and min guard to supervision for hallway mobility. Patient continues to work full time at baseline and uses no AD. He will continue to benefit from skilled PT while here to improve strength and functional independence.        Recommendations for follow up therapy are one component of a multi-disciplinary discharge planning process, led by the attending physician.  Recommendations may be updated based on patient status, additional functional criteria and insurance authorization.  Follow Up Recommendations No PT follow up    Assistance Recommended at Discharge None  Patient can return home with the following  A little help with walking and/or transfers;Help with stairs or ramp for entrance    Equipment Recommendations None recommended by PT  Recommendations for Other Services       Functional Status Assessment Patient has had a recent decline in their functional status and demonstrates the ability to make significant improvements in function in a reasonable and predictable amount of time.     Precautions / Restrictions Precautions Precautions: Fall Precaution Comments: mod fall Restrictions Weight Bearing Restrictions: No      Mobility  Bed Mobility Overal bed mobility: Modified Independent                  Transfers Overall transfer level: Modified  independent Equipment used: Rolling walker (2 wheels)                    Ambulation/Gait Ambulation/Gait assistance: Supervision Gait Distance (Feet): 300 Feet Assistive device: Rolling walker (2 wheels) Gait Pattern/deviations: Step-through pattern, Trunk flexed Gait velocity: slightly decreased     General Gait Details: patient is generally safe with ambulation using rolling walked, however he does not usually use a walker at baseline.  Stairs            Wheelchair Mobility    Modified Rankin (Stroke Patients Only)       Balance Overall balance assessment: Modified Independent, Mild deficits observed, not formally tested                                           Pertinent Vitals/Pain Pain Assessment Pain Assessment: No/denies pain    Home Living Family/patient expects to be discharged to:: Private residence Living Arrangements: Spouse/significant other Available Help at Discharge: Family;Available 24 hours/day;Friend(s) Type of Home: House Home Access: Stairs to enter Entrance Stairs-Rails: Right;Left;Can reach both Entrance Stairs-Number of Steps: 3   Home Layout: One level Home Equipment: Conservation officer, nature (2 wheels)      Prior Function Prior Level of Function : Independent/Modified Independent             Mobility Comments: working full time currently ADLs Comments: independent     Hand Dominance   Dominant Hand: Right    Extremity/Trunk Assessment   Upper Extremity Assessment Upper Extremity Assessment: Overall WFL for tasks assessed  Lower Extremity Assessment Lower Extremity Assessment: Generalized weakness    Cervical / Trunk Assessment Cervical / Trunk Assessment: Normal  Communication   Communication: No difficulties  Cognition Arousal/Alertness: Awake/alert Behavior During Therapy: WFL for tasks assessed/performed Overall Cognitive Status: Within Functional Limits for tasks assessed                                  General Comments: very talkative, pleasant        General Comments      Exercises     Assessment/Plan    PT Assessment Patient needs continued PT services  PT Problem List Decreased strength;Decreased mobility;Decreased activity tolerance       PT Treatment Interventions Gait training;Stair training;Therapeutic activities;Patient/family education    PT Goals (Current goals can be found in the Care Plan section)  Acute Rehab PT Goals Patient Stated Goal: to return home PT Goal Formulation: With patient Time For Goal Achievement: 10/15/21 Potential to Achieve Goals: Good    Frequency Min 2X/week     Co-evaluation               AM-PAC PT "6 Clicks" Mobility  Outcome Measure Help needed turning from your back to your side while in a flat bed without using bedrails?: None Help needed moving from lying on your back to sitting on the side of a flat bed without using bedrails?: None Help needed moving to and from a bed to a chair (including a wheelchair)?: None Help needed standing up from a chair using your arms (e.g., wheelchair or bedside chair)?: None Help needed to walk in hospital room?: None Help needed climbing 3-5 steps with a railing? : A Little 6 Click Score: 23    End of Session Equipment Utilized During Treatment: Gait belt Activity Tolerance: Patient tolerated treatment well Patient left: in bed;with call bell/phone within reach Nurse Communication: Mobility status PT Visit Diagnosis: Unsteadiness on feet (R26.81);Muscle weakness (generalized) (M62.81)    Time: 4431-5400 PT Time Calculation (min) (ACUTE ONLY): 23 min   Charges:   PT Evaluation $PT Eval Low Complexity: 1 Low PT Treatments $Gait Training: 8-22 mins        Benton Tooker, PT, GCS 10/08/21,12:46 PM

## 2021-10-08 NOTE — Assessment & Plan Note (Signed)
PT eval.

## 2021-10-09 DIAGNOSIS — D72829 Elevated white blood cell count, unspecified: Secondary | ICD-10-CM | POA: Diagnosis not present

## 2021-10-09 DIAGNOSIS — A419 Sepsis, unspecified organism: Secondary | ICD-10-CM | POA: Diagnosis not present

## 2021-10-09 DIAGNOSIS — E871 Hypo-osmolality and hyponatremia: Secondary | ICD-10-CM | POA: Diagnosis not present

## 2021-10-09 DIAGNOSIS — N10 Acute tubulo-interstitial nephritis: Secondary | ICD-10-CM | POA: Diagnosis not present

## 2021-10-09 LAB — CBC WITH DIFFERENTIAL/PLATELET
Abs Immature Granulocytes: 0.08 10*3/uL — ABNORMAL HIGH (ref 0.00–0.07)
Basophils Absolute: 0 10*3/uL (ref 0.0–0.1)
Basophils Relative: 0 %
Eosinophils Absolute: 0.1 10*3/uL (ref 0.0–0.5)
Eosinophils Relative: 1 %
HCT: 31.8 % — ABNORMAL LOW (ref 39.0–52.0)
Hemoglobin: 10.7 g/dL — ABNORMAL LOW (ref 13.0–17.0)
Immature Granulocytes: 1 %
Lymphocytes Relative: 9 %
Lymphs Abs: 0.8 10*3/uL (ref 0.7–4.0)
MCH: 30.2 pg (ref 26.0–34.0)
MCHC: 33.6 g/dL (ref 30.0–36.0)
MCV: 89.8 fL (ref 80.0–100.0)
Monocytes Absolute: 1 10*3/uL (ref 0.1–1.0)
Monocytes Relative: 12 %
Neutro Abs: 6.8 10*3/uL (ref 1.7–7.7)
Neutrophils Relative %: 77 %
Platelets: 253 10*3/uL (ref 150–400)
RBC: 3.54 MIL/uL — ABNORMAL LOW (ref 4.22–5.81)
RDW: 12.2 % (ref 11.5–15.5)
WBC: 8.8 10*3/uL (ref 4.0–10.5)
nRBC: 0 % (ref 0.0–0.2)

## 2021-10-09 LAB — URINE CULTURE: Culture: >=100000 — AB

## 2021-10-09 LAB — BASIC METABOLIC PANEL
Anion gap: 4 — ABNORMAL LOW (ref 5–15)
BUN: 11 mg/dL (ref 8–23)
CO2: 26 mmol/L (ref 22–32)
Calcium: 7.9 mg/dL — ABNORMAL LOW (ref 8.9–10.3)
Chloride: 102 mmol/L (ref 98–111)
Creatinine, Ser: 0.93 mg/dL (ref 0.61–1.24)
GFR, Estimated: >60 mL/min (ref >60)
Glucose, Bld: 203 mg/dL — ABNORMAL HIGH (ref 70–99)
Potassium: 4.4 mmol/L (ref 3.5–5.1)
Sodium: 132 mmol/L — ABNORMAL LOW (ref 135–145)

## 2021-10-09 LAB — MAGNESIUM: Magnesium: 2 mg/dL (ref 1.7–2.4)

## 2021-10-09 LAB — GLUCOSE, CAPILLARY: Glucose-Capillary: 197 mg/dL — ABNORMAL HIGH (ref 70–99)

## 2021-10-09 MED ORDER — ONDANSETRON HCL 4 MG PO TABS: 4.0000 mg | ORAL_TABLET | Freq: Four times a day (QID) | ORAL | 0 refills | Status: DC | PRN

## 2021-10-09 MED ORDER — PANTOPRAZOLE SODIUM 40 MG PO TBEC: 40.0000 mg | DELAYED_RELEASE_TABLET | Freq: Every day | ORAL | 0 refills | Status: DC

## 2021-10-09 MED ORDER — TRAMADOL HCL 50 MG PO TABS: 50.0000 mg | ORAL_TABLET | Freq: Four times a day (QID) | ORAL | 0 refills | Status: DC | PRN

## 2021-10-09 MED ORDER — CEPHALEXIN 500 MG PO CAPS: 500.0000 mg | ORAL_CAPSULE | Freq: Three times a day (TID) | ORAL | 0 refills | Status: AC

## 2021-10-09 NOTE — Progress Notes (Signed)
Douglas Lyons to be D/C'd Home per MD order.  Discussed prescriptions and follow up appointments with the patient. Prescriptions given to patient, medication list explained in detail. Pt verbalized understanding.  Allergies as of 10/09/2021       Reactions   Rosuvastatin Other (See Comments)   Other reaction(s): Muscle Pain Is taking every other day and he feels much better as of 12/30/2016 Is taking every other day and he feels much better as of 12/30/2016 Is taking every other day and he feels much better as of 12/30/2016 Other reaction(s): Muscle Pain Is taking every other day and he feels much better as of 12/30/2016 Is taking every other day and he feels much better as of 12/30/2016        Medication List     STOP taking these medications    senna-docusate 8.6-50 MG tablet Commonly known as: Senokot-S       TAKE these medications    acetaminophen 650 MG CR tablet Commonly known as: TYLENOL Take 650 mg by mouth in the morning and at bedtime.   acetaminophen 500 MG tablet Commonly known as: TYLENOL Take by mouth.   aspirin EC 81 MG tablet Take 81 mg by mouth daily. Swallow whole.   cephALEXin 500 MG capsule Commonly known as: KEFLEX Take 1 capsule (500 mg total) by mouth 3 (three) times daily for 7 days.   cholecalciferol 25 MCG (1000 UNIT) tablet Commonly known as: VITAMIN D3 Take 1,000 Units by mouth daily.   enalapril 5 MG tablet Commonly known as: VASOTEC Take 1 tablet by mouth daily.   glipiZIDE-metformin 2.5-500 MG tablet Commonly known as: METAGLIP Take 1 tablet by mouth 2 (two) times daily before a meal.   ondansetron 4 MG tablet Commonly known as: ZOFRAN Take 1 tablet (4 mg total) by mouth every 6 (six) hours as needed for nausea.   pantoprazole 40 MG tablet Commonly known as: PROTONIX Take 1 tablet (40 mg total) by mouth daily. Start taking on: October 10, 2021   rosuvastatin 20 MG tablet Commonly known as: CRESTOR Take 20 mg by mouth  daily.   traMADol 50 MG tablet Commonly known as: ULTRAM Take 1 tablet (50 mg total) by mouth every 6 (six) hours as needed for severe pain.   zinc gluconate 3.75 mg/mL Soln Take 1 tablet by mouth daily.        Vitals:   10/08/21 2026 10/09/21 0830  BP: (!) 142/73 (!) 141/87  Pulse: 76 77  Resp: 16 20  Temp: 98.6 F (37 C) 97.6 F (36.4 C)  SpO2: 100% 98%    Skin clean, dry and intact without evidence of skin break down, no evidence of skin tears noted. IV catheter discontinued intact. Site without signs and symptoms of complications. Dressing and pressure applied. Pt denies pain at this time. No complaints noted.  An After Visit Summary was printed and given to the patient. Patient escorted via [place], and D/C home via private auto.  [name]

## 2021-10-09 NOTE — Discharge Summary (Signed)
Physician Discharge Summary   Patient: Douglas Lyons MRN: [id number] DOB: 1946/05/22  Admit date:     10/06/2021  Discharge date: 10/09/21  Discharge Physician: Aline August   PCP: Sallee Lange, NP   Recommendations at discharge:   Follow-up with PCP within a week with repeat CBC/BMP Follow-up in the ED if symptoms worsen or new appear   Hospital Course: 76 year old male with history of hypertension, hyperlipidemia, history of DVT having completed anticoagulation course, GERD, erectile dysfunction, non-insulin-dependent diabetes mellitus presented with low back pain.  Patient, Tmax was 101.4 with sodium of 125, potassium of 3.6, creatinine of 1.24 and WBC of 11.3.  COVID-19 and influenza testing were negative.  He was started on IV fluids and antibiotics for possible pyelonephritis.  Urine culture grew Staphylococcus aureus.  His symptoms have improved with IV Rocephin.  GI was consulted for possible melena but this has been ruled out.  She feels much better and wants to go home today.  He will be discharged home today on oral Keflex.  Assessment and Plan: * Pyelonephritis- (present on admission) - CT renal stone study showed features of pyelonephritis, more on the right side. -Currently on Rocephin and symptoms have improved.  Urine culture grows Staph aureus. -Flank pain much improved.  Currently tolerating diet.  Wants to go home today.  Discharge patient home today on Keflex for 7 more days.     Sepsis secondary to UTI [facility])- (present on admission) - Presented with fever, tachycardia, leukocytosis, borderline blood pressure on the lower side due to acute pyelonephritis. -Urine cultures growing Staph aureus.  Antibiotics as above.  Off IV fluids. -Sepsis has resolved  Black stools -Patient apparently had black stools secondary to Pepto-Bismol as per GI evaluation.  Continue Protonix 40 mg once a day.  No need for any further GI work-up needed.    Hypertension- (present on  admission) - Blood pressure currently stable.  Resume home regimen.  Hyponatremia- (present on admission) - Possibly from poor oral intake and dehydration.   -Encourage oral intake.  Sodium 132 today.  Outpatient follow-up.  Hyperlipidemia- (present on admission) - Continue statin   History of DVT (deep vein thrombosis) - Completed anticoagulation course   Diabetes mellitus, type 2 ([place]) Diabetes mellitus type 2 with hyperglycemia -Carb modified diet.  Continue home regimen.  Leukocytosis- (present on admission) - Resolved.   Physical deconditioning -Patient tolerated PT eval.  Possible anemia of chronic disease -From chronic illnesses.  Hemoglobin stable.  Outpatient follow-up        Consultants: GI Procedures performed: None Disposition: Home Diet recommendation:  Discharge Diet Orders (From admission, onward)     Start     Ordered   10/09/21 0000  Diet - low sodium heart healthy        10/09/21 0932           Cardiac diet  DISCHARGE MEDICATION: Allergies as of 10/09/2021       Reactions   Rosuvastatin Other (See Comments)   Other reaction(s): Muscle Pain Is taking every other day and he feels much better as of 12/30/2016 Is taking every other day and he feels much better as of 12/30/2016 Is taking every other day and he feels much better as of 12/30/2016 Other reaction(s): Muscle Pain Is taking every other day and he feels much better as of 12/30/2016 Is taking every other day and he feels much better as of 12/30/2016        Medication List  STOP taking these medications    senna-docusate 8.6-50 MG tablet Commonly known as: Senokot-S       TAKE these medications    acetaminophen 650 MG CR tablet Commonly known as: TYLENOL Take 650 mg by mouth in the morning and at bedtime.   acetaminophen 500 MG tablet Commonly known as: TYLENOL Take by mouth.   aspirin EC 81 MG tablet Take 81 mg by mouth daily. Swallow whole.   cephALEXin  500 MG capsule Commonly known as: KEFLEX Take 1 capsule (500 mg total) by mouth 3 (three) times daily for 7 days.   cholecalciferol 25 MCG (1000 UNIT) tablet Commonly known as: VITAMIN D3 Take 1,000 Units by mouth daily.   enalapril 5 MG tablet Commonly known as: VASOTEC Take 1 tablet by mouth daily.   glipiZIDE-metformin 2.5-500 MG tablet Commonly known as: METAGLIP Take 1 tablet by mouth 2 (two) times daily before a meal.   ondansetron 4 MG tablet Commonly known as: ZOFRAN Take 1 tablet (4 mg total) by mouth every 6 (six) hours as needed for nausea.   pantoprazole 40 MG tablet Commonly known as: PROTONIX Take 1 tablet (40 mg total) by mouth daily. Start taking on: October 10, 2021   rosuvastatin 20 MG tablet Commonly known as: CRESTOR Take 20 mg by mouth daily.   traMADol 50 MG tablet Commonly known as: ULTRAM Take 1 tablet (50 mg total) by mouth every 6 (six) hours as needed for severe pain.   zinc gluconate 3.75 mg/mL Soln Take 1 tablet by mouth daily.        Follow-up Information     Gauger, Victoriano Lain, NP. Schedule an appointment as soon as possible for a visit in 1 week(s).   Specialty: Internal Medicine Contact information: [place] [id number] [phone]                 Discharge Exam: Filed Weights   10/06/21 1321 10/06/21 2020  Weight: 99.8 kg 93.9 kg   General: On room air currently.  No distress.   respiratory: Decreased breath sounds at bases bilaterally, no wheezing CVS: Rate controlled, S1-S2 heard Abdominal: Soft, nontender, mildly distended; no organomegaly, bowel sounds are heard  extremities: Trace lower extremity edema; no clubbing    Condition at discharge: good  The results of significant diagnostics from this hospitalization (including imaging, microbiology, ancillary and laboratory) are listed below for reference.   Imaging Studies: DG Lumbar Spine Complete  Result Date: 10/06/2021 CLINICAL  DATA:  Pt c/o lower back pain that has been going on a while but got worse on Wednesday. Denies strenuous work. Pain worse with twisting motions now with limited ROM. EXAM: LUMBAR SPINE - COMPLETE 4+ VIEW COMPARISON:  None. FINDINGS: No fracture, bone lesion or spondylolisthesis. Mild loss of disc height at L4-L5. Minimal loss of disc height at L2-L3. Remaining disc spaces are well preserved. There are anterior endplate osteophytes throughout the visualized spine. Mild facet degenerative changes at L4-L5 and L5-S1, greatest on the right at L4-L5. Scattered atherosclerotic calcifications in the abdominal aorta. Soft tissues otherwise unremarkable. IMPRESSION: 1. No fracture or acute finding. 2. Mild degenerative changes as detailed above. Electronically Signed   By: Lajean Manes M.D.   On: 10/06/2021 16:03   CT L-SPINE NO CHARGE  Result Date: 10/06/2021 CLINICAL DATA:  Pain EXAM: CT LUMBAR SPINE WITHOUT CONTRAST TECHNIQUE: Multidetector CT imaging of the lumbar spine was performed without intravenous contrast administration. Multiplanar CT image reconstructions were also generated. RADIATION  DOSE REDUCTION: This exam was performed according to the departmental dose-optimization program which includes automated exposure control, adjustment of the mA and/or kV according to patient size and/or use of iterative reconstruction technique. COMPARISON:  None. FINDINGS: Segmentation: 5 lumbar type vertebrae. Alignment: Trace retrolisthesis of L2 on L3. Minimal grade 1 anterolisthesis of L4 on L5. Vertebrae: No acute fracture or focal pathologic process. Paraspinal and other soft tissues: No paraspinal masses or fluid collections. Disc levels: Mild intervertebral disc height loss with endplate spurring most pronounced at L2-3 and L4-5. Severe bilateral facet arthropathy of L4-5 and L5-S1 with suspected high-grade foraminal stenosis bilaterally at L4-5. At least moderate canal stenosis is suspected at L3-4 and L4-5.  IMPRESSION: 1. No acute fracture or traumatic malalignment of the lumbar spine. 2. Multilevel degenerative changes of the lumbar spine, as above. Electronically Signed   By: Davina Poke D.O.   On: 10/06/2021 16:59   CT Renal Stone Study  Result Date: 10/06/2021 CLINICAL DATA:  Flank pain, kidney stone suspected EXAM: CT ABDOMEN AND PELVIS WITHOUT CONTRAST TECHNIQUE: Multidetector CT imaging of the abdomen and pelvis was performed following the standard protocol without IV contrast. RADIATION DOSE REDUCTION: This exam was performed according to the departmental dose-optimization program which includes automated exposure control, adjustment of the mA and/or kV according to patient size and/or use of iterative reconstruction technique. COMPARISON:  None. FINDINGS: Lower chest: There are a few small subpleural nodules within the bilateral lung bases, largest measuring 6 mm at the posterior aspect of the left lower lobe (series 4, image 17). Heart size is normal. Coronary artery atherosclerosis. Hepatobiliary: Unremarkable unenhanced appearance of the liver. No focal liver lesion identified. Gallbladder within normal limits. No hyperdense gallstone. No biliary dilatation. Pancreas: Unremarkable. No pancreatic ductal dilatation or surrounding inflammatory changes. Spleen: Normal in size without focal abnormality. Adrenals/Urinary Tract: Unremarkable adrenal glands. No evidence of solid renal lesion, stone, or hydronephrosis. Minimal nonspecific bilateral perinephric stranding is slightly more pronounced on the right. Urinary bladder wall appears minimally thickened. Stomach/Bowel: Stomach is within normal limits. Moderate volume of stool throughout the colon. No evidence of bowel wall thickening, distention, or inflammatory changes. Vascular/Lymphatic: Scattered aortoiliac atherosclerotic calcifications without aneurysm. No abdominopelvic lymphadenopathy. Reproductive: Mildly enlarged prostate gland. Other: No  free fluid. No abdominopelvic fluid collection. No pneumoperitoneum. No abdominal wall hernia. Musculoskeletal: No acute or significant osseous findings. Facet-predominant lower lumbar spondylosis. IMPRESSION: 1. No evidence of obstructive uropathy. 2. Minimal nonspecific bilateral perinephric stranding is slightly more pronounced on the right. Correlate with urinalysis to exclude the possibility of ascending urinary tract infection. 3. Urinary bladder wall is minimally thickened which could reflect changes related to cystitis or outlet obstruction. 4. Mildly enlarged prostate gland. 5. Moderate volume of stool throughout the colon. 6. Multiple small subpleural nodules within the bilateral lung bases, largest measuring 6 mm at the posterior aspect of the left lower lobe. Recommend a non-contrast Chest CT at 3-6 months. If patient is high risk for malignancy, recommend an additional non-contrast Chest CT at 18-24 months; if patient is low risk for malignancy a non-contrast Chest CT at 18-24 months is optional. These guidelines do not apply to immunocompromised patients and patients with cancer. Follow up in patients with significant comorbidities as clinically warranted. For lung cancer screening, adhere to Lung-RADS guidelines. Reference: Radiology. 2017; 284(1):228-43. Aortic Atherosclerosis (ICD10-I70.0). Electronically Signed   By: Davina Poke D.O.   On: 10/06/2021 17:08    Microbiology: Results for orders placed or performed during the hospital encounter  of 10/06/21  Resp Panel by RT-PCR (Flu A&B, Covid) Nasopharyngeal Swab     Status: None   Collection Time: 10/06/21  2:56 PM   Specimen: Nasopharyngeal Swab; Nasopharyngeal(NP) swabs in vial transport medium  Result Value Ref Range Status   SARS Coronavirus 2 by RT PCR NEGATIVE NEGATIVE Final    Comment: (NOTE) SARS-CoV-2 target nucleic acids are NOT DETECTED.  The SARS-CoV-2 RNA is generally detectable in upper respiratory specimens during the  acute phase of infection. The lowest concentration of SARS-CoV-2 viral copies this assay can detect is 138 copies/mL. A negative result does not preclude SARS-Cov-2 infection and should not be used as the sole basis for treatment or other patient management decisions. A negative result may occur with  improper specimen collection/handling, submission of specimen other than nasopharyngeal swab, presence of viral mutation(s) within the areas targeted by this assay, and inadequate number of viral copies(<138 copies/mL). A negative result must be combined with clinical observations, patient history, and epidemiological information. The expected result is Negative.  Fact Sheet for Patients:  EntrepreneurPulse.com.au  Fact Sheet for Healthcare Providers:  IncredibleEmployment.be  This test is no t yet approved or cleared by the [place] FDA and  has been authorized for detection and/or diagnosis of SARS-CoV-2 by FDA under an Emergency Use Authorization (EUA). This EUA will remain  in effect (meaning this test can be used) for the duration of the COVID-19 declaration under Section 564(b)(1) of the Act, 21 U.S.C.section 360bbb-3(b)(1), unless the authorization is terminated  or revoked sooner.       Influenza A by PCR NEGATIVE NEGATIVE Final   Influenza B by PCR NEGATIVE NEGATIVE Final    Comment: (NOTE) The Xpert Xpress SARS-CoV-2/FLU/RSV plus assay is intended as an aid in the diagnosis of influenza from Nasopharyngeal swab specimens and should not be used as a sole basis for treatment. Nasal washings and aspirates are unacceptable for Xpert Xpress SARS-CoV-2/FLU/RSV testing.  Fact Sheet for Patients: EntrepreneurPulse.com.au  Fact Sheet for Healthcare Providers: IncredibleEmployment.be  This test is not yet approved or cleared by the [place] FDA and has been authorized for detection and/or  diagnosis of SARS-CoV-2 by FDA under an Emergency Use Authorization (EUA). This EUA will remain in effect (meaning this test can be used) for the duration of the COVID-19 declaration under Section 564(b)(1) of the Act, 21 U.S.C. section 360bbb-3(b)(1), unless the authorization is terminated or revoked.  Performed at [facility], [address] [place], [place] [address]   Urine Culture     Status: Abnormal   Collection Time: 10/06/21  6:11 PM   Specimen: Urine, Random  Result Value Ref Range Status   Specimen Description   Final    URINE, RANDOM Performed at [facility], [address] [place], [place] [address]    Special Requests   Final    NONE Performed at [facility], [address] [place], [place] [address]    Culture >=100,000 COLONIES/mL STAPHYLOCOCCUS AUREUS (A)  Final   Report Status 10/09/2021 FINAL  Final   Organism ID, Bacteria STAPHYLOCOCCUS AUREUS (A)  Final      Susceptibility   Staphylococcus aureus - MIC*    CIPROFLOXACIN <=0.5 SENSITIVE Sensitive     GENTAMICIN <=0.5 SENSITIVE Sensitive     NITROFURANTOIN <=16 SENSITIVE Sensitive     OXACILLIN 0.5 SENSITIVE Sensitive     TETRACYCLINE <=1 SENSITIVE Sensitive     VANCOMYCIN 1 SENSITIVE Sensitive     TRIMETH/SULFA <=10 SENSITIVE Sensitive  CLINDAMYCIN <=0.25 SENSITIVE Sensitive     RIFAMPIN <=0.5 SENSITIVE Sensitive     Inducible Clindamycin NEGATIVE Sensitive     * >=100,000 COLONIES/mL STAPHYLOCOCCUS AUREUS    Labs: CBC: Recent Labs  Lab 10/06/21 1811 10/07/21 0423 10/08/21 0349 10/09/21 0410  WBC 11.3* 11.3* 10.0 8.8  NEUTROABS 9.3* 9.1* 8.2* 6.8  HGB 12.3* 12.2* 10.7* 10.7*  HCT 35.5* 35.6* 32.2* 31.8*  MCV 89.2 89.7 91.7 89.8  PLT 194 190 197 488   Basic Metabolic Panel: Recent Labs  Lab 10/06/21 1811 10/07/21 0423 10/08/21 0349 10/09/21 0410  NA 125* 131* 129* 132*  K 3.6 3.4* 4.1 4.4  CL 93* 97* 101 102  CO2 23 23 20* 26  GLUCOSE 219* 193*  161* 203*  BUN 17 18 17 11   CREATININE 1.04 1.06 0.79 0.93  CALCIUM 7.9* 7.7* 7.6* 7.9*  MG 1.9  --  2.0 2.0  PHOS  --  2.1*  --   --    Liver Function Tests: Recent Labs  Lab 10/06/21 1811  AST 17  ALT 18  ALKPHOS 62  BILITOT 0.8  PROT 6.0*  ALBUMIN 2.8*   CBG: Recent Labs  Lab 10/08/21 0829 10/08/21 1234 10/08/21 1712 10/08/21 2035 10/09/21 0832  GLUCAP 155* 272* 158* 166* 197*    Discharge time spent: greater than 30 minutes.  Signed: Aline August, MD [facility] 10/09/2021

## 2023-10-25 DIAGNOSIS — I82409 Acute embolism and thrombosis of unspecified deep veins of unspecified lower extremity: Secondary | ICD-10-CM

## 2023-10-25 HISTORY — DX: Acute embolism and thrombosis of unspecified deep veins of unspecified lower extremity: I82.409

## 2023-11-23 ENCOUNTER — Emergency Department

## 2023-11-23 ENCOUNTER — Inpatient Hospital Stay
Admission: EM | Admit: 2023-11-23 | Discharge: 2023-11-26 | DRG: 482 | Disposition: A | Discharge status: Skilled Nursing Facility | Attending: Osteopathic Medicine | Admitting: Osteopathic Medicine

## 2023-11-23 ENCOUNTER — Encounter: Payer: Self-pay | Admitting: Emergency Medicine

## 2023-11-23 ENCOUNTER — Other Ambulatory Visit: Payer: Self-pay

## 2023-11-23 DIAGNOSIS — Z01811 Encounter for preprocedural respiratory examination: Secondary | ICD-10-CM | POA: Diagnosis present

## 2023-11-23 DIAGNOSIS — Z86718 Personal history of other venous thrombosis and embolism: Secondary | ICD-10-CM | POA: Diagnosis not present

## 2023-11-23 DIAGNOSIS — Z87440 Personal history of urinary (tract) infections: Secondary | ICD-10-CM

## 2023-11-23 DIAGNOSIS — I10 Essential (primary) hypertension: Secondary | ICD-10-CM | POA: Diagnosis present

## 2023-11-23 DIAGNOSIS — S72002A Fracture of unspecified part of neck of left femur, initial encounter for closed fracture: Secondary | ICD-10-CM | POA: Diagnosis present

## 2023-11-23 DIAGNOSIS — W010XXA Fall on same level from slipping, tripping and stumbling without subsequent striking against object, initial encounter: Secondary | ICD-10-CM | POA: Diagnosis present

## 2023-11-23 DIAGNOSIS — E663 Overweight: Secondary | ICD-10-CM | POA: Diagnosis present

## 2023-11-23 DIAGNOSIS — E119 Type 2 diabetes mellitus without complications: Secondary | ICD-10-CM | POA: Diagnosis present

## 2023-11-23 DIAGNOSIS — Z79899 Other long term (current) drug therapy: Secondary | ICD-10-CM | POA: Diagnosis not present

## 2023-11-23 DIAGNOSIS — Z981 Arthrodesis status: Secondary | ICD-10-CM

## 2023-11-23 DIAGNOSIS — D649 Anemia, unspecified: Secondary | ICD-10-CM | POA: Diagnosis present

## 2023-11-23 DIAGNOSIS — W19XXXA Unspecified fall, initial encounter: Secondary | ICD-10-CM

## 2023-11-23 DIAGNOSIS — Z9852 Vasectomy status: Secondary | ICD-10-CM

## 2023-11-23 DIAGNOSIS — E785 Hyperlipidemia, unspecified: Secondary | ICD-10-CM | POA: Diagnosis present

## 2023-11-23 DIAGNOSIS — Z7982 Long term (current) use of aspirin: Secondary | ICD-10-CM | POA: Diagnosis not present

## 2023-11-23 DIAGNOSIS — K219 Gastro-esophageal reflux disease without esophagitis: Secondary | ICD-10-CM | POA: Diagnosis present

## 2023-11-23 DIAGNOSIS — Z888 Allergy status to other drugs, medicaments and biological substances status: Secondary | ICD-10-CM

## 2023-11-23 DIAGNOSIS — Z791 Long term (current) use of non-steroidal anti-inflammatories (NSAID): Secondary | ICD-10-CM | POA: Diagnosis not present

## 2023-11-23 DIAGNOSIS — Z6830 Body mass index (BMI) 30.0-30.9, adult: Secondary | ICD-10-CM | POA: Diagnosis not present

## 2023-11-23 DIAGNOSIS — Y9301 Activity, walking, marching and hiking: Secondary | ICD-10-CM | POA: Diagnosis present

## 2023-11-23 DIAGNOSIS — E1165 Type 2 diabetes mellitus with hyperglycemia: Secondary | ICD-10-CM

## 2023-11-23 DIAGNOSIS — Z7984 Long term (current) use of oral hypoglycemic drugs: Secondary | ICD-10-CM | POA: Diagnosis not present

## 2023-11-23 DIAGNOSIS — S72142A Displaced intertrochanteric fracture of left femur, initial encounter for closed fracture: Principal | ICD-10-CM | POA: Diagnosis present

## 2023-11-23 DIAGNOSIS — Y92009 Unspecified place in unspecified non-institutional (private) residence as the place of occurrence of the external cause: Secondary | ICD-10-CM

## 2023-11-23 LAB — CBC WITH DIFFERENTIAL/PLATELET
Abs Immature Granulocytes: 0.04 10*3/uL (ref 0.00–0.07)
Basophils Absolute: 0 10*3/uL (ref 0.0–0.1)
Basophils Relative: 0 %
Eosinophils Absolute: 0 10*3/uL (ref 0.0–0.5)
Eosinophils Relative: 0 %
HCT: 37.1 % — ABNORMAL LOW (ref 39.0–52.0)
Hemoglobin: 11.8 g/dL — ABNORMAL LOW (ref 13.0–17.0)
Immature Granulocytes: 0 %
Lymphocytes Relative: 5 %
Lymphs Abs: 0.6 10*3/uL — ABNORMAL LOW (ref 0.7–4.0)
MCH: 27.7 pg (ref 26.0–34.0)
MCHC: 31.8 g/dL (ref 30.0–36.0)
MCV: 87.1 fL (ref 80.0–100.0)
Monocytes Absolute: 1.1 10*3/uL — ABNORMAL HIGH (ref 0.1–1.0)
Monocytes Relative: 9 %
Neutro Abs: 10 10*3/uL — ABNORMAL HIGH (ref 1.7–7.7)
Neutrophils Relative %: 86 %
Platelets: 209 10*3/uL (ref 150–400)
RBC: 4.26 MIL/uL (ref 4.22–5.81)
RDW: 14.1 % (ref 11.5–15.5)
WBC: 11.8 10*3/uL — ABNORMAL HIGH (ref 4.0–10.5)
nRBC: 0 % (ref 0.0–0.2)

## 2023-11-23 LAB — COMPREHENSIVE METABOLIC PANEL WITH GFR
ALT: 14 U/L (ref 0–44)
AST: 16 U/L (ref 15–41)
Albumin: 3.6 g/dL (ref 3.5–5.0)
Alkaline Phosphatase: 51 U/L (ref 38–126)
Anion gap: 8 (ref 5–15)
BUN: 20 mg/dL (ref 8–23)
CO2: 22 mmol/L (ref 22–32)
Calcium: 8.9 mg/dL (ref 8.9–10.3)
Chloride: 106 mmol/L (ref 98–111)
Creatinine, Ser: 1.01 mg/dL (ref 0.61–1.24)
GFR, Estimated: >60 mL/min (ref >60)
Glucose, Bld: 294 mg/dL — ABNORMAL HIGH (ref 70–99)
Potassium: 5.4 mmol/L — ABNORMAL HIGH (ref 3.5–5.1)
Sodium: 136 mmol/L (ref 135–145)
Total Bilirubin: 0.6 mg/dL (ref 0.0–1.2)
Total Protein: 6.1 g/dL — ABNORMAL LOW (ref 6.5–8.1)

## 2023-11-23 LAB — CBG MONITORING, ED: Glucose-Capillary: 219 mg/dL — ABNORMAL HIGH (ref 70–99)

## 2023-11-23 MED ORDER — MORPHINE SULFATE (PF) 2 MG/ML IV SOLN
1.0000 mg | INTRAVENOUS | Status: DC | PRN
  Administered 2023-11-24 (×2): 1 mg via INTRAVENOUS
  Filled 2023-11-23 (×2): qty 1

## 2023-11-23 MED ORDER — CEFAZOLIN SODIUM-DEXTROSE 2-4 GM/100ML-% IV SOLN
2.0000 g | INTRAVENOUS | Status: AC
  Administered 2023-11-24: 2 g via INTRAVENOUS
  Filled 2023-11-23: qty 100

## 2023-11-23 MED ORDER — INSULIN ASPART 100 UNIT/ML IJ SOLN
0.0000 [IU] | INTRAMUSCULAR | Status: DC
  Administered 2023-11-23 – 2023-11-24 (×2): 5 [IU] via SUBCUTANEOUS
  Administered 2023-11-24 (×3): 3 [IU] via SUBCUTANEOUS
  Administered 2023-11-25 (×4): 5 [IU] via SUBCUTANEOUS
  Administered 2023-11-25 – 2023-11-26 (×3): 3 [IU] via SUBCUTANEOUS
  Administered 2023-11-26: 5 [IU] via SUBCUTANEOUS
  Filled 2023-11-23 (×13): qty 1

## 2023-11-23 MED ORDER — ENALAPRIL MALEATE 5 MG PO TABS
5.0000 mg | ORAL_TABLET | Freq: Every day | ORAL | Status: DC
  Administered 2023-11-24: 5 mg via ORAL
  Filled 2023-11-23 (×3): qty 1

## 2023-11-23 MED ORDER — FENTANYL CITRATE PF 50 MCG/ML IJ SOSY
50.0000 ug | PREFILLED_SYRINGE | Freq: Once | INTRAMUSCULAR | Status: AC
  Administered 2023-11-23: 50 ug via INTRAVENOUS
  Filled 2023-11-23: qty 1

## 2023-11-23 MED ORDER — METHOCARBAMOL 500 MG PO TABS
500.0000 mg | ORAL_TABLET | Freq: Four times a day (QID) | ORAL | Status: DC | PRN
  Administered 2023-11-24 – 2023-11-26 (×2): 500 mg via ORAL
  Filled 2023-11-23 (×2): qty 1

## 2023-11-23 MED ORDER — METHOCARBAMOL 1000 MG/10ML IJ SOLN
500.0000 mg | Freq: Four times a day (QID) | INTRAMUSCULAR | Status: DC | PRN
  Administered 2023-11-24: 500 mg via INTRAVENOUS
  Filled 2023-11-23 (×2): qty 5

## 2023-11-23 MED ORDER — ONDANSETRON HCL 4 MG/2ML IJ SOLN
4.0000 mg | Freq: Once | INTRAMUSCULAR | Status: AC
Start: 2023-11-23 — End: 2023-11-23
  Administered 2023-11-23: 4 mg via INTRAVENOUS
  Filled 2023-11-23: qty 2

## 2023-11-23 MED ORDER — ROSUVASTATIN CALCIUM 10 MG PO TABS
20.0000 mg | ORAL_TABLET | Freq: Every day | ORAL | Status: DC
  Administered 2023-11-24 – 2023-11-26 (×3): 20 mg via ORAL
  Filled 2023-11-23 (×2): qty 2
  Filled 2023-11-23: qty 1

## 2023-11-23 MED ORDER — HYDROCODONE-ACETAMINOPHEN 5-325 MG PO TABS
1.0000 | ORAL_TABLET | Freq: Four times a day (QID) | ORAL | Status: DC | PRN
  Administered 2023-11-24 – 2023-11-25 (×2): 1 via ORAL
  Administered 2023-11-25: 2 via ORAL
  Filled 2023-11-23: qty 1
  Filled 2023-11-23: qty 2
  Filled 2023-11-23: qty 1

## 2023-11-23 MED ORDER — CEFAZOLIN (ANCEF) 1 G IV SOLR
2.0000 g | INTRAVENOUS | Status: DC
  Filled 2023-11-23: qty 2

## 2023-11-23 NOTE — Assessment & Plan Note (Signed)
 Continue rosuvastatin.

## 2023-11-23 NOTE — Assessment & Plan Note (Addendum)
 SCD for DVT prophylaxis and Lovenox starting 24 hours postop or per orthopedic preference

## 2023-11-23 NOTE — ED Triage Notes (Signed)
 Patient to ED via POV after a fall. 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 C/o left hip pain.

## 2023-11-23 NOTE — Assessment & Plan Note (Signed)
 Sliding scale insulin coverage

## 2023-11-23 NOTE — Consult Note (Signed)
 Ortho Note  78 [place] tree trunk and then fell down onto his left hip. He was unable to get up and layed on the ground for an hour. He was eventually moved to his friend's truck and taken home. His wife drove him to the hospital . He presented with an obvious deformity and was c/o of left hip pain. Xrays were obtained and they revealed a displaced right IT hip fracture. An Orthopedic consult was requested. Prior to the fall the patient ambulated without any assistive devices.  PMH: Htn, DM, hx of Dvt, Gerd  PSH: Cervical spine fusion   Allergies: Rosuvastatin  SH: He lives with his wife. He still works full time as a Production designer, theatre/television/film man . He denies the use of ETOH or tobacco  Meds: aspirin 81 mg, Glipizide/Metformin, lasix, vasotec    PE  The patient is alert and oriented X#  Heent unremarkable  Left leg is shortened and externally rotated. He is able to full DF his ankle. Pulse intact. He has pain with attempted movement of the left hip. There is no ecchymosis of the left hip area.   Xrays reveal a displaced Left IT hip fracture  Labs: wbc 11.8  Hb 11.8  Hct 37.1  K 5.4  Glu 294  HbA1c 7.0  Imp: Displaced left IT hip fracture  Plan: Npo after midnight. The patient will be placed on the OR schedule for surgery tomorrow with the on call doctor for ORIF of left hip fx using a rod. The potential risks and complications of the surgery were explained to the patient and his wife. They include infection, nerve or blood vessel damage, bleeding, possible need for a blood transfusion, wound problems, blood clots, failure of fixation and the need for additional surgery, nonunion, malunion, shortening of the leg, persistent pain with a rotational deformity. The patient appears to understand the risks of the surgery and wishes to proceed with the surgery.

## 2023-11-23 NOTE — H&P (Signed)
 History and Physical    Patient: Douglas Lyons WUJ:[id number] DOB: July 19, 1946 DOA: 11/23/2023 DOS: the patient was seen and examined on 11/23/2023 PCP: Myrene Buddy, NP  Patient coming from: Home  Chief Complaint:  Chief Complaint  Patient presents with   Fall    HPI: Douglas Lyons is a 78 y.o. 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  He denies hitting his head and had loss of he is not currently on anticoagulation. ED course and data review: Vitals within normal limits Labs notable for potassium of 5.4, glucose 294, WBC 11,800, hemoglobin 11.8 EKG not done cX-rayNo active disease Hip x-ray showing severely displaced and comminuted intertrochanteric fracture of the proximal left femur. Treated with fentanyl for pain and given Ancef The ED provider spoke with on-call orthopedist will take patient to the OR in the a.m. Hospitalist consulted for admission.     Past Medical History:  Diagnosis Date   Cataracts, bilateral    Degenerative joint disease of knee, left    Diabetes mellitus, type 2 (HCC)    Diverticulosis    Erectile dysfunction    GERD (gastroesophageal reflux disease)    Heme positive stool    Hyperlipidemia    Hypertension    Lumbar disc herniation    Overweight (BMI 25.0-29.9)    Past Surgical History:  Procedure Laterality Date   ANTERIOR CERVICAL DECOMP/DISCECTOMY FUSION N/A 12/01/2020   Procedure: ANTERIOR CERVICAL DECOMPRESSION/DISCECTOMY FUSION 2 LEVELS C3-4, C4-5;  Surgeon: Lucy Chris, MD;  Location: [facility] ORS;  Service: Neurosurgery;  Laterality: N/A;   COLONOSCOPY     COLONOSCOPY N/A 07/26/2015   Procedure: COLONOSCOPY;  Surgeon: Scot Jun, MD;  Location: [facility] ENDOSCOPY;  Service: Endoscopy;  Laterality: N/A;   COLONOSCOPY N/A  09/29/2020   Procedure: COLONOSCOPY;  Surgeon: Regis Bill, MD;  Location: [facility] ENDOSCOPY;  Service: Endoscopy;  Laterality: N/A;   SHOULDER ARTHROSCOPY Right    VASECTOMY     Social History:  reports that he has never smoked. He has never used smokeless tobacco. He reports current alcohol use. He reports that he does not use drugs.  Allergies  Allergen Reactions   Rosuvastatin Other (See Comments)    Other reaction(s): Muscle Pain Is taking every other day and he feels much better as of 12/30/2016 Is taking every other day and he feels much better as of 12/30/2016  Is taking every other day and he feels much better as of 12/30/2016 Other reaction(s): Muscle Pain Is taking every other day and he feels much better as of 12/30/2016 Is taking every other day and he feels much better as of 12/30/2016    Family History  Problem Relation Age of Onset   Cancer Father     Prior to Admission medications   Medication Sig Start Date End Date Taking? Authorizing Provider  acetaminophen (TYLENOL) 500 MG tablet Take by mouth.    [provider]  acetaminophen (TYLENOL) 650 MG CR tablet Take 650 mg by mouth in the morning and at bedtime.    [provider]  aspirin EC 81 MG tablet Take 81 mg by mouth daily. Swallow whole.    [provider]  cholecalciferol (VITAMIN D3) 25 MCG (1000 UNIT) tablet Take 1,000 Units by mouth daily.    [provider]  enalapril (VASOTEC) 5 MG tablet Take 1 tablet by mouth daily. 01/29/21   [provider]  glipiZIDE-metformin (METAGLIP) 2.5-500 MG tablet Take 1 tablet by mouth 2 (two) times daily before a meal.    [provider]  ondansetron (ZOFRAN) 4 MG tablet Take 1 tablet (4 mg total) by mouth every 6 (six) hours as needed for nausea. 10/09/21   Glade Lloyd, MD  pantoprazole (PROTONIX) 40 MG tablet Take 1 tablet (40 mg total) by mouth daily. 10/10/21   Glade Lloyd, MD  rosuvastatin (CRESTOR) 20 MG  tablet Take 20 mg by mouth daily.    [provider]  traMADol (ULTRAM) 50 MG tablet Take 1 tablet (50 mg total) by mouth every 6 (six) hours as needed for severe pain. 10/09/21   Glade Lloyd, MD  zinc gluconate 3.75 mg/mL SOLN Take 1 tablet by mouth daily.    [provider]    Physical Exam: Vitals:   11/23/23 1616 11/23/23 1934  BP: (!) 157/85 136/75  Pulse: 96 70  Resp: 18 18  Temp: 98 F (36.7 C) 97.7 F (36.5 C)  TempSrc: Oral Oral  SpO2: 97% 98%  Weight: 99.8 kg   Height: 5\' 11"  (1.803 m)    Physical Exam Vitals and nursing note reviewed.  Constitutional:      General: He is not in acute distress. HENT:     Head: Normocephalic and atraumatic.  Cardiovascular:     Rate and Rhythm: Normal rate and regular rhythm.     Heart sounds: Normal heart sounds.  Pulmonary:     Effort: Pulmonary effort is normal.     Breath sounds: Normal breath sounds.  Abdominal:     Palpations: Abdomen is soft.     Tenderness: There is no abdominal tenderness.  Musculoskeletal:     Comments: Left leg shortening and external rotation  Neurological:     Mental Status: Mental status is at baseline.     Labs on Admission: I have personally reviewed following labs and imaging studies  CBC: Recent Labs  Lab 11/23/23 1841  WBC 11.8*  NEUTROABS 10.0*  HGB 11.8*  HCT 37.1*  MCV 87.1  PLT 209   Basic Metabolic Panel: Recent Labs  Lab 11/23/23 1841  NA 136  K 5.4*  CL 106  CO2 22  GLUCOSE 294*  BUN 20  CREATININE 1.01  CALCIUM 8.9   GFR: Estimated Creatinine Clearance: 71.4 mL/min (by C-G formula based on SCr of 1.01 mg/dL). Liver Function Tests: Recent Labs  Lab 11/23/23 1841  AST 16  ALT 14  ALKPHOS 51  BILITOT 0.6  PROT 6.1*  ALBUMIN 3.6   No results for input(s): "LIPASE", "AMYLASE" in the last 168 hours. No results for input(s): "AMMONIA" in the last 168 hours. Coagulation Profile: No results for input(s): "INR", "PROTIME" in the last 168  hours. Cardiac Enzymes: No results for input(s): "CKTOTAL", "CKMB", "CKMBINDEX", "TROPONINI" in the last 168 hours. BNP (last 3 results) No results for input(s): "PROBNP" in the last 8760 hours. HbA1C: No results for input(s): "HGBA1C" in the last 72 hours. CBG: No results for input(s): "GLUCAP" in the last 168 hours. Lipid Profile: No results for input(s): "CHOL", "HDL", "LDLCALC", "TRIG", "CHOLHDL", "LDLDIRECT" in the last 72 hours. Thyroid Function Tests: No results for input(s): "TSH", "T4TOTAL", "FREET4", "T3FREE", "THYROIDAB" in the last 72 hours. Anemia Panel: No results for input(s): "VITAMINB12", "FOLATE", "FERRITIN", "TIBC", "IRON", "RETICCTPCT" in the last 72 hours. Urine analysis:    Component Value Date/Time  COLORURINE YELLOW (A) 10/06/2021 1454   APPEARANCEUR HAZY (A) 10/06/2021 1454   LABSPEC 1.024 10/06/2021 1454   PHURINE 5.0 10/06/2021 1454   GLUCOSEU 150 (A) 10/06/2021 1454   HGBUR SMALL (A) 10/06/2021 1454   BILIRUBINUR NEGATIVE 10/06/2021 1454   KETONESUR 5 (A) 10/06/2021 1454   PROTEINUR 100 (A) 10/06/2021 1454   NITRITE NEGATIVE 10/06/2021 1454   LEUKOCYTESUR TRACE (A) 10/06/2021 1454    Radiological Exams on Admission: DG Hip Unilat W or Wo Pelvis 2-3 Views Left Result Date: 11/23/2023 CLINICAL DATA:  Left hip pain after fall. EXAM: DG HIP (WITH OR WITHOUT PELVIS) 2-3V LEFT COMPARISON:  None Available. FINDINGS: Severely displaced and comminuted fracture is seen involving the intertrochanteric region of the proximal left femur. IMPRESSION: Severely displaced and comminuted intertrochanteric fracture of proximal left femur. Electronically Signed   By: Lupita Raider M.D.   On: 11/23/2023 17:07   DG Chest 1 View Result Date: 11/23/2023 CLINICAL DATA:  Preop. EXAM: CHEST  1 VIEW COMPARISON:  None Available. FINDINGS: Mild cardiomegaly is noted. Both lungs are clear. The visualized skeletal structures are unremarkable. IMPRESSION: No active disease.  Electronically Signed   By: Lupita Raider M.D.   On: 11/23/2023 17:06     Data Reviewed: Relevant notes from primary care and specialist visits, past discharge summaries as available in EHR, including Care Everywhere. Prior diagnostic testing as pertinent to current admission diagnoses Updated medications and problem lists for reconciliation ED course, including vitals, labs, imaging, treatment and response to treatment Triage notes, nursing and pharmacy notes and ED provider's notes Notable results as noted in HPI   Assessment and Plan: * Closed left hip fracture, initial encounter (HCC) Accidental fall N.p.o. from midnight Pain control Further orders per orthopedic service  History of DVT (deep vein thrombosis) SCD for DVT prophylaxis and Lovenox starting 24 hours postop or per orthopedic preference  Hyperlipidemia Continue rosuvastatin  Hypertension Continue enalapril  Diabetes mellitus, type 2 (HCC) Sliding scale insulin coverage        DVT prophylaxis: SCD  Consults: Orthopedics, Dr. Wilkie Aye  Advance Care Planning:   Code Status: Prior   Family Communication: none  Disposition Plan: Back to previous home environment  Severity of Illness: The appropriate patient status for this patient is INPATIENT. Inpatient status is judged to be reasonable and necessary in order to provide the required intensity of service to ensure the patient's safety. The patient's presenting symptoms, physical exam findings, and initial radiographic and laboratory data in the context of their chronic comorbidities is felt to place them at high risk for further clinical deterioration. Furthermore, it is not anticipated that the patient will be medically stable for discharge from the hospital within 2 midnights of admission.   * I certify that at the point of admission it is my clinical judgment that the patient will require inpatient hospital care spanning beyond 2 midnights from the  point of admission due to high intensity of service, high risk for further deterioration and high frequency of surveillance required.*  Author: Andris Baumann, MD 11/23/2023 10:34 PM  For on call review [email or web address].

## 2023-11-23 NOTE — ED Provider Notes (Signed)
 ----------------------------------------- 7:47 PM on 11/23/2023 -----------------------------------------  Blood pressure 136/75, pulse 70, temperature 97.7 F (36.5 C), temperature source Oral, resp. rate 18, height 5\' 11"  (1.803 m), weight 99.8 kg, SpO2 98%.  Assuming care from Dr. Geanie Cooley , PA-C/NP-C.  In short, Douglas Lyons is a 78 y.o. male with a chief complaint of Fall .  Refer to the original H&P for additional details.  The current plan of care is to await acceptance from ortho for admission. Patient is pain-free and otherwise stable. Acute left hip fracture following a mechanical fall without head injury or LOC. No other complaints.  ____________________________________________    ED Results / Procedures / Treatments   Labs (all labs ordered are listed, but only abnormal results are displayed) Labs Reviewed  COMPREHENSIVE METABOLIC PANEL WITH GFR - Abnormal; Notable for the following components:      Result Value   Potassium 5.4 (*)    Glucose, Bld 294 (*)    Total Protein 6.1 (*)    All other components within normal limits  CBC WITH DIFFERENTIAL/PLATELET - Abnormal; Notable for the following components:   WBC 11.8 (*)    Hemoglobin 11.8 (*)    HCT 37.1 (*)    Neutro Abs 10.0 (*)    Lymphs Abs 0.6 (*)    Monocytes Absolute 1.1 (*)    All other components within normal limits     EKG    RADIOLOGY  I personally viewed and evaluated these images as part of my medical decision making, as well as reviewing the written report by the radiologist.  ED Provider Interpretation: Acute left hip fracture}  DG Hip Unilat W or Wo Pelvis 2-3 Views Left Result Date: 11/23/2023 CLINICAL DATA:  Left hip pain after fall. EXAM: DG HIP (WITH OR WITHOUT PELVIS) 2-3V LEFT COMPARISON:  None Available. FINDINGS: Severely displaced and comminuted fracture is seen involving the intertrochanteric region of the proximal left femur. IMPRESSION: Severely displaced and  comminuted intertrochanteric fracture of proximal left femur. Electronically Signed   By: Lupita Raider M.D.   On: 11/23/2023 17:07   DG Chest 1 View Result Date: 11/23/2023 CLINICAL DATA:  Preop. EXAM: CHEST  1 VIEW COMPARISON:  None Available. FINDINGS: Mild cardiomegaly is noted. Both lungs are clear. The visualized skeletal structures are unremarkable. IMPRESSION: No active disease. Electronically Signed   By: Lupita Raider M.D.   On: 11/23/2023 17:06     PROCEDURES:  Critical Care performed: No  Procedures   MEDICATIONS ORDERED IN ED: Medications  ceFAZolin (ANCEF) IVPB 2g/100 mL premix (has no administration in time range)     IMPRESSION / MDM / ASSESSMENT AND PLAN / ED COURSE  I reviewed the triage vital signs and the nursing notes.                              Differential diagnosis includes, but is not limited to, hip fracture, hip dislocation, femur fracture, contusion, abrasions  Patient's presentation is most consistent with acute presentation with potential threat to life or bodily function.  Patient's diagnosis is consistent with mechanical fall resulting in acute left hip pain and disability.  Patient presents to the ED for evaluation of left hip pain and disability after mechanical fall.  X-ray reveals an acute displaced left hip fracture.  Chest x-ray negative for any acute findings as reviewed by me.  Patient without any other complaint at this time.  No reports  of any head injury or LOC no blood thinner use on board.    ----------------------------------------- 9:58 PM on 11/23/2023 ----------------------------------------- Dan Humphreys, MD, On call locums orthopedic provider, telephone to the nurse to report that she would accept the patient and the patient should be to the hospitalist service.  ----------------------------------------- 10:39 PM on 11/23/2023 ----------------------------------------- Dr. Lindajo Royal will admit the patient to the  hospital service, for planned surgical intervention in the morning.  Patient is aware of the plan and agreeable to the admission.    FINAL CLINICAL IMPRESSION(S) / ED DIAGNOSES   Final diagnoses:  Closed fracture of left hip, initial encounter [facility])  Fall in home, initial encounter     Rx / DC Orders   ED Discharge Orders     None        Note:  This document was prepared using Dragon voice recognition software and may include unintentional dictation errors.    Lissa Hoard, PA-C 11/23/23 2239    Claybon Jabs, MD 11/23/23 [phone]

## 2023-11-23 NOTE — ED Notes (Signed)
 See triage notes. Patient c/o left hip and leg pain secondary to a fall today. No LOC.

## 2023-11-23 NOTE — Assessment & Plan Note (Signed)
 Accidental fall N.p.o. from midnight Pain control Further orders per orthopedic service

## 2023-11-23 NOTE — Assessment & Plan Note (Signed)
Continue enalapril 

## 2023-11-23 NOTE — ED Provider Notes (Signed)
 [facility] Provider Note  Patient Contact: 6:27 PM (approximate)   History   Fall   HPI  Douglas Lyons is a 78 y.o. male who presents the emergency department complaining of left hip pain.  Patient states that he tripped over a root, fell with his hip against a another exposed root.  Patient states that he immediately could not move or bear weight on the left hip.  His left leg is shortened and rotated.  He did not hit his head or lose consciousness.  Patient has had no previous injuries or surgeries to the left hip.     Physical Exam   Triage Vital Signs: ED Triage Vitals [11/23/23 1616]  Encounter Vitals Group     BP (!) 157/85     Systolic BP Percentile      Diastolic BP Percentile      Pulse Rate 96     Resp 18     Temp 98 F (36.7 C)     Temp Source Oral     SpO2 97 %     Weight 220 lb (99.8 kg)     Height 5\' 11"  (1.803 m)     Head Circumference      Peak Flow      Pain Score 10     Pain Loc      Pain Education      Exclude from Growth Chart     Most recent vital signs: Vitals:   11/23/23 1616 11/23/23 1934  BP: (!) 157/85 136/75  Pulse: 96 70  Resp: 18 18  Temp: 98 F (36.7 C) 97.7 F (36.5 C)  SpO2: 97% 98%     General: Alert and in no acute distress.  Cardiovascular:  Good peripheral perfusion Respiratory: Normal respiratory effort without tachypnea or retractions. Lungs CTAB.  Musculoskeletal: Full range of motion to all extremities.  Patient with shortening and rotation of the left lower extremity.  No range of motion is performed at this time.  Pulses in the dorsalis pedis region appreciated.  Sensation intact. Neurologic:  No gross focal neurologic deficits are appreciated.  Skin:   No rash noted Other:   ED Results / Procedures / Treatments   Labs (all labs ordered are listed, but only abnormal results are displayed) Labs Reviewed  COMPREHENSIVE METABOLIC PANEL WITH GFR - Abnormal; Notable for the following  components:      Result Value   Potassium 5.4 (*)    Glucose, Bld 294 (*)    Total Protein 6.1 (*)    All other components within normal limits  CBC WITH DIFFERENTIAL/PLATELET - Abnormal; Notable for the following components:   WBC 11.8 (*)    Hemoglobin 11.8 (*)    HCT 37.1 (*)    Neutro Abs 10.0 (*)    Lymphs Abs 0.6 (*)    Monocytes Absolute 1.1 (*)    All other components within normal limits     EKG     RADIOLOGY  I personally viewed, evaluated, and interpreted these images as part of my medical decision making, as well as reviewing the written report by the radiologist.  ED Provider Interpretation: Severely displaced and comminuted intertrochanteric fracture of the proximal left femur  DG Hip Unilat W or Wo Pelvis 2-3 Views Left Result Date: 11/23/2023 CLINICAL DATA:  Left hip pain after fall. EXAM: DG HIP (WITH OR WITHOUT PELVIS) 2-3V LEFT COMPARISON:  None Available. FINDINGS: Severely displaced and comminuted fracture is seen involving the  intertrochanteric region of the proximal left femur. IMPRESSION: Severely displaced and comminuted intertrochanteric fracture of proximal left femur. Electronically Signed   By: Lupita Raider M.D.   On: 11/23/2023 17:07   DG Chest 1 View Result Date: 11/23/2023 CLINICAL DATA:  Preop. EXAM: CHEST  1 VIEW COMPARISON:  None Available. FINDINGS: Mild cardiomegaly is noted. Both lungs are clear. The visualized skeletal structures are unremarkable. IMPRESSION: No active disease. Electronically Signed   By: Lupita Raider M.D.   On: 11/23/2023 17:06    PROCEDURES:  Critical Care performed: No  Procedures   MEDICATIONS ORDERED IN ED: Medications  ceFAZolin (ANCEF) powder 2 g (has no administration in time range)     IMPRESSION / MDM / ASSESSMENT AND PLAN / ED COURSE  I reviewed the triage vital signs and the nursing notes.                                 Differential diagnosis includes, but is not limited to, hip fracture,  hip contusion, hip dislocation   Patient's presentation is most consistent with acute presentation with potential threat to life or bodily function.   Patient's diagnosis is consistent with hip fracture.  Patient presents to the emergency department after mechanical fall.  He tripped over a root, fell onto another exposed root with the left hip.  Patient had immediate pain, inability to bear weight on the hip.  Patient has shortening and rotation of the left lower extremity.  X-ray confirms fracture with a severely displaced and comminuted intertrochanteric fracture of the proximal left femur.  Pulses and sensation intact.  Patient will have chest x-ray and basic labs.  Will admit the patient.  I discussed the patient with Ortho.  Ortho advises that they will call back after reviewing the patient's information.  Patient care will be transferred to [facility] awaiting Ortho's confirmation that they will take the patient for admission.     FINAL CLINICAL IMPRESSION(S) / ED DIAGNOSES   Final diagnoses:  Pre-op chest exam     Rx / DC Orders   ED Discharge Orders     None        Note:  This document was prepared using Dragon voice recognition software and may include unintentional dictation errors.   Lanette Hampshire 11/23/23 2007    Janith Lima, MD 11/24/23 1504

## 2023-11-24 ENCOUNTER — Other Ambulatory Visit: Payer: Self-pay

## 2023-11-24 ENCOUNTER — Encounter
Admission: EM | Disposition: A | Discharge status: Skilled Nursing Facility | Payer: Self-pay | Source: Home / Self Care | Attending: Internal Medicine

## 2023-11-24 ENCOUNTER — Inpatient Hospital Stay: Admitting: Certified Registered"

## 2023-11-24 ENCOUNTER — Encounter: Payer: Self-pay | Admitting: Internal Medicine

## 2023-11-24 ENCOUNTER — Inpatient Hospital Stay

## 2023-11-24 DIAGNOSIS — S72002A Fracture of unspecified part of neck of left femur, initial encounter for closed fracture: Secondary | ICD-10-CM | POA: Diagnosis not present

## 2023-11-24 HISTORY — PX: INTRAMEDULLARY (IM) NAIL INTERTROCHANTERIC: SHX5875

## 2023-11-24 LAB — GLUCOSE, CAPILLARY
Glucose-Capillary: 198 mg/dL — ABNORMAL HIGH (ref 70–99)
Glucose-Capillary: 176 mg/dL — ABNORMAL HIGH (ref 70–99)

## 2023-11-24 LAB — BASIC METABOLIC PANEL WITH GFR
Anion gap: 8 (ref 5–15)
BUN: 16 mg/dL (ref 8–23)
CO2: 24 mmol/L (ref 22–32)
Calcium: 8.4 mg/dL — ABNORMAL LOW (ref 8.9–10.3)
Chloride: 104 mmol/L (ref 98–111)
Creatinine, Ser: 0.84 mg/dL (ref 0.61–1.24)
GFR, Estimated: >60 mL/min (ref >60)
Glucose, Bld: 195 mg/dL — ABNORMAL HIGH (ref 70–99)
Potassium: 4.4 mmol/L (ref 3.5–5.1)
Sodium: 136 mmol/L (ref 135–145)

## 2023-11-24 LAB — CBC
HCT: 33.7 % — ABNORMAL LOW (ref 39.0–52.0)
Hemoglobin: 10.9 g/dL — ABNORMAL LOW (ref 13.0–17.0)
MCH: 28.2 pg (ref 26.0–34.0)
MCHC: 32.3 g/dL (ref 30.0–36.0)
MCV: 87.3 fL (ref 80.0–100.0)
Platelets: 201 10*3/uL (ref 150–400)
RBC: 3.86 MIL/uL — ABNORMAL LOW (ref 4.22–5.81)
RDW: 14.4 % (ref 11.5–15.5)
WBC: 7 10*3/uL (ref 4.0–10.5)
nRBC: 0 % (ref 0.0–0.2)

## 2023-11-24 LAB — CBG MONITORING, ED
Glucose-Capillary: 212 mg/dL — ABNORMAL HIGH (ref 70–99)
Glucose-Capillary: 194 mg/dL — ABNORMAL HIGH (ref 70–99)
Glucose-Capillary: 158 mg/dL — ABNORMAL HIGH (ref 70–99)
Glucose-Capillary: 173 mg/dL — ABNORMAL HIGH (ref 70–99)
Glucose-Capillary: 167 mg/dL — ABNORMAL HIGH (ref 70–99)

## 2023-11-24 LAB — HEMOGLOBIN A1C
Hgb A1c MFr Bld: 8.2 % — ABNORMAL HIGH (ref 4.8–5.6)
Mean Plasma Glucose: 188.64 mg/dL

## 2023-11-24 LAB — CK: Total CK: 49 U/L (ref 49–397)

## 2023-11-24 SURGERY — FIXATION, FRACTURE, INTERTROCHANTERIC, WITH INTRAMEDULLARY ROD
Anesthesia: Spinal | Site: Hip | Laterality: Left

## 2023-11-24 MED ORDER — DOCUSATE SODIUM 100 MG PO CAPS
100.0000 mg | ORAL_CAPSULE | Freq: Two times a day (BID) | ORAL | Status: DC
Start: 2023-11-24 — End: 2023-11-26
  Administered 2023-11-24 – 2023-11-26 (×4): 100 mg via ORAL
  Filled 2023-11-24 (×4): qty 1

## 2023-11-24 MED ORDER — LACTATED RINGERS IV SOLN: INTRAVENOUS | Status: DC | PRN

## 2023-11-24 MED ORDER — LIDOCAINE HCL URETHRAL/MUCOSAL 2 % EX GEL
CUTANEOUS | Status: DC | PRN
  Administered 2023-11-24: .3 via TOPICAL

## 2023-11-24 MED ORDER — ACETAMINOPHEN 10 MG/ML IV SOLN
INTRAVENOUS | Status: DC | PRN
  Administered 2023-11-24: 1000 mg via INTRAVENOUS

## 2023-11-24 MED ORDER — FENTANYL CITRATE (PF) 100 MCG/2ML IJ SOLN
INTRAMUSCULAR | Status: AC
  Filled 2023-11-24: qty 2

## 2023-11-24 MED ORDER — MAGNESIUM HYDROXIDE 400 MG/5ML PO SUSP: 30.0000 mL | Freq: Every day | ORAL | Status: DC | PRN

## 2023-11-24 MED ORDER — PHENYLEPHRINE HCL-NACL 20-0.9 MG/250ML-% IV SOLN
INTRAVENOUS | Status: DC | PRN
  Administered 2023-11-24: 50 ug/min via INTRAVENOUS
  Administered 2023-11-24: 160 ug via INTRAVENOUS
  Administered 2023-11-24: 240 ug via INTRAVENOUS
  Administered 2023-11-24 (×2): 160 ug via INTRAVENOUS

## 2023-11-24 MED ORDER — KETOROLAC TROMETHAMINE 15 MG/ML IJ SOLN
INTRAMUSCULAR | Status: AC
  Filled 2023-11-24: qty 1

## 2023-11-24 MED ORDER — DROPERIDOL 2.5 MG/ML IJ SOLN: 0.6250 mg | Freq: Once | INTRAMUSCULAR | Status: DC | PRN

## 2023-11-24 MED ORDER — BUPIVACAINE-EPINEPHRINE 0.5% -1:200000 IJ SOLN
INTRAMUSCULAR | Status: DC | PRN
  Administered 2023-11-24: 50 mL via INTRAMUSCULAR

## 2023-11-24 MED ORDER — BUPIVACAINE HCL (PF) 0.5 % IJ SOLN
INTRAMUSCULAR | Status: DC | PRN
  Administered 2023-11-24: 3 mL

## 2023-11-24 MED ORDER — METOCLOPRAMIDE HCL 10 MG PO TABS: 5.0000 mg | ORAL_TABLET | Freq: Three times a day (TID) | ORAL | Status: DC | PRN

## 2023-11-24 MED ORDER — DIPHENHYDRAMINE HCL 12.5 MG/5ML PO ELIX: 12.5000 mg | ORAL_SOLUTION | ORAL | Status: DC | PRN

## 2023-11-24 MED ORDER — PROPOFOL 500 MG/50ML IV EMUL
INTRAVENOUS | Status: DC | PRN
  Administered 2023-11-24: 50 ug/kg/min via INTRAVENOUS

## 2023-11-24 MED ORDER — ENOXAPARIN SODIUM 40 MG/0.4ML IJ SOSY
40.0000 mg | PREFILLED_SYRINGE | INTRAMUSCULAR | Status: DC
  Administered 2023-11-25 – 2023-11-26 (×2): 40 mg via SUBCUTANEOUS
  Filled 2023-11-24 (×2): qty 0.4

## 2023-11-24 MED ORDER — ONDANSETRON HCL 4 MG/2ML IJ SOLN: 4.0000 mg | Freq: Four times a day (QID) | INTRAMUSCULAR | Status: DC | PRN

## 2023-11-24 MED ORDER — KETOROLAC TROMETHAMINE 15 MG/ML IJ SOLN
7.5000 mg | Freq: Four times a day (QID) | INTRAMUSCULAR | Status: AC
  Administered 2023-11-24 – 2023-11-25 (×4): 7.5 mg via INTRAVENOUS
  Filled 2023-11-24 (×4): qty 1

## 2023-11-24 MED ORDER — ACETAMINOPHEN 500 MG PO TABS
500.0000 mg | ORAL_TABLET | Freq: Four times a day (QID) | ORAL | Status: AC
  Administered 2023-11-25 (×3): 500 mg via ORAL
  Filled 2023-11-24 (×3): qty 1

## 2023-11-24 MED ORDER — ACETAMINOPHEN 325 MG PO TABS: 325.0000 mg | ORAL_TABLET | Freq: Four times a day (QID) | ORAL | Status: DC | PRN

## 2023-11-24 MED ORDER — ONDANSETRON HCL 4 MG/2ML IJ SOLN
INTRAMUSCULAR | Status: DC | PRN
Start: 2023-11-24 — End: 2023-11-24
  Administered 2023-11-24: 4 mg via INTRAVENOUS

## 2023-11-24 MED ORDER — PROPOFOL 1000 MG/100ML IV EMUL
INTRAVENOUS | Status: AC
  Filled 2023-11-24: qty 100

## 2023-11-24 MED ORDER — CEFAZOLIN SODIUM-DEXTROSE 2-4 GM/100ML-% IV SOLN
2.0000 g | Freq: Four times a day (QID) | INTRAVENOUS | Status: AC
  Administered 2023-11-24 – 2023-11-25 (×2): 2 g via INTRAVENOUS
  Filled 2023-11-24 (×2): qty 100

## 2023-11-24 MED ORDER — PHENYLEPHRINE 80 MCG/ML (10ML) SYRINGE FOR IV PUSH (FOR BLOOD PRESSURE SUPPORT)
PREFILLED_SYRINGE | INTRAVENOUS | Status: AC
  Filled 2023-11-24: qty 10

## 2023-11-24 MED ORDER — OXYCODONE HCL 5 MG/5ML PO SOLN: 5.0000 mg | Freq: Once | ORAL | Status: DC | PRN

## 2023-11-24 MED ORDER — PHENYLEPHRINE 80 MCG/ML (10ML) SYRINGE FOR IV PUSH (FOR BLOOD PRESSURE SUPPORT): PREFILLED_SYRINGE | INTRAVENOUS | Status: DC | PRN

## 2023-11-24 MED ORDER — FENTANYL CITRATE (PF) 100 MCG/2ML IJ SOLN: 25.0000 ug | INTRAMUSCULAR | Status: DC | PRN

## 2023-11-24 MED ORDER — BISACODYL 10 MG RE SUPP: 10.0000 mg | Freq: Every day | RECTAL | Status: DC | PRN

## 2023-11-24 MED ORDER — ONDANSETRON HCL 4 MG PO TABS: 4.0000 mg | ORAL_TABLET | Freq: Four times a day (QID) | ORAL | Status: DC | PRN

## 2023-11-24 MED ORDER — OXYCODONE HCL 5 MG PO TABS: 5.0000 mg | ORAL_TABLET | Freq: Once | ORAL | Status: DC | PRN

## 2023-11-24 MED ORDER — FENTANYL CITRATE (PF) 100 MCG/2ML IJ SOLN
INTRAMUSCULAR | Status: DC | PRN
  Administered 2023-11-24 (×2): 50 ug via INTRAVENOUS

## 2023-11-24 MED ORDER — CEFAZOLIN SODIUM-DEXTROSE 2-4 GM/100ML-% IV SOLN
INTRAVENOUS | Status: AC
  Filled 2023-11-24: qty 100

## 2023-11-24 MED ORDER — ACETAMINOPHEN 10 MG/ML IV SOLN: 1000.0000 mg | Freq: Once | INTRAVENOUS | Status: DC | PRN

## 2023-11-24 MED ORDER — METOCLOPRAMIDE HCL 5 MG/ML IJ SOLN: 5.0000 mg | Freq: Three times a day (TID) | INTRAMUSCULAR | Status: DC | PRN

## 2023-11-24 MED ORDER — BUPIVACAINE-EPINEPHRINE (PF) 0.5% -1:200000 IJ SOLN
INTRAMUSCULAR | Status: AC
  Filled 2023-11-24: qty 30

## 2023-11-24 MED ORDER — FLEET ENEMA RE ENEM: 1.0000 | ENEMA | Freq: Once | RECTAL | Status: DC | PRN

## 2023-11-24 MED ORDER — GLYCOPYRROLATE 0.2 MG/ML IJ SOLN
INTRAMUSCULAR | Status: DC | PRN
Start: 2023-11-24 — End: 2023-11-24
  Administered 2023-11-24: .2 mg via INTRAVENOUS

## 2023-11-24 MED ORDER — SODIUM CHLORIDE 0.9 % IV SOLN: INTRAVENOUS | Status: AC

## 2023-11-24 MED ORDER — SODIUM CHLORIDE 0.9 % IV BOLUS
500.0000 mL | Freq: Once | INTRAVENOUS | Status: AC
  Administered 2023-11-24: 500 mL via INTRAVENOUS

## 2023-11-24 MED ORDER — 0.9 % SODIUM CHLORIDE (POUR BTL) OPTIME
TOPICAL | Status: DC | PRN
  Administered 2023-11-24: 500 mL

## 2023-11-24 MED ORDER — VASOPRESSIN 20 UNIT/ML IV SOLN
INTRAVENOUS | Status: DC | PRN
  Administered 2023-11-24 (×8): 1 [IU] via INTRAVENOUS

## 2023-11-24 MED ORDER — KETOROLAC TROMETHAMINE 15 MG/ML IJ SOLN
15.0000 mg | Freq: Once | INTRAMUSCULAR | Status: AC
  Administered 2023-11-24: 15 mg via INTRAVENOUS

## 2023-11-24 SURGICAL SUPPLY — 46 items
BIT DRILL 4.3MMS DISTAL GRDTED (BIT) IMPLANT
BNDG COHESIVE 4X5 TAN STRL LF (GAUZE/BANDAGES/DRESSINGS) ×1 IMPLANT
BNDG COHESIVE 6X5 TAN ST LF (GAUZE/BANDAGES/DRESSINGS) ×1 IMPLANT
CHLORAPREP W/TINT 26 (MISCELLANEOUS) ×2 IMPLANT
CORTICAL BONE SCR 5.0MM X 48MM (Screw) ×1 IMPLANT
DRAPE C-ARMOR (DRAPES) ×1 IMPLANT
DRAPE SHEET LG 3/4 BI-LAMINATE (DRAPES) ×1 IMPLANT
DRILL 4.3MMS DISTAL GRADUATED (BIT) ×1 IMPLANT
DRSG MEPILEX SACRM 8.7X9.8 (GAUZE/BANDAGES/DRESSINGS) ×1 IMPLANT
DRSG OPSITE POSTOP 3X4 (GAUZE/BANDAGES/DRESSINGS) IMPLANT
DRSG OPSITE POSTOP 4X6 (GAUZE/BANDAGES/DRESSINGS) IMPLANT
DRSG OPSITE POSTOP 4X8 (GAUZE/BANDAGES/DRESSINGS) IMPLANT
ELECT CAUTERY BLADE 6.4 (BLADE) ×1 IMPLANT
ELECT REM PT RETURN 9FT ADLT (ELECTROSURGICAL) ×1 IMPLANT
ELECTRODE REM PT RTRN 9FT ADLT (ELECTROSURGICAL) ×1 IMPLANT
GAUZE SPONGE 4X4 12PLY STRL (GAUZE/BANDAGES/DRESSINGS) ×1 IMPLANT
GLOVE BIO SURGEON STRL SZ8 (GLOVE) ×2 IMPLANT
GLOVE INDICATOR 8.0 STRL GRN (GLOVE) ×1 IMPLANT
GOWN STRL REUS W/ TWL LRG LVL3 (GOWN DISPOSABLE) ×1 IMPLANT
GOWN STRL REUS W/ TWL XL LVL3 (GOWN DISPOSABLE) ×1 IMPLANT
GUIDEPIN VERSANAIL DSP 3.2X444 (ORTHOPEDIC DISPOSABLE SUPPLIES) IMPLANT
GUIDEWIRE BALL NOSE 80CM (WIRE) IMPLANT
HANDLE YANKAUER SUCT OPEN TIP (MISCELLANEOUS) ×1 IMPLANT
MANIFOLD NEPTUNE II (INSTRUMENTS) ×1 IMPLANT
MAT ABSORB FLUID 56X50 GRAY (MISCELLANEOUS) ×1 IMPLANT
NAIL HIP FRACT LT 130D 11X400 (Nail) IMPLANT
NDL FILTER BLUNT 18X1 1/2 (NEEDLE) ×1 IMPLANT
NDL HYPO 22X1.5 SAFETY MO (MISCELLANEOUS) ×1 IMPLANT
NEEDLE FILTER BLUNT 18X1 1/2 (NEEDLE) ×1 IMPLANT
NEEDLE HYPO 22X1.5 SAFETY MO (MISCELLANEOUS) ×1 IMPLANT
NS IRRIG 500ML POUR BTL (IV SOLUTION) ×1 IMPLANT
PACK HIP COMPR (MISCELLANEOUS) ×1 IMPLANT
PENCIL SMOKE EVACUATOR (MISCELLANEOUS) ×1 IMPLANT
SCREW BONE CORTICAL 5.0X42 (Screw) IMPLANT
SCREW CORTICL BON 5.0MM X 48MM (Screw) IMPLANT
SCREW LAG 10.5MMX105MM HFN (Screw) IMPLANT
STAPLER SKIN PROX 35W (STAPLE) ×1 IMPLANT
STRAP SAFETY 5IN WIDE (MISCELLANEOUS) ×1 IMPLANT
SUT VIC AB 0 CT1 36 (SUTURE) ×1 IMPLANT
SUT VIC AB 1 CT1 36 (SUTURE) ×1 IMPLANT
SUT VIC AB 2-0 CT1 (SUTURE) ×2 IMPLANT
SYR 10ML LL (SYRINGE) ×1 IMPLANT
SYR 30ML LL (SYRINGE) ×1 IMPLANT
TAPE MICROFOAM 4IN (TAPE) ×1 IMPLANT
TRAP FLUID SMOKE EVACUATOR (MISCELLANEOUS) ×1 IMPLANT
WATER STERILE IRR 500ML POUR (IV SOLUTION) ×1 IMPLANT

## 2023-11-24 NOTE — Transfer of Care (Signed)
 Immediate Anesthesia Transfer of Care Note  Patient: Douglas Lyons  Procedure(s) Performed: FIXATION, FRACTURE, INTERTROCHANTERIC, WITH INTRAMEDULLARY ROD (Left: Hip)  Patient Location: PACU  Anesthesia Type:General  Level of Consciousness: awake, alert , and patient cooperative  Airway & Oxygen Therapy: Patient Spontanous Breathing  Post-op Assessment: Report given to RN and Post -op Vital signs reviewed and stable  Post vital signs: stable  Last Vitals:  Vitals Value Taken Time  BP    Temp    Pulse 64 11/24/23 1513  Resp 12 11/24/23 1513  SpO2 100 % 11/24/23 1513  Vitals shown include unfiled device data.  Last Pain:  Vitals:   11/24/23 1227  TempSrc:   PainSc: 1          Complications: No notable events documented.

## 2023-11-24 NOTE — Plan of Care (Signed)
  Problem: Coping: Goal: Ability to adjust to condition or change in health will improve Outcome: Progressing   Problem: Fluid Volume: Goal: Ability to maintain a balanced intake and output will improve Outcome: Progressing   Problem: Safety: Goal: Ability to remain free from injury will improve Outcome: Progressing

## 2023-11-24 NOTE — Anesthesia Preprocedure Evaluation (Signed)
 Anesthesia Evaluation  Patient identified by MRN, date of birth, ID band Patient awake    Reviewed: Allergy & Precautions, H&P , NPO status , Patient's Chart, lab work & pertinent test results, reviewed documented beta blocker date and time   Airway Mallampati: III   Neck ROM: full    Dental  (+) Poor Dentition   Pulmonary neg pulmonary ROS   Pulmonary exam normal        Cardiovascular Exercise Tolerance: Good hypertension, On Medications negative cardio ROS Normal cardiovascular exam Rhythm:regular Rate:Normal     Neuro/Psych negative neurological ROS  negative psych ROS   GI/Hepatic Neg liver ROS,GERD  Medicated,,  Endo/Other  negative endocrine ROSdiabetes, Well Controlled, Type 2, Oral Hypoglycemic Agents    Renal/GU Renal disease  negative genitourinary   Musculoskeletal   Abdominal   Peds  Hematology negative hematology ROS (+)   Anesthesia Other Findings Past Medical History: No date: Cataracts, bilateral No date: Degenerative joint disease of knee, left No date: Diabetes mellitus, type 2 (HCC) No date: Diverticulosis No date: Erectile dysfunction No date: GERD (gastroesophageal reflux disease) No date: Heme positive stool No date: Hyperlipidemia No date: Hypertension 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 No date: Overweight (BMI 25.0-29.9) Past Surgical History: 12/01/2020: ANTERIOR CERVICAL DECOMP/DISCECTOMY FUSION; N/A     Comment:  Procedure: ANTERIOR CERVICAL DECOMPRESSION/DISCECTOMY               FUSION 2 LEVELS C3-4, C4-5;  Surgeon: Lucy Chris, MD;                Location: [facility] ORS;  Service: Neurosurgery;  Laterality:               N/A; No date: COLONOSCOPY 07/26/2015: COLONOSCOPY; N/A     Comment:  Procedure: COLONOSCOPY;  Surgeon: Scot Jun, MD;               Location: [facility] ENDOSCOPY;  Service: Endoscopy;                Laterality: N/A; 09/29/2020: COLONOSCOPY; N/A     Comment:   Procedure: COLONOSCOPY;  Surgeon: Regis Bill,               MD;  Location: [facility] ENDOSCOPY;  Service: Endoscopy;                Laterality: N/A; No date: SHOULDER ARTHROSCOPY; Right No date: VASECTOMY BMI    Body Mass Index: 30.69 kg/m     Reproductive/Obstetrics negative OB ROS                             Anesthesia Physical Anesthesia Plan  ASA: 3 and emergent  Anesthesia Plan: Spinal   Post-op Pain Management:    Induction:   PONV Risk Score and Plan: 2  Airway Management Planned:   Additional Equipment:   Intra-op Plan:   Post-operative Plan:   Informed Consent: I have reviewed the patients History and Physical, chart, labs and discussed the procedure including the risks, benefits and alternatives for the proposed anesthesia with the patient or authorized representative who has indicated his/her understanding and acceptance.     Dental Advisory Given  Plan Discussed with: CRNA  Anesthesia Plan Comments:        Anesthesia Quick Evaluation

## 2023-11-24 NOTE — Anesthesia Procedure Notes (Signed)
 Spinal  Patient location during procedure: OR Start time: 11/24/2023 1:30 PM End time: 11/24/2023 1:35 PM Reason for block: surgical anesthesia Staffing Performed: resident/CRNA  Anesthesiologist: Yevette Edwards, MD Resident/CRNA: Maryla Morrow., CRNA Performed by: Maryla Morrow., CRNA Authorized by: Yevette Edwards, MD   Preanesthetic Checklist Completed: patient identified, IV checked, site marked, risks and benefits discussed, surgical consent, monitors and equipment checked, pre-op evaluation and timeout performed Spinal Block Patient position: sitting Prep: Betadine Patient monitoring: heart rate, continuous pulse ox, blood pressure and cardiac monitor Approach: midline Location: L4-5 Injection technique: single-shot Needle Needle type: Whitacre and Introducer  Needle gauge: 24 G Needle length: 9 cm Assessment Events: CSF return Additional Notes Negative paresthesia. Negative blood return. Positive free-flowing CSF. Expiration date of kit checked and confirmed. Patient tolerated procedure well, without complications.

## 2023-11-24 NOTE — Op Note (Signed)
 11/24/2023  3:11 PM  Patient:   Douglas Lyons  Pre-Op Diagnosis:   Displaced intertrochanteric fracture, left hip.  Post-Op Diagnosis:   Same  Procedure:   Reduction and internal fixation of displaced intertrochanteric left hip fracture with Biomet Affixis TFN nail.  Surgeon:   Maryagnes Amos, MD  Assistant:   None  Anesthesia:   Spinal  Findings:   As above  Complications:   None  EBL:   50 cc  Fluids:   800 cc crystalloid  UOP:   None  TT:   None  Drains:   None  Closure:   Staples  Implants:   Biomet Affixis 11 x 400 mm TFN with a 105 mm lag screw and 42 m and 48 mm distal interlocking screws  Brief Clinical Note:   The patient is a 78 year old male who sustained the above-noted injury last evening when he apparently tripped over boots while walking outside. The patient was brought to the emergency room where x-rays demonstrated the above-noted injury. The patient has been cleared medically and presents at this time for reduction and internal fixation of the displaced intertrochanteric left hip fracture.  Procedure:   The patient was brought into the operating room. After adequate spinal anesthesia was obtained, the patient was lain in the supine position on the fracture table. The uninjured leg was placed in a flexed and abducted position while the injured lower extremity was placed in longitudinal traction. The fracture was reduced using longitudinal traction and internal rotation. The adequacy of reduction was verified fluoroscopically in AP and lateral projections and found to be near anatomic. The lateral aspects of the left hip and thigh were prepped with ChloraPrep solution before being draped sterilely. Preoperative antibiotics were administered. A timeout was performed to verify the appropriate surgical site.   The greater trochanter was identified fluoroscopically and an approximately 6-8 cm incision made about 2-3 fingerbreadths above the tip of the greater  trochanter. The incision was carried down through the subcutaneous tissues to expose the gluteal fascia. This was split the length of the incision, providing access to the tip of the trochanter. Under fluoroscopic guidance, a guidewire was drilled through the tip of the trochanter into the proximal metaphysis to the level of the lesser trochanter. After verifying its position fluoroscopically in AP and lateral projections, it was overreamed with the initial reamer to the depth of the lesser trochanter. A guidewire was passed down through the femoral canal to the supracondylar region. The adequacy of guidewire position was verified fluoroscopically in AP and lateral projections before the length of the guidewire within the canal was measured and found to be 425 mm. Therefore, a 400 mm length nail was selected. The guidewire was overreamed sequentially using the flexible reamers, beginning with a 10.5 mm reamer and progressing to a 12.5 mm reamer. This provided good cortical chatter. The 11 x 400 mm Biomet Affixis TFN rod was selected and advanced to the appropriate depth, as verified fluoroscopically.   The guide system for the lag screw was positioned and advanced through an approximately 2 cm stab incision over the lateral aspect of the proximal femur. The guidewire was drilled up through the trochanteric femoral nail and into the femoral neck to rest within 5 mm of subchondral bone. After verifying its position in the femoral neck and head in both AP and lateral projections, the guidewire was measured and found to be optimally replicated by a 105 mm lag screw. The guidewire was overreamed to  the appropriate depth before the lag screw was inserted and advanced to the appropriate depth as verified fluoroscopically in AP and lateral projections. The locking screw was advanced, then backed off a quarter turn to set the lag screw. Again the adequacy of hardware position and fracture reduction was verified  fluoroscopically in AP and lateral projections and found to be excellent.  Attention was directed distally. Using the "perfect circle" technique, the leg and fluoroscopy machine were positioned appropriately. An approximately 1.5 cm stab incision was made over the skin at the appropriate point before the drill bit was advanced through the cortex and across the static hole of the nail. The appropriate length of the screw was determined before the 42 mm distal interlocking screw was positioned, then advanced and tightened securely.  Given that this fracture demonstrated a reverse obliquity pattern, it was felt best to utilize a second distal interlocking screw. Using the "perfect circle" technique, an approximately 1.5 cm stab incision was made over the skin at the appropriate point before the drill bit was advanced through the cortex and across the dynamic hole of the nail. The appropriate length of the screw was determined before the 48 mm distal interlocking screw was positioned, then advanced and tightened securely. Again the adequacy of screw positions were verified fluoroscopically in AP and lateral projections and found to be excellent.  The wounds were irrigated thoroughly with sterile saline solution before the abductor fascia was reapproximated using #0 Vicryl interrupted sutures. The subcutaneous tissues were closed using 2-0 Vicryl interrupted sutures. The skin was closed using staples. A solution of 20 cc of Exparel and 30 cc of 0.5% Sensorcaine with epinephrine was injected in and around all incisions. Sterile occlusive dressings were applied to all wounds before the patient was transferred back to his hospital bed. The patient was then returned to the recovery room in satisfactory condition after tolerating the procedure well.

## 2023-11-24 NOTE — Progress Notes (Signed)
 PROGRESS NOTE  Douglas Lyons  ZOX:[id number] DOB: 27-Mar-1946 DOA: 11/23/2023 PCP: Myrene Buddy, NP  Consultants  Brief Narrative: 78 y.o. 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  He denies hitting his head and had loss of he is not currently on anticoagulation.  Came to ED for the same and had hip x-ray which showed severely displaced and comminuted intertrochanteric fracture of the proximal left femur.  Admitted to [facility] hospitalist with orthopedic consulting.   Assessment & Plan: * Closed left hip fracture, initial encounter [facility]) Accidental fall Patient seen and examined in ER hallway prior to surgery. -Remains NPO. -Reports when he is resting he does not have any pain.  If he attempts to move he does have fairly severe pain. -Appreciate orthopedic recommendations.  Plan for surgery this morning. -Reports he had vertebral neck fusion 2 years ago.  Reports being fairly active.  No history of chest pain or shortness of breath with exertion, cardiac disease, stent placement.  Denies any personal cardiac history.   History of DVT (deep vein thrombosis) SCD for DVT prophylaxis and Lovenox starting 24 hours postop or per orthopedic preference   Hyperlipidemia Continue rosuvastatin   Hypertension Enalapril continued on admission.   Diabetes mellitus, type 2 (HCC) Sliding scale insulin coverage    Normocytic anemia: -Present since early 2023 at least. -Need to clarify regarding whether he has had a colonoscopy and if not follow-up outpatient     DVT prophylaxis:  enoxaparin (LOVENOX) injection 40 mg Start: 11/25/23 0800 SCDs Start: 11/24/23 1536  Code Status:   Code Status: Full Code Family Communication: Wife at bedside Level of care: Med-Surg Status is: Inpatient  for surgical repair   Consults called: Orthopedics  Subjective: Patient seen in ER hallway and not complaining of any pain currently -unless he moves.  He is prepared for surgery.  Objective: Vitals:   11/24/23 1515 11/24/23 1517 11/24/23 1530 11/24/23 1545  BP: (!) 79/55 (!) 75/52 (!) 106/52 105/60  Pulse: 62 64 (!) 59 (!) 54  Resp: 13 17 15 16   Temp: (!) 97 F (36.1 C)     TempSrc:      SpO2: 99% 97% 97% 96%  Weight:      Height:        Intake/Output Summary (Last 24 hours) at 11/24/2023 1556 Last data filed at 11/24/2023 1503 Gross per 24 hour  Intake 900 ml  Output 250 ml  Net 650 ml   Filed Weights   11/23/23 1616 11/24/23 1227  Weight: 99.8 kg 99.8 kg   Body mass index is 30.69 kg/m.  Gen: 78 y.o. male in no apparent distress.  Nontoxic Pulm: Non-labored breathing.  Clear to auscultation bilaterally.  CV: Regular rate and rhythm.  GI: Abdomen soft, non-tender, non-distended, with normoactive bowel sounds. No organomegaly or masses felt. Ext: Left leg shortened with obvious deformity.  Did not attempt manipulations secondary to pain.  Good cap refill all toes and sensation fully intact. Skin: No rashes, lesions  Neuro: Alert and oriented. No focal neurological deficits. Psych: Calm  Judgement and insight appear normal. Mood & affect appropriate.     I have personally reviewed the following labs and images: CBC: Recent Labs  Lab 11/23/23 1841 11/24/23 0815  WBC 11.8* 7.0  NEUTROABS 10.0*  --  HGB 11.8* 10.9*  HCT 37.1* 33.7*  MCV 87.1 87.3  PLT 209 201   BMP &GFR Recent Labs  Lab 11/23/23 1841 11/24/23 0815  NA 136 136  K 5.4* 4.4  CL 106 104  CO2 22 24  GLUCOSE 294* 195*  BUN 20 16  CREATININE 1.01 0.84  CALCIUM 8.9 8.4*   Estimated Creatinine Clearance: 85.8 mL/min (by C-G formula based on SCr of 0.84 mg/dL). Liver & Pancreas: Recent Labs  Lab 11/23/23 1841  AST 16  ALT 14  ALKPHOS 51  BILITOT 0.6  PROT 6.1*  ALBUMIN 3.6   No  results for input(s): "LIPASE", "AMYLASE" in the last 168 hours. No results for input(s): "AMMONIA" in the last 168 hours. Diabetic: Recent Labs    11/23/23 1841  HGBA1C 8.2*   Recent Labs  Lab 11/24/23 0347 11/24/23 0755 11/24/23 1200 11/24/23 1229 11/24/23 1517  GLUCAP 212* 194* 158* 173* 167*   Cardiac Enzymes: Recent Labs  Lab 11/24/23 0815  CKTOTAL 49   No results for input(s): "PROBNP" in the last 8760 hours. Coagulation Profile: No results for input(s): "INR", "PROTIME" in the last 168 hours. Thyroid Function Tests: No results for input(s): "TSH", "T4TOTAL", "FREET4", "T3FREE", "THYROIDAB" in the last 72 hours. Lipid Profile: No results for input(s): "CHOL", "HDL", "LDLCALC", "TRIG", "CHOLHDL", "LDLDIRECT" in the last 72 hours. Anemia Panel: No results for input(s): "VITAMINB12", "FOLATE", "FERRITIN", "TIBC", "IRON", "RETICCTPCT" in the last 72 hours. Urine analysis:    Component Value Date/Time   COLORURINE YELLOW (A) 10/06/2021 1454   APPEARANCEUR HAZY (A) 10/06/2021 1454   LABSPEC 1.024 10/06/2021 1454   PHURINE 5.0 10/06/2021 1454   GLUCOSEU 150 (A) 10/06/2021 1454   HGBUR SMALL (A) 10/06/2021 1454   BILIRUBINUR NEGATIVE 10/06/2021 1454   KETONESUR 5 (A) 10/06/2021 1454   PROTEINUR 100 (A) 10/06/2021 1454   NITRITE NEGATIVE 10/06/2021 1454   LEUKOCYTESUR TRACE (A) 10/06/2021 1454   Sepsis Labs: Invalid input(s): "PROCALCITONIN", "LACTICIDVEN"  Microbiology: No results found for this or any previous visit (from the past 240 hours).  Radiology Studies: DG HIP UNILAT WITH PELVIS 2-3 VIEWS RIGHT Result Date: 11/24/2023 CLINICAL DATA:  ORIF of the left femoral fracture. EXAM: DG HIP (WITH OR WITHOUT PELVIS) 2-3V RIGHT COMPARISON:  Left knee radiograph dated 11/23/2023. FINDINGS: Seven intraoperative fluoroscopic spot images provided. The total fluoroscopic time is 57 seconds with a cumulative air Karma of 19.18 mGy. Status post ORIF of left femoral  fracture. IMPRESSION: Intraoperative fluoroscopic spot images. Electronically Signed   By: Elgie Collard M.D.   On: 11/24/2023 15:44   DG C-Arm 1-60 Min-No Report Result Date: 11/24/2023 Fluoroscopy was utilized by the requesting physician.  No radiographic interpretation.   DG Hip Unilat W or Wo Pelvis 2-3 Views Left Result Date: 11/23/2023 CLINICAL DATA:  Left hip pain after fall. EXAM: DG HIP (WITH OR WITHOUT PELVIS) 2-3V LEFT COMPARISON:  None Available. FINDINGS: Severely displaced and comminuted fracture is seen involving the intertrochanteric region of the proximal left femur. IMPRESSION: Severely displaced and comminuted intertrochanteric fracture of proximal left femur. Electronically Signed   By: Lupita Raider M.D.   On: 11/23/2023 17:07   DG Chest 1 View Result Date: 11/23/2023 CLINICAL DATA:  Preop. EXAM: CHEST  1 VIEW COMPARISON:  None Available. FINDINGS: Mild cardiomegaly is noted. Both lungs are clear. The visualized skeletal structures are unremarkable. IMPRESSION: No active disease. Electronically Signed   By: Lupita Raider M.D.   On: 11/23/2023 17:06  Scheduled Meds:  acetaminophen  500 mg Oral Q6H   docusate sodium  100 mg Oral BID   [MAR Hold] enalapril  5 mg Oral Daily   [START ON 11/25/2023] enoxaparin (LOVENOX) injection  40 mg Subcutaneous Q24H   [MAR Hold] insulin aspart  0-15 Units Subcutaneous Q4H   ketorolac  7.5 mg Intravenous Q6H   [MAR Hold] rosuvastatin  20 mg Oral Daily   Continuous Infusions:  sodium chloride      ceFAZolin (ANCEF) IV       LOS: 1 day   35 minutes with more than 50% spent in reviewing records, counseling patient/family and coordinating care.  Tobey Grim, MD [facility] [email or web address] 11/24/2023, 3:56 PM

## 2023-11-24 NOTE — Consult Note (Signed)
 ORTHOPAEDIC CONSULTATION  REQUESTING PHYSICIAN: No att. providers found  Chief Complaint:   Left hip pain.  History of Present Illness: Douglas Lyons is a 78 y.o. male with a history of hypertension, hyperlipidemia, diabetes, and gastroesophageal reflux disease who normally ambulates with a cane when outside of the home but can ambulate without any assistive devices inside of his home.  The patient also does steady work in maintenance, although he avoids any heavier or more strenuous activities.  Apparently, while working outside, he went to pick up a tool for his partner and tripped over some roots growing above ground, landing on his left hip.  He was brought to the emergency room where x-rays demonstrated a displaced four-part intertrochanteric fracture of the left hip.  The patient denies any associated injuries.  He did not strike his head or lose consciousness.  The patient also denies any lightheadedness, dizziness, chest pain, shortness of breath, or other symptoms which may precipitated his fall.  Past Medical History:  Diagnosis Date   Cataracts, bilateral    Degenerative joint disease of knee, left    Diabetes mellitus, type 2 (HCC)    Diverticulosis    Erectile dysfunction    GERD (gastroesophageal reflux disease)    Heme positive stool    Hyperlipidemia    Hypertension    Lumbar disc herniation    Overweight (BMI 25.0-29.9)    Past Surgical History:  Procedure Laterality Date   ANTERIOR CERVICAL DECOMP/DISCECTOMY FUSION N/A 12/01/2020   Procedure: ANTERIOR CERVICAL DECOMPRESSION/DISCECTOMY FUSION 2 LEVELS C3-4, C4-5;  Surgeon: Lucy Chris, MD;  Location: [facility] ORS;  Service: Neurosurgery;  Laterality: N/A;   COLONOSCOPY     COLONOSCOPY N/A 07/26/2015   Procedure: COLONOSCOPY;  Surgeon: Scot Jun, MD;  Location: [facility] ENDOSCOPY;  Service: Endoscopy;  Laterality: N/A;   COLONOSCOPY N/A 09/29/2020    Procedure: COLONOSCOPY;  Surgeon: Regis Bill, MD;  Location: [facility] ENDOSCOPY;  Service: Endoscopy;  Laterality: N/A;   SHOULDER ARTHROSCOPY Right    VASECTOMY     Social History   Socioeconomic History   Marital status: Married    Spouse name: Not on file   Number of children: Not on file   Years of education: Not on file   Highest education level: Not on file  Occupational History   Not on file  Tobacco Use   Smoking status: Never   Smokeless tobacco: Never  Vaping Use   Vaping status: Never Used  Substance and Sexual Activity   Alcohol use: Yes    Comment: OCCASIONAL    Drug use: Never   Sexual activity: Not Currently  Other Topics Concern   Not on file  Social History Narrative   Not on file   Social Drivers of Health   Financial Resource Strain: Low Risk  (06/25/2023)   Received from [facility]   Overall Financial Resource Strain (CARDIA)    Difficulty of Paying Living Expenses: Not hard at all  Food Insecurity: No Food Insecurity (11/24/2023)   Hunger Vital Sign    Worried About Running Out of Food in the Last Year: Never true    Ran Out of Food in the Last Year: Never true  Transportation Needs: No Transportation Needs (11/24/2023)   PRAPARE - Administrator, Civil Service (Medical): No    Lack of Transportation (Non-Medical): No  Physical Activity: Unknown (06/05/2022)   Received from [facility]   Exercise Vital Sign    Days  of Exercise per Week: 0 days    Minutes of Exercise per Session: Not on file  Stress: Not on file  Social Connections: Socially Integrated (11/24/2023)   Social Connection and Isolation Panel [NHANES]    Frequency of Communication with Friends and Family: More than three times a week    Frequency of Social Gatherings with Friends and Family: Once a week    Attends Religious Services: More than 4 times per year    Active Member of [employer] or Organizations: Yes    Attends Museum/gallery exhibitions officer: More than 4 times per year    Marital Status: Married   Family History  Problem Relation Age of Onset   Cancer Father    Allergies  Allergen Reactions   Rosuvastatin Other (See Comments)    Other reaction(s): Muscle Pain Is taking every other day and he feels much better as of 12/30/2016 Is taking every other day and he feels much better as of 12/30/2016  Is taking every other day and he feels much better as of 12/30/2016 Other reaction(s): Muscle Pain Is taking every other day and he feels much better as of 12/30/2016 Is taking every other day and he feels much better as of 12/30/2016   Prior to Admission medications   Medication Sig Start Date End Date Taking? Authorizing Provider  acetaminophen (TYLENOL) 650 MG CR tablet Take 650 mg by mouth in the morning and at bedtime.   Yes [provider]  aspirin EC 81 MG tablet Take 81 mg by mouth daily. Swallow whole.   Yes [provider]  enalapril (VASOTEC) 5 MG tablet Take 1 tablet by mouth daily. 01/29/21  Yes [provider]  glipiZIDE-metformin (METAGLIP) 2.5-500 MG tablet Take 1 tablet by mouth 2 (two) times daily before a meal.   Yes [provider]  meloxicam (MOBIC) 15 MG tablet Take 15 mg by mouth daily. 11/12/23 02/10/24 Yes [provider]  rosuvastatin (CRESTOR) 5 MG tablet Take 1 tablet by mouth every other day. 10/04/16  Yes [provider]  cholecalciferol (VITAMIN D3) 25 MCG (1000 UNIT) tablet Take 1,000 Units by mouth daily. Patient not taking: Reported on 11/24/2023    [provider]   DG Hip Unilat W or Wo Pelvis 2-3 Views Left Result Date: 11/23/2023 CLINICAL DATA:  Left hip pain after fall. EXAM: DG HIP (WITH OR WITHOUT PELVIS) 2-3V LEFT COMPARISON:  None Available. FINDINGS: Severely displaced and comminuted fracture is seen involving the intertrochanteric region of the proximal left femur. IMPRESSION: Severely displaced and comminuted  intertrochanteric fracture of proximal left femur. Electronically Signed   By: Lupita Raider M.D.   On: 11/23/2023 17:07   DG Chest 1 View Result Date: 11/23/2023 CLINICAL DATA:  Preop. EXAM: CHEST  1 VIEW COMPARISON:  None Available. FINDINGS: Mild cardiomegaly is noted. Both lungs are clear. The visualized skeletal structures are unremarkable. IMPRESSION: No active disease. Electronically Signed   By: Lupita Raider M.D.   On: 11/23/2023 17:06   Positive ROS: All other systems have been reviewed and were otherwise negative with the exception of those mentioned in the HPI and as above.  Physical Exam: General:  Alert, no acute distress Psychiatric:  Patient is competent for consent with normal mood and affect   Cardiovascular:  No pedal edema Respiratory:  No wheezing, non-labored breathing GI:  Abdomen is soft and non-tender Skin:  No lesions in the area of chief complaint Neurologic:  Sensation intact distally Lymphatic:  No axillary or cervical lymphadenopathy  Orthopedic Exam:  Orthopedic examination is limited to the left hip and lower extremity.  The left lower extremity is somewhat shortened and externally rotated as compared to the right.  Skin inspection around the left hip is unremarkable.  There is perhaps mild swelling, but no erythema, ecchymosis, abrasions, or other skin abnormalities are identified.  He has mild-moderate tenderness to palpation over the lateral aspect of the left hip.  He has more severe pain with any attempted active or passive motion of the hip.  He is grossly neurovascularly intact to the left lower extremity and foot.  X-rays:  X-rays of the pelvis and left hip are available for review and have been reviewed by myself.  The findings are as described above.  Assessment: Closed displaced four-part intertrochanteric fracture, left hip.  Plan: The treatment options, including both surgical and nonsurgical choices, have been discussed in detail with the  patient and his wife who is at the bedside.  He and his wife agreed to proceed with surgical intervention to include an intramedullary nailing of the displaced intertrochanteric fracture of his left hip.  The risks (including bleeding, infection, nerve and/or blood vessel injury, persistent or recurrent pain, loosening or failure of the components, leg length inequality, malunion and/or nonunion, need for further surgery, blood clots, strokes, heart attacks or arrhythmias, pneumonia, etc.) and benefits of the surgical procedure were discussed.  The patient states his understanding and agrees to proceed.  He agrees to a blood transfusion if necessary.  A formal written consent will be obtained by the nursing staff.  Thank you for asking me to participate in the care of this most pleasant and unfortunate man.  I will be happy to follow him with you.   Maryagnes Amos, MD  Beeper #:  [phone]  11/24/2023 1:21 PM

## 2023-11-25 ENCOUNTER — Encounter: Payer: Self-pay | Admitting: Surgery

## 2023-11-25 DIAGNOSIS — S72002A Fracture of unspecified part of neck of left femur, initial encounter for closed fracture: Secondary | ICD-10-CM | POA: Diagnosis not present

## 2023-11-25 LAB — BASIC METABOLIC PANEL WITH GFR
Anion gap: 9 (ref 5–15)
BUN: 19 mg/dL (ref 8–23)
CO2: 20 mmol/L — ABNORMAL LOW (ref 22–32)
Calcium: 8.2 mg/dL — ABNORMAL LOW (ref 8.9–10.3)
Chloride: 104 mmol/L (ref 98–111)
Creatinine, Ser: 1.01 mg/dL (ref 0.61–1.24)
GFR, Estimated: >60 mL/min (ref >60)
Glucose, Bld: 190 mg/dL — ABNORMAL HIGH (ref 70–99)
Potassium: 4.1 mmol/L (ref 3.5–5.1)
Sodium: 133 mmol/L — ABNORMAL LOW (ref 135–145)

## 2023-11-25 LAB — GLUCOSE, CAPILLARY
Glucose-Capillary: 165 mg/dL — ABNORMAL HIGH (ref 70–99)
Glucose-Capillary: 199 mg/dL — ABNORMAL HIGH (ref 70–99)
Glucose-Capillary: 209 mg/dL — ABNORMAL HIGH (ref 70–99)
Glucose-Capillary: 215 mg/dL — ABNORMAL HIGH (ref 70–99)
Glucose-Capillary: 228 mg/dL — ABNORMAL HIGH (ref 70–99)
Glucose-Capillary: 241 mg/dL — ABNORMAL HIGH (ref 70–99)

## 2023-11-25 LAB — CBC
HCT: 31 % — ABNORMAL LOW (ref 39.0–52.0)
Hemoglobin: 10.1 g/dL — ABNORMAL LOW (ref 13.0–17.0)
MCH: 27.8 pg (ref 26.0–34.0)
MCHC: 32.6 g/dL (ref 30.0–36.0)
MCV: 85.4 fL (ref 80.0–100.0)
Platelets: 204 10*3/uL (ref 150–400)
RBC: 3.63 MIL/uL — ABNORMAL LOW (ref 4.22–5.81)
RDW: 14.2 % (ref 11.5–15.5)
WBC: 8.5 10*3/uL (ref 4.0–10.5)
nRBC: 0 % (ref 0.0–0.2)

## 2023-11-25 MED ORDER — HYDROCODONE-ACETAMINOPHEN 5-325 MG PO TABS: 1.0000 | ORAL_TABLET | Freq: Four times a day (QID) | ORAL | 0 refills | Status: DC | PRN

## 2023-11-25 MED ORDER — POLYETHYLENE GLYCOL 3350 17 G PO PACK
17.0000 g | PACK | Freq: Every day | ORAL | Status: DC
  Administered 2023-11-25 – 2023-11-26 (×2): 17 g via ORAL
  Filled 2023-11-25 (×2): qty 1

## 2023-11-25 MED ORDER — ENALAPRIL MALEATE 2.5 MG PO TABS
5.0000 mg | ORAL_TABLET | Freq: Every day | ORAL | Status: DC
  Administered 2023-11-25 – 2023-11-26 (×2): 5 mg via ORAL
  Filled 2023-11-25 (×2): qty 2

## 2023-11-25 MED ORDER — SENNOSIDES-DOCUSATE SODIUM 8.6-50 MG PO TABS
1.0000 | ORAL_TABLET | Freq: Two times a day (BID) | ORAL | Status: DC
  Administered 2023-11-25 – 2023-11-26 (×3): 1 via ORAL
  Filled 2023-11-25 (×3): qty 1

## 2023-11-25 MED ORDER — ENOXAPARIN SODIUM 40 MG/0.4ML IJ SOSY: 40.0000 mg | PREFILLED_SYRINGE | INTRAMUSCULAR | 0 refills | Status: DC

## 2023-11-25 NOTE — Evaluation (Signed)
 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  Pt currently with functional limitations due to the deficits listed below (see PT Problem List). Pt received upright in recliner agreeable to PT. PTA pt reports being independent still working 40 hours/week. Occasional use of SPC outside with uneven surfaces.   To date pt only reliant on CGA for STS and supervision with gait using RW. Initial min VC's for hand placement and LLE foot clearance but this improves as gait distances increase. Overall completion of 60' of gait today with seated toileting break b/t for urination which pt completes independently. Pt returns to recliner with all needs in reach and reviewing part of his HEP hand out with education on reps/sets/frequency. Anticipate d/c recs will be dictated by stair training later today. If all goes well, pt will benefit from current listed d/c recs to progress pt's deficits in gait and LE strength.      If plan is discharge home, recommend the following: A little help with walking and/or transfers;A little help with bathing/dressing/bathroom;Assistance with cooking/housework;Assist for transportation;Help with stairs or ramp for entrance   Can travel by private vehicle        Equipment Recommendations BSC/3in1  Recommendations for Other Services       Functional Status Assessment Patient has had a recent decline in their functional status and demonstrates the ability to make significant improvements in function in a reasonable and predictable amount of time.     Precautions /  Restrictions Precautions Precautions: Fall Recall of Precautions/Restrictions: Intact Restrictions Weight Bearing Restrictions Per Provider Order: Yes LLE Weight Bearing Per Provider Order: Weight bearing as tolerated      Mobility  Bed Mobility               General bed mobility comments: NT. In recliner pre and post session. Patient Response: Cooperative  Transfers Overall transfer level: Needs assistance Equipment used: Rolling walker (2 wheels) Transfers: Sit to/from Stand Sit to Stand: Contact guard assist           General transfer comment: VC's for hand placement    Ambulation/Gait Ambulation/Gait assistance: Supervision Gait Distance (Feet): 64 Feet (40+24. Seated rest on toilet between bouts.) Assistive device: Rolling walker (2 wheels) Gait Pattern/deviations: Step-to pattern, Antalgic, Trunk flexed       General Gait Details: Min VC's for keeping body within RW frame. Expected, antalgic gait but excellent UE strength on RW to support.  Stairs            Wheelchair Mobility     Tilt Bed Tilt Bed Patient Response: Cooperative  Modified Rankin (Stroke Patients Only)       Balance Overall balance assessment: Needs assistance Sitting-balance support: Feet supported Sitting balance-Leahy Scale: Good     Standing balance support: Bilateral upper extremity supported, During functional activity, Reliant on assistive device for balance Standing balance-Leahy Scale: Fair                               Pertinent Vitals/Pain Pain Assessment Pain Assessment: Faces Faces  Pain Scale: Hurts a little bit Pain Location: L hip Pain Descriptors / Indicators: Discomfort Pain Intervention(s): Limited activity within patient's tolerance, Monitored during session, Repositioned    Home Living Family/patient expects to be discharged to:: Private residence Living Arrangements: Spouse/significant other Available Help at Discharge:  Family;Available 24 hours/day Type of Home: House Home Access: Stairs to enter Entrance Stairs-Rails: Can reach both Entrance Stairs-Number of Steps: 3   Home Layout: One level Home Equipment: Agricultural consultant (2 wheels);Cane - single point Additional Comments: equipment is from family members per pt report    Prior Function Prior Level of Function : Independent/Modified Independent             Mobility Comments: works FT, amb with PRN use of SPC on uneven surfaces per pt report ADLs Comments: MOD I-I in ADL/IADL     Extremity/Trunk Assessment   Upper Extremity Assessment Upper Extremity Assessment: Overall WFL for tasks assessed    Lower Extremity Assessment Lower Extremity Assessment: LLE deficits/detail LLE Deficits / Details: expected ROM deficits and muscle weakness in LLE from injury and surgery    Cervical / Trunk Assessment Cervical / Trunk Assessment: Normal  Communication   Communication Communication: No apparent difficulties    Cognition Arousal: Alert Behavior During Therapy: WFL for tasks assessed/performed   PT - Cognitive impairments: No apparent impairments                         Following commands: Intact       Cueing Cueing Techniques: Verbal cues, Gestural cues     General Comments General comments (skin integrity, edema, etc.): vss throughout, incision x2 intact pre/post session    Exercises General Exercises - Lower Extremity Ankle Circles/Pumps: AROM, Strengthening, Both, 10 reps, Supine Quad Sets: AROM, Strengthening, Left, 10 reps, Supine Heel Slides: AAROM, Strengthening, Left, 10 reps, Supine Hip ABduction/ADduction: AROM, Strengthening, Left, 10 reps Other Exercises Other Exercises: Role of PT in acute setting, WB status, HEP hand out   Assessment/Plan    PT Assessment Patient needs continued PT services  PT Problem List Decreased strength;Decreased range of motion;Decreased knowledge of use of DME;Decreased  balance;Decreased mobility;Pain       PT Treatment Interventions DME instruction;Neuromuscular re-education;Gait training;Stair training;Patient/family education;Functional mobility training;Therapeutic activities;Therapeutic exercise;Balance training    PT Goals (Current goals can be found in the Care Plan section)  Acute Rehab PT Goals Patient Stated Goal: Return home PT Goal Formulation: With patient Time For Goal Achievement: 12/09/23 Potential to Achieve Goals: Good    Frequency BID     Co-evaluation               AM-PAC PT "6 Clicks" Mobility  Outcome Measure Help needed turning from your back to your side while in a flat bed without using bedrails?: A Little Help needed moving from lying on your back to sitting on the side of a flat bed without using bedrails?: A Little Help needed moving to and from a bed to a chair (including a wheelchair)?: A Little Help needed standing up from a chair using your arms (e.g., wheelchair or bedside chair)?: A Little Help needed to walk in hospital room?: A Little Help needed climbing 3-5 steps with a railing? : A Lot 6 Click Score: 17    End of Session Equipment Utilized During Treatment: Gait belt Activity Tolerance: Patient tolerated treatment well Patient left: in chair;with call bell/phone within reach Nurse Communication: Mobility status PT Visit Diagnosis: Other abnormalities of  gait and mobility (R26.89);Muscle weakness (generalized) (M62.81);Pain Pain - Right/Left: Left Pain - part of body: Hip    Time: 1610-9604 PT Time Calculation (min) (ACUTE ONLY): 25 min   Charges:   PT Evaluation $PT Eval Low Complexity: 1 Low PT Treatments $Therapeutic Exercise: 8-22 mins PT General Charges $$ ACUTE PT VISIT: 1 Visit       Delphia Grates. Fairly IV, PT, DPT Physical Therapist- [place]  [facility]  11/25/2023, 11:07 AM

## 2023-11-25 NOTE — Progress Notes (Signed)
 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 Pt having more pain this afternoon likely due to previous mobility from the morning. This is definitely impacting ease of STS transfers today with posterior tipping of RW with STS efforts. Is able to complete ~36' of gait but not progressed due to need for assessing tolerance for stairs. Pt provided recliner for transport with family present to assess tolerance for stairs. PT demo prior to completion. Pt overall very cumbersome with stairs and clearly laborious for pt needing minA from PT at torso due to limited stance phase on LLE needing increased time to complete with visible increased WOB post completion. Pt returned to room. PT and spouse in agreement there are some current mobility concerns with mobilizing in home setting at this point and time. Discussed with medical team on updating d/c recs to involve PT < 3 hours/day to address functional deficits to maximize mobility prior return to home setting. Will continue current PT frequency to maximize capacity. May warrant reduction to 7x/week depending on pain tolerance with physical activity.    If plan is discharge home, recommend the following: A little help with walking and/or transfers;A little help with bathing/dressing/bathroom;Assistance with cooking/housework;Assist for transportation;Help with stairs or ramp for entrance   Can travel by private vehicle     Yes  Equipment Recommendations  None recommended by PT    Recommendations for Other Services        Precautions / Restrictions Precautions Precautions: Fall Recall of Precautions/Restrictions: Intact Restrictions Weight Bearing Restrictions Per Provider Order: Yes LLE Weight Bearing Per Provider Order: Weight bearing as tolerated     Mobility  Bed Mobility               General bed mobility comments: NT. In recliner pre and post session. Patient Response: Cooperative  Transfers Overall transfer level: Needs assistance Equipment used: Rolling walker (2 wheels) Transfers: Sit to/from Stand Sit to Stand: Contact guard assist           General transfer comment: VC's for hand placement. Posterior tipping of RW due to pulling on RW compared to pushing.    Ambulation/Gait Ambulation/Gait assistance: Supervision Gait Distance (Feet): 36 Feet Assistive device: Rolling walker (2 wheels) Gait Pattern/deviations: Step-to pattern, Antalgic, Trunk flexed       General Gait Details: Improved RW use keeping bosy within frame for stability. Decreased distance on second session to conserve strength for stair assessment.   Stairs Stairs: Yes Stairs assistance: Min assist Stair Management: One rail Right, One rail Left, Forwards Number of Stairs: 4 General stair comments: Correct LE sequencing. Needing minA on torso with ascending due to limited tolerance for LLE stance phase. Laborious and cumbersome for pt to complete.   Wheelchair Mobility     Tilt Bed Tilt Bed Patient Response: Cooperative  Modified Rankin (Stroke Patients Only)       Balance Overall balance assessment: Needs assistance Sitting-balance support: Feet supported Sitting balance-Leahy Scale: Good     Standing balance support: Bilateral upper extremity supported, During functional activity, Reliant on assistive device  for balance Standing balance-Leahy Scale: Fair Standing balance comment: reliant on AD                            Communication Communication Communication: No  apparent difficulties  Cognition Arousal: Alert Behavior During Therapy: WFL for tasks assessed/performed   PT - Cognitive impairments: No apparent impairments                         Following commands: Intact      Cueing Cueing Techniques: Verbal cues  Exercises General Exercises - Lower Extremity Ankle Circles/Pumps: AROM, Strengthening, Both, 10 reps, Supine Quad Sets: AROM, Strengthening, Left, 10 reps, Supine Heel Slides: AAROM, Strengthening, Left, 10 reps, Supine Hip ABduction/ADduction: AROM, Strengthening, Left, 10 reps Other Exercises Other Exercises: reviewed HEP hand out. LLE SLR with gait belt assist: x10    General Comments General comments (skin integrity, edema, etc.): HR in mid 90's after session.      Pertinent Vitals/Pain Pain Assessment Pain Assessment: 0-10 Pain Score: 6  Faces Pain Scale: Hurts a little bit Pain Location: L hip Pain Descriptors / Indicators: Discomfort, Sore Pain Intervention(s): Limited activity within patient's tolerance, Monitored during session, Patient requesting pain meds-RN notified    Home Living Family/patient expects to be discharged to:: Private residence Living Arrangements: Spouse/significant other Available Help at Discharge: Family;Available 24 hours/day Type of Home: House Home Access: Stairs to enter Entrance Stairs-Rails: Can reach both Entrance Stairs-Number of Steps: 3   Home Layout: One level Home Equipment: Agricultural consultant (2 wheels);Cane - single point Additional Comments: equipment is from family members per pt report    Prior Function            PT Goals (current goals can now be found in the care plan section) Acute Rehab PT Goals Patient Stated Goal: Return home PT Goal Formulation: With patient Time For Goal Achievement: 12/09/23 Potential to Achieve Goals: Fair Progress towards PT goals: Progressing toward goals    Frequency    BID      PT Plan      Co-evaluation               AM-PAC PT "6 Clicks" Mobility   Outcome Measure  Help needed turning from your back to your side while in a flat bed without using bedrails?: A Little Help needed moving from lying on your back to sitting on the side of a flat bed without using bedrails?: A Little Help needed moving to and from a bed to a chair (including a wheelchair)?: A Little Help needed standing up from a chair using your arms (e.g., wheelchair or bedside chair)?: A Lot Help needed to walk in hospital room?: A Little Help needed climbing 3-5 steps with a railing? : A Lot 6 Click Score: 16    End of Session Equipment Utilized During Treatment: Gait belt Activity Tolerance: Patient tolerated treatment well;Patient limited by pain Patient left: in chair;with call bell/phone within reach;with family/visitor present;with nursing/sitter in room Nurse Communication: Mobility status PT Visit Diagnosis: Other abnormalities of gait and mobility (R26.89);Muscle weakness (generalized) (M62.81);Pain Pain - Right/Left: Left Pain - part of body: Hip     Time: 1316-1350 PT Time Calculation (min) (ACUTE ONLY): 34 min  Charges:    $Gait Training: 23-37 mins $Therapeutic Exercise: 8-22 mins PT General Charges $$ ACUTE PT VISIT: 1 Visit  Delphia Grates. Fairly IV, PT, DPT Physical Therapist- [place]  [facility]  11/25/2023, 2:10 PM

## 2023-11-25 NOTE — Progress Notes (Signed)
 PROGRESS NOTE  Douglas Lyons  ZOX:[id number] DOB: 08/28/1945 DOA: 11/23/2023 PCP: Myrene Buddy, NP   Brief Narrative: Patient is a 78 y.o. 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 Hip x-ray showing severely displaced and comminuted intertrochanteric fracture of the proximal left femur.  Status post ORIF.  PT/OT pending  Assessment & Plan:  Principal Problem:   Closed left hip fracture, initial encounter [facility]) Active Problems:   Accidental fall   Diabetes mellitus, type 2 (HCC)   Hypertension   Hyperlipidemia   History of DVT (deep vein thrombosis)   Closed left hip fracture (HCC)   Closed left hip fracture Hip x-ray showing severely displaced and comminuted intertrochanteric fracture of the proximal left femur.  Status post ORIF.  PT/OT pending.  Continue pain management, supportive care, bowel regimen    Hyperlipidemia Continue rosuvastatin   Hypertension Continue enalapril   Diabetes mellitus Sliding scale insulin coverage        DVT prophylaxis:enoxaparin (LOVENOX) injection 40 mg Start: 11/25/23 0800 SCDs Start: 11/24/23 1536     Code Status: Full Code  Family Communication: None at bedside  Patient status:Inpatient  Patient is from :Home  Anticipated discharge to:SNF vs Home  Estimated DC date:1-2 days.  Pending PT evaluation   Consultants: Orthopedics  Procedures: ORIF  Antimicrobials:  Anti-infectives (From admission, onward)    Start     Dose/Rate Route Frequency Ordered Stop   11/24/23 2000  ceFAZolin (ANCEF) IVPB 2g/100 mL premix        2 g 200 mL/hr over 30 Minutes Intravenous Every 6 hours 11/24/23 1535 11/25/23 0334   11/23/23 2055  ceFAZolin (ANCEF) IVPB 2g/100 mL premix        2 g 200 mL/hr over 30 Minutes Intravenous 30 min  pre-op 11/23/23 2056 11/24/23 1402   11/23/23 2000  ceFAZolin (ANCEF) powder 2 g  Status:  Discontinued       Note to Pharmacy: On call to the OR   2 g Other To Surgery 11/23/23 1959 11/23/23 2056       Subjective: Patient seen and examined at bedside today.  He was still on postop floor.  He was being evaluated by physical therapy.  He was ambulating with the help of walker inside the room.  Denies significant pain or any other complaints.  Looks comfortable  Objective: Vitals:   11/24/23 1944 11/25/23 0015 11/25/23 0433 11/25/23 0748  BP: 128/63 123/76 (!) 95/55 125/61  Pulse: 75 68 69 64  Resp: 18 18 16 15   Temp: 99.1 F (37.3 C) 98.8 F (37.1 C) 99 F (37.2 C) 98.2 F (36.8 C)  TempSrc: Temporal  Temporal Oral  SpO2: 100% 99% 97% 97%  Weight:      Height:        Intake/Output Summary (Last 24 hours) at 11/25/2023 8119 Last data filed at 11/25/2023 0400 Gross per 24 hour  Intake 2451.55 ml  Output 225 ml  Net 2226.55 ml   Filed Weights   11/23/23 1616 11/24/23 1227  Weight: 99.8 kg 99.8 kg    Examination:  General exam: Overall comfortable, not in distress, pleasant elderly male, obese HEENT: PERRL Respiratory system:  no wheezes or crackles  Cardiovascular system: S1 & S2 heard, RRR.  Gastrointestinal system: Abdomen is nondistended, soft and nontender. Central  nervous system: Alert and oriented Extremities: No edema, no clubbing ,no cyanosis, clean surgical on the left hip Skin: No rashes, no ulcers,no icterus     Data Reviewed: I have personally reviewed following labs and imaging studies  CBC: Recent Labs  Lab 11/23/23 1841 11/24/23 0815 11/25/23 0558  WBC 11.8* 7.0 8.5  NEUTROABS 10.0*  --   --   HGB 11.8* 10.9* 10.1*  HCT 37.1* 33.7* 31.0*  MCV 87.1 87.3 85.4  PLT 209 201 204   Basic Metabolic Panel: Recent Labs  Lab 11/23/23 1841 11/24/23 0815 11/25/23 0558  NA 136 136 133*  K 5.4* 4.4 4.1  CL 106 104 104  CO2 22 24 20*  GLUCOSE 294*  195* 190*  BUN 20 16 19   CREATININE 1.01 0.84 1.01  CALCIUM 8.9 8.4* 8.2*     No results found for this or any previous visit (from the past 240 hours).   Radiology Studies: DG HIP UNILAT WITH PELVIS 2-3 VIEWS RIGHT Result Date: 11/24/2023 CLINICAL DATA:  ORIF of the left femoral fracture. EXAM: DG HIP (WITH OR WITHOUT PELVIS) 2-3V RIGHT COMPARISON:  Left knee radiograph dated 11/23/2023. FINDINGS: Seven intraoperative fluoroscopic spot images provided. The total fluoroscopic time is 57 seconds with a cumulative air Karma of 19.18 mGy. Status post ORIF of left femoral fracture. IMPRESSION: Intraoperative fluoroscopic spot images. Electronically Signed   By: Elgie Collard M.D.   On: 11/24/2023 15:44   DG C-Arm 1-60 Min-No Report Result Date: 11/24/2023 Fluoroscopy was utilized by the requesting physician.  No radiographic interpretation.   DG Hip Unilat W or Wo Pelvis 2-3 Views Left Result Date: 11/23/2023 CLINICAL DATA:  Left hip pain after fall. EXAM: DG HIP (WITH OR WITHOUT PELVIS) 2-3V LEFT COMPARISON:  None Available. FINDINGS: Severely displaced and comminuted fracture is seen involving the intertrochanteric region of the proximal left femur. IMPRESSION: Severely displaced and comminuted intertrochanteric fracture of proximal left femur. Electronically Signed   By: Lupita Raider M.D.   On: 11/23/2023 17:07   DG Chest 1 View Result Date: 11/23/2023 CLINICAL DATA:  Preop. EXAM: CHEST  1 VIEW COMPARISON:  None Available. FINDINGS: Mild cardiomegaly is noted. Both lungs are clear. The visualized skeletal structures are unremarkable. IMPRESSION: No active disease. Electronically Signed   By: Lupita Raider M.D.   On: 11/23/2023 17:06    Scheduled Meds:  acetaminophen  500 mg Oral Q6H   docusate sodium  100 mg Oral BID   enalapril  5 mg Oral Daily   enoxaparin (LOVENOX) injection  40 mg Subcutaneous Q24H   insulin aspart  0-15 Units Subcutaneous Q4H   ketorolac  7.5 mg Intravenous  Q6H   rosuvastatin  20 mg Oral Daily   Continuous Infusions:  sodium chloride 75 mL/hr at 11/24/23 1751     LOS: 2 days   Burnadette Pop, MD [facility] P4/08/2023, 9:21 AM

## 2023-11-25 NOTE — Plan of Care (Signed)
  Problem: Skin Integrity: Goal: Risk for impaired skin integrity will decrease Outcome: Progressing   Problem: Clinical Measurements: Goal: Respiratory complications will improve Outcome: Progressing   Problem: Coping: Goal: Level of anxiety will decrease Outcome: Progressing   Problem: Elimination: Goal: Will not experience complications related to urinary retention Outcome: Progressing

## 2023-11-25 NOTE — Progress Notes (Signed)
  Subjective: 1 Day Post-Op Procedure(s) (LRB): FIXATION, FRACTURE, INTERTROCHANTERIC, WITH INTRAMEDULLARY ROD (Left) Patient reports pain as moderate.   Patient is well, and has had no acute complaints or problems PT and care management to assist with discharge planning. Negative for chest pain and shortness of breath Fever: no Gastrointestinal:Negative for nausea and vomiting Reports he is passing gas.  Objective: Vital signs in last 24 hours: Temp:  [97 F (36.1 C)-99.1 F (37.3 C)] 97.8 F (36.6 C) (04/01 1400) Pulse Rate:  [54-75] 71 (04/01 1400) Resp:  [13-18] 17 (04/01 1400) BP: (75-132)/(52-76) 132/55 (04/01 1400) SpO2:  [96 %-100 %] 97 % (04/01 1400)  Intake/Output from previous day:  Intake/Output Summary (Last 24 hours) at 11/25/2023 1440 Last data filed at 11/25/2023 0400 Gross per 24 hour  Intake 1751.55 ml  Output 225 ml  Net 1526.55 ml    Intake/Output this shift: No intake/output data recorded.  Labs: Recent Labs    11/23/23 1841 11/24/23 0815 11/25/23 0558  HGB 11.8* 10.9* 10.1*   Recent Labs    11/24/23 0815 11/25/23 0558  WBC 7.0 8.5  RBC 3.86* 3.63*  HCT 33.7* 31.0*  PLT 201 204   Recent Labs    11/24/23 0815 11/25/23 0558  NA 136 133*  K 4.4 4.1  CL 104 104  CO2 24 20*  BUN 16 19  CREATININE 0.84 1.01  GLUCOSE 195* 190*  CALCIUM 8.4* 8.2*   No results for input(s): "LABPT", "INR" in the last 72 hours.   EXAM General - Patient is Alert, Appropriate, and Oriented Extremity - ABD soft Neurovascular intact Incision: dressing C/D/I No cellulitis present Compartment soft Dressing/Incision - clean, dry, no drainage noted to the left leg honeycomb dressings. Motor Function - intact, moving foot and toes well on exam.  Abdomen soft with intact bowel sounds.  Past Medical History:  Diagnosis Date   Cataracts, bilateral    Degenerative joint disease of knee, left    Diabetes mellitus, type 2 (HCC)    Diverticulosis    Erectile  dysfunction    GERD (gastroesophageal reflux disease)    Heme positive stool    Hyperlipidemia    Hypertension    Lumbar disc herniation    Overweight (BMI 25.0-29.9)     Assessment/Plan: 1 Day Post-Op Procedure(s) (LRB): FIXATION, FRACTURE, INTERTROCHANTERIC, WITH INTRAMEDULLARY ROD (Left) Principal Problem:   Closed left hip fracture, initial encounter (HCC) Active Problems:   Diabetes mellitus, type 2 (HCC)   Hypertension   Hyperlipidemia   History of DVT (deep vein thrombosis)   Accidental fall   Closed left hip fracture (HCC)  Estimated body mass index is 30.69 kg/m as calculated from the following:   Height as of this encounter: 5\' 11"  (1.803 m).   Weight as of this encounter: 99.8 kg. Advance diet Up with therapy D/C IV fluids when tolerating po intake.  Labs and vitals reviewed. Hg 10.1.  HCT 31.0. Up with therapy, possible d/c tomorrow pending how he performs with PT. Might need SNF. Continue to work on a BM.  DVT Prophylaxis - Lovenox and TED hose Weight-Bearing as tolerated to left leg  J. Horris Latino, PA-C [facility] 11/25/2023, 2:40 PM

## 2023-11-25 NOTE — Evaluation (Signed)
 Occupational Therapy Evaluation Patient Details Name: Douglas Lyons MRN: [id number] DOB: 01-23-1946 Today's Date: 11/25/2023   History of Present Illness   Pt is a 78 year old male admitted with L hip fracture, now s/p  Reduction and internal fixation of displaced intertrochanteric left hip fracture 11/24/23    PMH significant for HTN, HLD, DM, prior UTIs, prior history of DVT no longer on anticoagulation,     Clinical Impressions Chart reviewed, pt greeted in bed, alert and oriented x4, agreeable to OT evaluation. PTA pt is MOD I-I in ADL/.IADL, amb with PRN use of SPC when on uneven surfaces per his report. Pt presents with deficits in strength, endurance, activity tolerance, balance, affecting safe and optimal ADL completion. Bed mobility complete with MIN A, STS with CGA-MIN A, amb in room approx 8' with CGA-MIN A with RW, intermittent vcs for technique. MAX A required for LB dressing, SET UP for feeding/grooming tasks. Discussed/educated pt re: discharge recommendations and DME for safe ADL completion. Pt is performing ADL below PLOF, will benefit from acute OT to address functional deficits. OT will continue to follow.      If plan is discharge home, recommend the following:   A little help with walking and/or transfers;A little help with bathing/dressing/bathroom;Help with stairs or ramp for entrance;Assistance with cooking/housework     Functional Status Assessment   Patient has had a recent decline in their functional status and demonstrates the ability to make significant improvements in function in a reasonable and predictable amount of time.     Equipment Recommendations   BSC/3in1;Tub/shower bench     Recommendations for Other Services         Precautions/Restrictions   Precautions Precautions: Fall Recall of Precautions/Restrictions: Intact Restrictions Weight Bearing Restrictions Per Provider Order: Yes LLE Weight Bearing Per Provider Order: Weight bearing  as tolerated     Mobility Bed Mobility Overal bed mobility: Needs Assistance Bed Mobility: Supine to Sit     Supine to sit: Min assist, Used rails, HOB elevated, Contact guard     General bed mobility comments: intermittent vcs for technique    Transfers Overall transfer level: Needs assistance Equipment used: Rolling walker (2 wheels) Transfers: Sit to/from Stand Sit to Stand: Contact guard assist, Min assist                  Balance Overall balance assessment: Needs assistance Sitting-balance support: Feet supported Sitting balance-Leahy Scale: Good     Standing balance support: Bilateral upper extremity supported, During functional activity, Reliant on assistive device for balance Standing balance-Leahy Scale: Fair                             ADL either performed or assessed with clinical judgement   ADL Overall ADL's : Needs assistance/impaired Eating/Feeding: Set up;Sitting   Grooming: Sitting;Set up               Lower Body Dressing: Moderate assistance Lower Body Dressing Details (indicate cue type and reason): donn/doff socks sitting in chair Toilet Transfer: Contact guard assist;Rolling walker (2 wheels);Ambulation Toilet Transfer Details (indicate cue type and reason): intermittent vcs for technique         Functional mobility during ADLs: Contact guard assist;Minimal assistance;Rolling walker (2 wheels) (approx 8' with intermittent vcs for RW use)       Vision Patient Visual Report: No change from baseline       Perception  Praxis         Pertinent Vitals/Pain Pain Assessment Pain Assessment: 0-10 Pain Score: 2  Pain Location: L hip Pain Descriptors / Indicators: Discomfort Pain Intervention(s): Monitored during session, Repositioned     Extremity/Trunk Assessment Upper Extremity Assessment Upper Extremity Assessment: Overall WFL for tasks assessed   Lower Extremity Assessment Lower Extremity  Assessment: LLE deficits/detail;Defer to PT evaluation LLE Deficits / Details: unable to perform SLR in bed       Communication Communication Communication: No apparent difficulties   Cognition Arousal: Alert Behavior During Therapy: WFL for tasks assessed/performed Cognition: No apparent impairments                               Following commands: Intact       Cueing  General Comments   Cueing Techniques: Verbal cues;Gestural cues  vss throughout, incision x2 intact pre/post session   Exercises Other Exercises Other Exercises: edu re: role of OT, role of rehab, discharge recommendaitons   Shoulder Instructions      Home Living Family/patient expects to be discharged to:: Private residence Living Arrangements: Spouse/significant other Available Help at Discharge: Family;Available 24 hours/day Type of Home: House Home Access: Stairs to enter [employer] of Steps: 3 Entrance Stairs-Rails: Can reach both Home Layout: One level     Bathroom Shower/Tub: Chief Strategy Officer: Handicapped height Bathroom Accessibility: Yes   Home Equipment: Agricultural consultant (2 wheels);Cane - single point   Additional Comments: equipment is from family members per pt report      Prior Functioning/Environment Prior Level of Function : Independent/Modified Independent             Mobility Comments: works FT, amb with PRN use of SPC on uneven surfaces per pt report ADLs Comments: MOD I-I in ADL/IADL    OT Problem List: Decreased activity tolerance;Decreased knowledge of use of DME or AE;Impaired balance (sitting and/or standing)   OT Treatment/Interventions: Self-care/ADL training;DME and/or AE instruction;Therapeutic activities;Balance training;Therapeutic exercise;Patient/family education      OT Goals(Current goals can be found in the care plan section)   Acute Rehab OT Goals Patient Stated Goal: go home OT Goal Formulation: With  patient Time For Goal Achievement: 12/09/23 Potential to Achieve Goals: Good ADL Goals Pt Will Perform Grooming: with modified independence;standing Pt Will Perform Lower Body Dressing: with modified independence;sitting/lateral leans;sit to/from stand Pt Will Transfer to Toilet: with modified independence;ambulating   OT Frequency:  Min 3X/week    Co-evaluation              AM-PAC OT "6 Clicks" Daily Activity     Outcome Measure Help from another person eating meals?: None Help from another person taking care of personal grooming?: None Help from another person toileting, which includes using toliet, bedpan, or urinal?: A Little Help from another person bathing (including washing, rinsing, drying)?: A Little Help from another person to put on and taking off regular upper body clothing?: None Help from another person to put on and taking off regular lower body clothing?: A Lot 6 Click Score: 20   End of Session Equipment Utilized During Treatment: Gait belt;Rolling walker (2 wheels) Nurse Communication: Mobility status  Activity Tolerance: Patient tolerated treatment well Patient left: in chair;with call bell/phone within reach  OT Visit Diagnosis: Other abnormalities of gait and mobility (R26.89)                Time: 0840-0900 OT  Time Calculation (min): 20 min Charges:  OT General Charges $OT Visit: 1 Visit OT Evaluation $OT Eval Low Complexity: 1 Low  Oleta Mouse, OTD OTR/L  11/25/23, 10:54 AM

## 2023-11-25 NOTE — NC FL2 (Signed)
 [place] MEDICAID FL2 LEVEL OF CARE FORM     IDENTIFICATION  Patient Name: Douglas Lyons Birthdate: 1946-02-01 Sex: male Admission Date (Current Location): 11/23/2023  [facility] and [place] Number:  Chiropodist and Address:  [facility], [address] [place], [place] [address]      Provider Number: [id number]  Attending Physician Name and Address:  Burnadette Pop, MD  Relative Name and Phone Number:  FLORENCE ANTONELLI  Spouse  [phone]  [address]  [place] [address]    Current Level of Care: Hospital Recommended Level of Care: Skilled Nursing Facility Prior Approval Number:    Date Approved/Denied:   PASRR Number: [id number]  Discharge Plan: SNF    Current Diagnoses: Patient Active Problem List   Diagnosis Date Noted   Closed left hip fracture, initial encounter (HCC) 11/23/2023   Accidental fall 11/23/2023   Closed left hip fracture (HCC) 11/23/2023   Melena 10/08/2021   Physical deconditioning 10/08/2021   Sepsis secondary to UTI (HCC) 10/07/2021   Hyponatremia 10/07/2021   Leukocytosis 10/07/2021   Acute pyelonephritis 10/07/2021   Pyelonephritis 10/06/2021   History of DVT (deep vein thrombosis) 10/06/2021   Hyperlipidemia 01/26/2021   Overweight (BMI 25.0-29.9) 01/26/2021   Acute deep vein thrombosis (DVT) of popliteal vein of right lower extremity (HCC) 01/26/2021   Erectile dysfunction 11/28/2020   Gait instability 11/27/2020   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   Diabetes mellitus, type 2 (HCC)    Hypertension    GERD (gastroesophageal reflux disease)    History of nonmelanoma skin cancer 03/13/2020    Orientation RESPIRATION BLADDER Height & Weight     Self, Time, Situation, Place  Normal Continent Weight: 99.8 kg Height:  5\' 11"  (180.3 cm)  BEHAVIORAL SYMPTOMS/MOOD NEUROLOGICAL BOWEL NUTRITION STATUS      Continent Diet (Heart Healthy Carb modified)  AMBULATORY STATUS  COMMUNICATION OF NEEDS Skin   Extensive Assist Verbally Normal, Surgical wounds                       Personal Care Assistance Level of Assistance  Bathing, Feeding, Dressing Bathing Assistance: Limited assistance Feeding assistance: Independent Dressing Assistance: Limited assistance     Functional Limitations Info  Sight, Hearing, Speech Sight Info: Adequate Hearing Info: Adequate Speech Info: Adequate    SPECIAL CARE FACTORS FREQUENCY  PT (By licensed PT), OT (By licensed OT)     PT Frequency: 5 times per week OT Frequency: 5 times per week            Contractures Contractures Info: Not present    Additional Factors Info  Code Status, Allergies Code Status Info: full code Allergies Info: Rosuvastatin           Current Medications (11/25/2023):  This is the current hospital active medication list Current Facility-Administered Medications  Medication Dose Route Frequency Provider Last Rate Last Admin   0.9 %  sodium chloride infusion   Intravenous Continuous Poggi, Excell Seltzer, MD 75 mL/hr at 11/24/23 1751 Infusion Verify at 11/24/23 1751   acetaminophen (TYLENOL) tablet 325-650 mg  325-650 mg Oral Q6H PRN Poggi, Excell Seltzer, MD       acetaminophen (TYLENOL) tablet 500 mg  500 mg Oral Q6H Poggi, Excell Seltzer, MD   500 mg at 11/25/23 1353   bisacodyl (DULCOLAX) suppository 10 mg  10 mg Rectal Daily PRN Poggi, Excell Seltzer, MD       diphenhydrAMINE (BENADRYL)  12.5 MG/5ML elixir 12.5-25 mg  12.5-25 mg Oral Q4H PRN Poggi, Excell Seltzer, MD       docusate sodium (COLACE) capsule 100 mg  100 mg Oral BID Poggi, Excell Seltzer, MD   100 mg at 11/25/23 0911   enalapril (VASOTEC) tablet 5 mg  5 mg Oral Daily Barrie Folk, RPH   5 mg at 11/25/23 1109   enoxaparin (LOVENOX) injection 40 mg  40 mg Subcutaneous Q24H Poggi, Excell Seltzer, MD   40 mg at 11/25/23 6962   HYDROcodone-acetaminophen (NORCO/VICODIN) 5-325 MG per tablet 1-2 tablet  1-2 tablet Oral Q6H PRN Christena Flake, MD   2 tablet at 11/25/23 0034    insulin aspart (novoLOG) injection 0-15 Units  0-15 Units Subcutaneous Q4H Poggi, Excell Seltzer, MD   5 Units at 11/25/23 1218   magnesium hydroxide (MILK OF MAGNESIA) suspension 30 mL  30 mL Oral Daily PRN Poggi, Excell Seltzer, MD       methocarbamol (ROBAXIN) tablet 500 mg  500 mg Oral Q6H PRN Poggi, Excell Seltzer, MD   500 mg at 11/24/23 2118   Or   methocarbamol (ROBAXIN) injection 500 mg  500 mg Intravenous Q6H PRN Poggi, Excell Seltzer, MD   500 mg at 11/24/23 0121   metoCLOPramide (REGLAN) tablet 5-10 mg  5-10 mg Oral Q8H PRN Poggi, Excell Seltzer, MD       Or   metoCLOPramide (REGLAN) injection 5-10 mg  5-10 mg Intravenous Q8H PRN Poggi, Excell Seltzer, MD       morphine (PF) 2 MG/ML injection 1 mg  1 mg Intravenous Q2H PRN Poggi, Excell Seltzer, MD   1 mg at 11/24/23 0841   ondansetron (ZOFRAN) tablet 4 mg  4 mg Oral Q6H PRN Poggi, Excell Seltzer, MD       Or   ondansetron (ZOFRAN) injection 4 mg  4 mg Intravenous Q6H PRN Poggi, Excell Seltzer, MD       polyethylene glycol (MIRALAX / GLYCOLAX) packet 17 g  17 g Oral Daily Adhikari, Amrit, MD   17 g at 11/25/23 1110   rosuvastatin (CRESTOR) tablet 20 mg  20 mg Oral Daily Poggi, Excell Seltzer, MD   20 mg at 11/25/23 0911   senna-docusate (Senokot-S) tablet 1 tablet  1 tablet Oral BID Burnadette Pop, MD   1 tablet at 11/25/23 1110   sodium phosphate (FLEET) enema 1 enema  1 enema Rectal Once PRN Poggi, Excell Seltzer, MD         Discharge Medications: Please see discharge summary for a list of discharge medications.  Relevant Imaging Results:  Relevant Lab Results:   Additional Information SS# [id number]  Marlowe Sax, RN

## 2023-11-25 NOTE — TOC Progression Note (Signed)
 Transition of Care [place]) - Progression Note    Patient Details  Name: Douglas Lyons MRN: [id number] Date of Birth: 03-05-1946  [facility]) CM/SW Contact  Marlowe Sax, RN Phone Number: 11/25/2023, 2:49 PM  Clinical Narrative:     Sent bed search for STR will review bed offers once obtained       Expected Discharge Plan and Services                                               Social Determinants of Health (SDOH) Interventions SDOH Screenings   Food Insecurity: No Food Insecurity (11/24/2023)  Housing: Low Risk  (11/24/2023)  Transportation Needs: No Transportation Needs (11/24/2023)  Utilities: Not At Risk (11/24/2023)  Financial Resource Strain: Low Risk  (06/25/2023)   [facility] from [facility]  Physical Activity: Unknown (06/05/2022)   Received from [facility]  Social Connections: Socially Integrated (11/24/2023)  Tobacco Use: Low Risk  (11/24/2023)    Readmission Risk Interventions     No data to display

## 2023-11-25 NOTE — Plan of Care (Signed)
   Problem: Education: Goal: Ability to describe self-care measures that may prevent or decrease complications (Diabetes Survival Skills Education) will improve Outcome: Progressing   Problem: Coping: Goal: Ability to adjust to condition or change in health will improve Outcome: Progressing   Problem: Fluid Volume: Goal: Ability to maintain a balanced intake and output will improve Outcome: Progressing   Problem: Skin Integrity: Goal: Risk for impaired skin integrity will decrease Outcome: Progressing   Problem: Tissue Perfusion: Goal: Adequacy of tissue perfusion will improve Outcome: Progressing

## 2023-11-25 NOTE — Anesthesia Postprocedure Evaluation (Signed)
 Anesthesia Post Note  Patient: MELIK BLANCETT  Procedure(s) Performed: FIXATION, FRACTURE, INTERTROCHANTERIC, WITH INTRAMEDULLARY ROD (Left: Hip)  Patient location during evaluation: Nursing Unit Anesthesia Type: Spinal Level of consciousness: oriented and awake and alert Pain management: pain level controlled Vital Signs Assessment: post-procedure vital signs reviewed and stable Respiratory status: spontaneous breathing and respiratory function stable Cardiovascular status: blood pressure returned to baseline and stable Postop Assessment: no headache, no backache and no apparent nausea or vomiting Anesthetic complications: no   There were no known notable events for this encounter.   Last Vitals:  Vitals:   11/25/23 0015 11/25/23 0433  BP: 123/76 (!) 95/55  Pulse: 68 69  Resp: 18 16  Temp: 37.1 C 37.2 C  SpO2: 99% 97%    Last Pain:  Vitals:   11/25/23 0433  TempSrc: Temporal  PainSc:                  Jules Schick

## 2023-11-26 DIAGNOSIS — S72002A Fracture of unspecified part of neck of left femur, initial encounter for closed fracture: Secondary | ICD-10-CM | POA: Diagnosis not present

## 2023-11-26 LAB — GLUCOSE, CAPILLARY
Glucose-Capillary: 183 mg/dL — ABNORMAL HIGH (ref 70–99)
Glucose-Capillary: 216 mg/dL — ABNORMAL HIGH (ref 70–99)

## 2023-11-26 LAB — CBC
HCT: 27.4 % — ABNORMAL LOW (ref 39.0–52.0)
Hemoglobin: 9.2 g/dL — ABNORMAL LOW (ref 13.0–17.0)
MCH: 28.7 pg (ref 26.0–34.0)
MCHC: 33.6 g/dL (ref 30.0–36.0)
MCV: 85.4 fL (ref 80.0–100.0)
Platelets: 169 10*3/uL (ref 150–400)
RBC: 3.21 MIL/uL — ABNORMAL LOW (ref 4.22–5.81)
RDW: 14 % (ref 11.5–15.5)
WBC: 6.3 10*3/uL (ref 4.0–10.5)
nRBC: 0 % (ref 0.0–0.2)

## 2023-11-26 MED ORDER — POLYETHYLENE GLYCOL 3350 17 G PO PACK: 17.0000 g | PACK | Freq: Every day | ORAL | Status: DC

## 2023-11-26 MED ORDER — BISACODYL 10 MG RE SUPP: 10.0000 mg | Freq: Every day | RECTAL | Status: DC | PRN

## 2023-11-26 MED ORDER — ONDANSETRON HCL 4 MG PO TABS: 4.0000 mg | ORAL_TABLET | Freq: Four times a day (QID) | ORAL | Status: DC | PRN

## 2023-11-26 MED ORDER — TIZANIDINE HCL 4 MG PO TABS: 2.0000 mg | ORAL_TABLET | Freq: Four times a day (QID) | ORAL | Status: DC | PRN

## 2023-11-26 MED ORDER — SENNOSIDES-DOCUSATE SODIUM 8.6-50 MG PO TABS: 1.0000 | ORAL_TABLET | Freq: Two times a day (BID) | ORAL | Status: DC

## 2023-11-26 MED ORDER — INSULIN ASPART 100 UNIT/ML IJ SOLN: 0.0000 [IU] | Freq: Three times a day (TID) | INTRAMUSCULAR | Status: DC

## 2023-11-26 MED ORDER — ACETAMINOPHEN 325 MG PO TABS: 325.0000 mg | ORAL_TABLET | Freq: Four times a day (QID) | ORAL | Status: DC | PRN

## 2023-11-26 MED ORDER — INSULIN ASPART 100 UNIT/ML IJ SOLN: 3.0000 [IU] | Freq: Three times a day (TID) | INTRAMUSCULAR | Status: DC

## 2023-11-26 MED ORDER — DOCUSATE SODIUM 100 MG PO CAPS: 100.0000 mg | ORAL_CAPSULE | Freq: Two times a day (BID) | ORAL | Status: DC

## 2023-11-26 MED ORDER — ROSUVASTATIN CALCIUM 20 MG PO TABS: 20.0000 mg | ORAL_TABLET | Freq: Every day | ORAL | Status: DC

## 2023-11-26 MED ORDER — FLEET ENEMA RE ENEM: 1.0000 | ENEMA | Freq: Once | RECTAL | Status: DC | PRN

## 2023-11-26 NOTE — Progress Notes (Signed)
 Occupational Therapy Treatment Patient Details Name: Douglas Lyons MRN: [id number] DOB: May 29, 1946 Today's Date: 11/26/2023   History of present illness Pt is a 78 year old male admitted with L hip fracture, now s/p  Reduction and internal fixation of displaced intertrochanteric left hip fracture 11/24/23    PMH significant for HTN, HLD, DM, prior UTIs, prior history of DVT no longer on anticoagulation,   OT comments  Chart reviewed to date, pt greeted in chair, agreeable to OT tx session targeting improving functional mobility in preparation for ADL performance. Slightly increased assist required for STS on this date requiring CGA-MIN A and frequent vcs for technique. Pt amb to bathroom approx 20' with RW with CGA-MIN A. Pt reports feeling uncomfortable and stiff throughout LLE. MIN A required for UB dressing. Discussed discharge recommendations, all questions answered. Pt is making progress towards goals, discharge recommendation remains appropriate.       If plan is discharge home, recommend the following:  A little help with walking and/or transfers;Help with stairs or ramp for entrance;Assistance with cooking/housework;A lot of help with bathing/dressing/bathroom   Equipment Recommendations  BSC/3in1;Tub/shower bench    Recommendations for Other Services      Precautions / Restrictions Precautions Precautions: Fall Recall of Precautions/Restrictions: Intact Restrictions Weight Bearing Restrictions Per Provider Order: Yes LLE Weight Bearing Per Provider Order: Weight bearing as tolerated       Mobility Bed Mobility               General bed mobility comments: NT in recliner pre/post session    Transfers Overall transfer level: Needs assistance Equipment used: Rolling walker (2 wheels) Transfers: Sit to/from Stand Sit to Stand: Contact guard assist, Min assist           General transfer comment: frequent vcs for technique     Balance Overall balance assessment:  Needs assistance Sitting-balance support: Feet supported Sitting balance-Leahy Scale: Normal     Standing balance support: Bilateral upper extremity supported, During functional activity, Reliant on assistive device for balance Standing balance-Leahy Scale: Fair                             ADL either performed or assessed with clinical judgement   ADL Overall ADL's : Needs assistance/impaired Eating/Feeding: Set up;Sitting   Grooming: Set up;Sitting           Upper Body Dressing : Minimal assistance;Sitting       Toilet Transfer: Contact guard assist;Minimal assistance;Rolling walker (2 wheels);Ambulation Toilet Transfer Details (indicate cue type and reason): simulated to bedside chair, intermittent vcs for RW technique Toileting- Clothing Manipulation and Hygiene: Moderate assistance;Sit to/from stand Toileting - Clothing Manipulation Details (indicate cue type and reason): anticipate     Functional mobility during ADLs: Contact guard assist;Minimal assistance;Rolling walker (2 wheels) (approx 20' in room)      Extremity/Trunk Assessment              Vision       Perception     Praxis     Communication Communication Communication: No apparent difficulties   Cognition Arousal: Alert Behavior During Therapy: WFL for tasks assessed/performed Cognition: No apparent impairments                               Following commands: Intact        Cueing   Cueing Techniques: Verbal cues  Exercises  Other Exercises Other Exercises: edu re: importance of progressing mobility and continued attempts at ADL participation/mobility, discharge recommendations    Shoulder Instructions       General Comments vss throughout    Pertinent Vitals/ Pain       Pain Assessment Pain Assessment: 0-10 Pain Score: 10-Worst pain ever Pain Location: L hip with mobility, 3/10 at rest Pain Descriptors / Indicators: Discomfort, Sore Pain  Intervention(s): Limited activity within patient's tolerance, Monitored during session (notified nurse)  Home Living                                          Prior Functioning/Environment              Frequency  Min 3X/week        Progress Toward Goals  OT Goals(current goals can now be found in the care plan section)  Progress towards OT goals: Progressing toward goals  Acute Rehab OT Goals Time For Goal Achievement: 12/09/23  Plan      Co-evaluation                 AM-PAC OT "6 Clicks" Daily Activity     Outcome Measure   Help from another person eating meals?: None Help from another person taking care of personal grooming?: None Help from another person toileting, which includes using toliet, bedpan, or urinal?: A Little Help from another person bathing (including washing, rinsing, drying)?: A Little Help from another person to put on and taking off regular upper body clothing?: None Help from another person to put on and taking off regular lower body clothing?: A Lot 6 Click Score: 20    End of Session Equipment Utilized During Treatment: Gait belt;Rolling walker (2 wheels)  OT Visit Diagnosis: Other abnormalities of gait and mobility (R26.89)   Activity Tolerance Patient tolerated treatment well   Patient Left in chair;with call bell/phone within reach   Nurse Communication Mobility status (pain)        Time: 1610-9604 OT Time Calculation (min): 19 min  Charges: OT General Charges $OT Visit: 1 Visit OT Treatments $Therapeutic Activity: 8-22 mins  Oleta Mouse, OTD OTR/L  11/26/23, 11:13 AM

## 2023-11-26 NOTE — TOC Progression Note (Signed)
 Transition of Care [facility]) - Progression Note    Patient Details  Name: DOUGLAS SMOLINSKY MRN: [id number] Date of Birth: 11/23/1945  Transition [facility]) CM/SW Contact  Marlowe Sax, RN Phone Number: 11/26/2023, 1:48 PM  Clinical Narrative:     Reviewed the bed offers with the patient and [facility] wife [facility] notified [facility] at [facility]         Expected Discharge Plan and Services                                               Social Determinants of Health (SDOH) Interventions SDOH Screenings   Food Insecurity: No Food Insecurity (11/24/2023)  Housing: Low Risk  (11/24/2023)  Transportation Needs: No Transportation Needs (11/24/2023)  Utilities: Not At Risk (11/24/2023)  Financial Resource Strain: Low Risk  (06/25/2023)   Received from [facility]  Physical Activity: Unknown (06/05/2022)   Received from [facility]  Social Connections: Socially Integrated (11/24/2023)  Tobacco Use: Low Risk  (11/24/2023)    Readmission Risk Interventions     No data to display

## 2023-11-26 NOTE — Plan of Care (Signed)
  Problem: Education: Goal: Ability to describe self-care measures that may prevent or decrease complications (Diabetes Survival Skills Education) will improve Outcome: Progressing   Problem: Coping: Goal: Ability to adjust to condition or change in health will improve Outcome: Progressing   Problem: Health Behavior/Discharge Planning: Goal: Ability to identify and utilize available resources and services will improve Outcome: Progressing   Problem: Metabolic: Goal: Ability to maintain appropriate glucose levels will improve Outcome: Progressing   Problem: Skin Integrity: Goal: Risk for impaired skin integrity will decrease Outcome: Progressing   Problem: Education: Goal: Knowledge of General Education information will improve Description: Including pain rating scale, medication(s)/side effects and non-pharmacologic comfort measures Outcome: Progressing

## 2023-11-26 NOTE — TOC Progression Note (Addendum)
 Transition of Care [facility]) - Progression Note    Patient Details  Name: Douglas Lyons MRN: [id number] Date of Birth: Apr 03, 1946  Transition of Care [facility]) CM/SW Contact  Marlowe Sax, RN Phone Number: 11/26/2023, 3:01 PM  Clinical Narrative:     [facility]        Expected Discharge Plan and Services         Expected Discharge Date: 11/26/23                                     Social Determinants of Health (SDOH) Interventions SDOH Screenings   Food Insecurity: No Food Insecurity (11/24/2023)  Housing: Low Risk  (11/24/2023)  Transportation Needs: No Transportation Needs (11/24/2023)  Utilities: Not At Risk (11/24/2023)  Financial Resource Strain: Low Risk  (06/25/2023)   Received from [facility]  Physical Activity: Unknown (06/05/2022)   Received from [facility]  Social Connections: Socially Integrated (11/24/2023)  Tobacco Use: Low Risk  (11/24/2023)    Readmission Risk Interventions     No data to display

## 2023-11-26 NOTE — Discharge Summary (Signed)
 Physician Discharge Summary   Patient: Douglas Lyons MRN: [id number]  DOB: [birth year]   Admit:     Date of Admission: 11/23/2023 Admitted from: home   Discharge: Date of discharge: 11/26/23 Disposition: Skilled nursing facility Condition at discharge: good  CODE STATUS: FULL CODE     Discharge Physician: Sunnie Nielsen, DO [facility]     PCP: Myrene Buddy, NP  Recommendations for Outpatient Follow-up:  Follow up with PCP Gauger, Hermenia Fiscal, NP in 2-4 weeks for hospital follow up  Follow up with orthopedics 10-14 days w/ Dr Binnie Rail office  Please obtain labs/tests: CBC, BMP in 1-2 weeks Please follow up on the following pending results: none PCP AND OTHER OUTPATIENT PROVIDERS: SEE BELOW FOR SPECIFIC DISCHARGE INSTRUCTIONS PRINTED FOR PATIENT IN ADDITION TO GENERIC AVS PATIENT INFO    Discharge Instructions     Diet Carb Modified   Complete by: As directed    Increase activity slowly   Complete by: As directed          Discharge Diagnoses: Principal Problem:   Closed left hip fracture, initial encounter (HCC) Active Problems:   Accidental fall   Diabetes mellitus, type 2 (HCC)   Hypertension   Hyperlipidemia   History of DVT (deep vein thrombosis)   Closed left hip fracture [facility])      Hospital course / significant events:   HPI: Patient is a 78 y.o. male with medical history significant for HTN, HLD, DM, prior UTIs, prior history of DVT no longer on anticoagulation, presents w/ L hip pain following an accidental fall when he tripped over a root while working in the woods.    03/30: admitted to hospitalist service with a left fracture  03/31: Reduction and internal fixation of displaced intertrochanteric left hip fracture with Biomet Affixis TFN nail.  04/01-04/02: stable, await SNF     Consultants:  Orthopedics   Procedures/Surgeries: 11/24/23: Reduction and internal fixation of displaced intertrochanteric left hip  fracture with Biomet Affixis TFN nail w/ Dr Joice Lofts       ASSESSMENT & PLAN:   Closed left hip fracture Displaced and comminuted intertrochanteric fracture of the proximal left femur.   Status post ORIF.   PT/OT.   pain management, supportive care bowel regimen Lovenox DVT ppx     Hyperlipidemia Continue rosuvastatin   Hypertension Continue enalapril   Diabetes mellitus Sliding scale insulin coverage --> home meds on discharge               Discharge Instructions  Allergies as of 11/26/2023       Reactions   Rosuvastatin Other (See Comments)   Other reaction(s): Muscle Pain Is taking every other day and he feels much better as of 12/30/2016 Is taking every other day and he feels much better as of 12/30/2016 Is taking every other day and he feels much better as of 12/30/2016 Other reaction(s): Muscle Pain Is taking every other day and he feels much better as of 12/30/2016 Is taking every other day and he feels much better as of 12/30/2016        Medication List     STOP taking these medications    acetaminophen 650 MG CR tablet Commonly known as: TYLENOL Replaced by: acetaminophen 325 MG tablet       TAKE these medications    acetaminophen 325 MG tablet Commonly known as: TYLENOL Take 1-2 tablets (325-650 mg total) by mouth every 6 (six) hours as needed for mild pain (pain  score 1-3) (or temp > 100.5). Replaces: acetaminophen 650 MG CR tablet   aspirin EC 81 MG tablet Take 81 mg by mouth daily. Swallow whole.   bisacodyl 10 MG suppository Commonly known as: DULCOLAX Place 1 suppository (10 mg total) rectally daily as needed for moderate constipation.   cholecalciferol 25 MCG (1000 UNIT) tablet Commonly known as: VITAMIN D3 Take 1,000 Units by mouth daily.   docusate sodium 100 MG capsule Commonly known as: COLACE Take 1 capsule (100 mg total) by mouth 2 (two) times daily.   enalapril 5 MG tablet Commonly known as: VASOTEC Take 1  tablet by mouth daily.   enoxaparin 40 MG/0.4ML injection Commonly known as: LOVENOX Inject 0.4 mLs (40 mg total) into the skin daily.   glipiZIDE-metformin 2.5-500 MG tablet Commonly known as: METAGLIP Take 1 tablet by mouth 2 (two) times daily before a meal.   HYDROcodone-acetaminophen 5-325 MG tablet Commonly known as: NORCO/VICODIN Take 1-2 tablets by mouth every 6 (six) hours as needed for moderate pain (pain score 4-6).   meloxicam 15 MG tablet Commonly known as: MOBIC Take 15 mg by mouth daily.   ondansetron 4 MG tablet Commonly known as: ZOFRAN Take 1 tablet (4 mg total) by mouth every 6 (six) hours as needed for nausea.   polyethylene glycol 17 g packet Commonly known as: MIRALAX / GLYCOLAX Take 17 g by mouth daily. Start taking on: November 27, 2023   rosuvastatin 20 MG tablet Commonly known as: CRESTOR Take 1 tablet (20 mg total) by mouth daily. What changed: Another medication with the same name was removed. Continue taking this medication, and follow the directions you see here.   senna-docusate 8.6-50 MG tablet Commonly known as: Senokot-S Take 1 tablet by mouth 2 (two) times daily. While on opiate pain medication   sodium phosphate Enem Place 133 mLs (1 enema total) rectally once as needed for severe constipation.   tiZANidine 4 MG tablet Commonly known as: ZANAFLEX Take 0.5-1 tablets (2-4 mg total) by mouth every 6 (six) hours as needed for muscle spasms.         Follow-up Information     Anson Oregon, PA-C Follow up in 14 day(s).   Specialty: Physician Assistant Why: Staple Removal and x-rays of the right femur. Contact information: [address] [place] [address] [phone]                 Allergies  Allergen Reactions   Rosuvastatin Other (See Comments)    Other reaction(s): Muscle Pain Is taking every other day and he feels much better as of 12/30/2016 Is taking every other day and he feels much better as of  12/30/2016  Is taking every other day and he feels much better as of 12/30/2016 Other reaction(s): Muscle Pain Is taking every other day and he feels much better as of 12/30/2016 Is taking every other day and he feels much better as of 12/30/2016     Subjective: pt feeling well this afternoon, no concerns, tolerating diet, pain is controlled, no CP/SOB.    Discharge Exam: BP (!) 136/59 (BP Location: Right Arm)   Pulse 77   Temp 98.6 F (37 C) (Oral)   Resp 18   Ht 5\' 11"  (1.803 m)   Wt 99.8 kg   SpO2 97%   BMI 30.69 kg/m  General: Pt is alert, awake, not in acute distress Cardiovascular: RRR, S1/S2 +, no rubs, no gallops Respiratory: CTA bilaterally, no wheezing, no rhonchi Abdominal: Soft,  NT, ND, bowel sounds + Extremities: no edema, no cyanosis     The results of significant diagnostics from this hospitalization (including imaging, microbiology, ancillary and laboratory) are listed below for reference.     Microbiology: No results found for this or any previous visit (from the past 240 hours).   Labs: BNP (last 3 results) No results for input(s): "BNP" in the last 8760 hours. Basic Metabolic Panel: Recent Labs  Lab 11/23/23 1841 11/24/23 0815 11/25/23 0558  NA 136 136 133*  K 5.4* 4.4 4.1  CL 106 104 104  CO2 22 24 20*  GLUCOSE 294* 195* 190*  BUN 20 16 19   CREATININE 1.01 0.84 1.01  CALCIUM 8.9 8.4* 8.2*   Liver Function Tests: Recent Labs  Lab 11/23/23 1841  AST 16  ALT 14  ALKPHOS 51  BILITOT 0.6  PROT 6.1*  ALBUMIN 3.6   No results for input(s): "LIPASE", "AMYLASE" in the last 168 hours. No results for input(s): "AMMONIA" in the last 168 hours. CBC: Recent Labs  Lab 11/23/23 1841 11/24/23 0815 11/25/23 0558 11/26/23 0719  WBC 11.8* 7.0 8.5 6.3  NEUTROABS 10.0*  --   --   --   HGB 11.8* 10.9* 10.1* 9.2*  HCT 37.1* 33.7* 31.0* 27.4*  MCV 87.1 87.3 85.4 85.4  PLT 209 201 204 169   Cardiac Enzymes: Recent Labs  Lab 11/24/23 0815   CKTOTAL 49   BNP: Invalid input(s): "POCBNP" CBG: Recent Labs  Lab 11/25/23 1143 11/25/23 1611 11/25/23 2112 11/26/23 0720 11/26/23 1151  GLUCAP 228* 241* 209* 183* 216*   D-Dimer No results for input(s): "DDIMER" in the last 72 hours. Hgb A1c Recent Labs    11/23/23 1841  HGBA1C 8.2*   Lipid Profile No results for input(s): "CHOL", "HDL", "LDLCALC", "TRIG", "CHOLHDL", "LDLDIRECT" in the last 72 hours. Thyroid function studies No results for input(s): "TSH", "T4TOTAL", "T3FREE", "THYROIDAB" in the last 72 hours.  Invalid input(s): "FREET3" Anemia work up No results for input(s): "VITAMINB12", "FOLATE", "FERRITIN", "TIBC", "IRON", "RETICCTPCT" in the last 72 hours. Urinalysis    Component Value Date/Time   COLORURINE YELLOW (A) 10/06/2021 1454   APPEARANCEUR HAZY (A) 10/06/2021 1454   LABSPEC 1.024 10/06/2021 1454   PHURINE 5.0 10/06/2021 1454   GLUCOSEU 150 (A) 10/06/2021 1454   HGBUR SMALL (A) 10/06/2021 1454   BILIRUBINUR NEGATIVE 10/06/2021 1454   KETONESUR 5 (A) 10/06/2021 1454   PROTEINUR 100 (A) 10/06/2021 1454   NITRITE NEGATIVE 10/06/2021 1454   LEUKOCYTESUR TRACE (A) 10/06/2021 1454   Sepsis Labs Recent Labs  Lab 11/23/23 1841 11/24/23 0815 11/25/23 0558 11/26/23 0719  WBC 11.8* 7.0 8.5 6.3   Microbiology No results found for this or any previous visit (from the past 240 hours). Imaging DG HIP UNILAT WITH PELVIS 2-3 VIEWS RIGHT Result Date: 11/24/2023 CLINICAL DATA:  ORIF of the left femoral fracture. EXAM: DG HIP (WITH OR WITHOUT PELVIS) 2-3V RIGHT COMPARISON:  Left knee radiograph dated 11/23/2023. FINDINGS: Seven intraoperative fluoroscopic spot images provided. The total fluoroscopic time is 57 seconds with a cumulative air Karma of 19.18 mGy. Status post ORIF of left femoral fracture. IMPRESSION: Intraoperative fluoroscopic spot images. Electronically Signed   By: Elgie Collard M.D.   On: 11/24/2023 15:44   DG C-Arm 1-60 Min-No  Report Result Date: 11/24/2023 Fluoroscopy was utilized by the requesting physician.  No radiographic interpretation.   DG Hip Unilat W or Wo Pelvis 2-3 Views Left Result Date: 11/23/2023 CLINICAL DATA:  Left hip pain  after fall. EXAM: DG HIP (WITH OR WITHOUT PELVIS) 2-3V LEFT COMPARISON:  None Available. FINDINGS: Severely displaced and comminuted fracture is seen involving the intertrochanteric region of the proximal left femur. IMPRESSION: Severely displaced and comminuted intertrochanteric fracture of proximal left femur. Electronically Signed   By: Lupita Raider M.D.   On: 11/23/2023 17:07   DG Chest 1 View Result Date: 11/23/2023 CLINICAL DATA:  Preop. EXAM: CHEST  1 VIEW COMPARISON:  None Available. FINDINGS: Mild cardiomegaly is noted. Both lungs are clear. The visualized skeletal structures are unremarkable. IMPRESSION: No active disease. Electronically Signed   By: Lupita Raider M.D.   On: 11/23/2023 17:06      Time coordinating discharge: over 30 minutes  SIGNED:  Sunnie Nielsen DO [facility]

## 2023-11-26 NOTE — Hospital Course (Signed)
 Hospital course / significant events:   HPI: Patient is a 78 y.o. male with medical history significant for HTN, HLD, DM, prior UTIs, prior history of DVT no longer on anticoagulation, presents w/ L hip pain following an accidental fall when he tripped over a root while working in the woods.    03/30: admitted to hospitalist service with a left fracture  03/31: Reduction and internal fixation of displaced intertrochanteric left hip fracture with Biomet Affixis TFN nail.  04/01-04/02: stable, await SNF     Consultants:  Orthopedics   Procedures/Surgeries: 11/24/23: Reduction and internal fixation of displaced intertrochanteric left hip fracture with Biomet Affixis TFN nail w/ Dr Joice Lofts       ASSESSMENT & PLAN:   Closed left hip fracture Hip x-ray showing severely displaced and comminuted intertrochanteric fracture of the proximal left femur.  Status post ORIF.  PT/OT.  Continue pain management, supportive care, bowel regimen    Hyperlipidemia Continue rosuvastatin   Hypertension Continue enalapril   Diabetes mellitus Sliding scale insulin coverage    *** based on BMI: Body mass index is 30.69 kg/m.  Underweight - under 18  overweight - 25 to 29 obese - 30 or more Class 1 obesity: BMI of 30.0 to 34 Class 2 obesity: BMI of 35.0 to 39 Class 3 obesity: BMI of 40.0 to 49 Super Morbid Obesity: BMI 50-59 Super-super Morbid Obesity: BMI 60+ Significantly low or high BMI is associated with higher medical risk.  Weight management advised as adjunct to other disease management and risk reduction treatments    DVT prophylaxis: *** IV fluids: *** continuous IV fluids  Nutrition: *** Central lines / other devices: ***  Code Status: *** ACP documentation reviewed: *** none on file in [facility]  TOC needs: *** Medical barriers to dispo: ***. Expected medical readiness for discharge ***.

## 2023-11-26 NOTE — Progress Notes (Signed)
 DISCHARGE NOTE:   Pt dc with IV removed and report called to Casimiro Needle, nurse at [facility].Pt has urinal and ice pack with him and a dc packet given to pt's wife. Pt transported via EMS. Vitals taken and within normal range.

## 2023-11-26 NOTE — Progress Notes (Signed)
 Physical Therapy Treatment Patient Details Name: Douglas Lyons MRN: [id number] DOB: Dec 26, 1945 Today's Date: 11/26/2023   History of Present Illness Pt is a 78 year old male admitted with L hip fracture, now s/p  Reduction and internal fixation of displaced intertrochanteric left hip fracture 11/24/23    PMH significant for HTN, HLD, DM, prior UTIs, prior history of DVT no longer on anticoagulation,    PT Comments  Pt received upright in recliner agreeable to PT services. Reports worsening L hip pain with mobility compared to yesterday but in good spirits. Began session with HEP based exercises to reduce L hip stiffness and edema in prep for OOB mobility. Pt with improved standing with anterior weight shift and safe hand placement with RW. Improved L foot clearance as well in swing phases of gait but limited to 20' and chair follow due to L hip pain. Deferred stairs training today due to difficulty with gait today. Pt left in recliner with all needs in reach. Updated frequency today due to changes in d/c recs for f/u PT services. Current recs remain appropriate. Pt remains a falls risk and needing assistance with level ground gait and unable to safely perform stair mobility needed for return home.    If plan is discharge home, recommend the following: A little help with walking and/or transfers;A little help with bathing/dressing/bathroom;Assistance with cooking/housework;Assist for transportation;Help with stairs or ramp for entrance   Can travel by private vehicle     Yes  Equipment Recommendations  None recommended by PT    Recommendations for Other Services       Precautions / Restrictions Precautions Precautions: Fall Recall of Precautions/Restrictions: Intact Restrictions Weight Bearing Restrictions Per Provider Order: Yes LLE Weight Bearing Per Provider Order: Weight bearing as tolerated     Mobility  Bed Mobility               General bed mobility comments: NT. In  recliner pre and post session. Patient Response: Cooperative  Transfers Overall transfer level: Needs assistance Equipment used: Rolling walker (2 wheels) Transfers: Sit to/from Stand Sit to Stand: Contact guard assist           General transfer comment: improved anterior weight shift into standing with safe hand placement.    Ambulation/Gait Ambulation/Gait assistance: Supervision Gait Distance (Feet): 20 Feet Assistive device: Rolling walker (2 wheels) Gait Pattern/deviations: Step-to pattern, Antalgic, Trunk flexed       General Gait Details: Safe use of RW and improved L foot clearance in swing phase however unable to progress distance due to L hip pain.   Stairs         General stair comments: deferring stairs today due to pain and difficulty with ambulation today.   Wheelchair Mobility     Tilt Bed Tilt Bed Patient Response: Cooperative  Modified Rankin (Stroke Patients Only)       Balance Overall balance assessment: Needs assistance Sitting-balance support: Feet supported Sitting balance-Leahy Scale: Normal     Standing balance support: Bilateral upper extremity supported, During functional activity, Reliant on assistive device for balance Standing balance-Leahy Scale: Fair Standing balance comment: reliant on AD                            Communication Communication Communication: No apparent difficulties  Cognition Arousal: Alert Behavior During Therapy: WFL for tasks assessed/performed   PT - Cognitive impairments: No apparent impairments  Following commands: Intact      Cueing Cueing Techniques: Verbal cues  Exercises General Exercises - Lower Extremity Quad Sets: AROM, Strengthening, Left, 10 reps, Supine Short Arc Quad: AROM, Strengthening, Left, 10 reps, Supine Heel Slides: AAROM, Strengthening, Left, 10 reps, Supine Hip ABduction/ADduction: AROM, Strengthening, Left, 10 reps     General Comments        Pertinent Vitals/Pain Pain Assessment Pain Assessment: Faces Faces Pain Scale: Hurts little more Pain Location: L hip with gait Pain Descriptors / Indicators: Discomfort, Sore Pain Intervention(s): Limited activity within patient's tolerance, Monitored during session, Repositioned    Home Living                          Prior Function            PT Goals (current goals can now be found in the care plan section) Acute Rehab PT Goals Patient Stated Goal: Return home PT Goal Formulation: With patient Time For Goal Achievement: 12/09/23 Potential to Achieve Goals: Fair Progress towards PT goals: Progressing toward goals    Frequency    7X/week      PT Plan      Co-evaluation              AM-PAC PT "6 Clicks" Mobility   Outcome Measure  Help needed turning from your back to your side while in a flat bed without using bedrails?: A Little Help needed moving from lying on your back to sitting on the side of a flat bed without using bedrails?: A Little Help needed moving to and from a bed to a chair (including a wheelchair)?: A Little Help needed standing up from a chair using your arms (e.g., wheelchair or bedside chair)?: A Little Help needed to walk in hospital room?: A Lot Help needed climbing 3-5 steps with a railing? : A Lot 6 Click Score: 16    End of Session Equipment Utilized During Treatment: Gait belt Activity Tolerance: Patient limited by pain Patient left: in chair;with call bell/phone within reach Nurse Communication: Mobility status PT Visit Diagnosis: Other abnormalities of gait and mobility (R26.89);Muscle weakness (generalized) (M62.81);Pain Pain - Right/Left: Left Pain - part of body: Hip     Time: 1610-9604 PT Time Calculation (min) (ACUTE ONLY): 18 min  Charges:    $Therapeutic Exercise: 8-22 mins PT General Charges $$ ACUTE PT VISIT: 1 Visit                     Delphia Grates. Fairly IV, PT,  DPT Physical Therapist- [place]  [facility]  11/26/2023, 10:28 AM

## 2023-11-26 NOTE — Plan of Care (Signed)
  Problem: Activity: Goal: Risk for activity intolerance will decrease Outcome: Progressing   Problem: Elimination: Goal: Will not experience complications related to bowel motility Outcome: Progressing   Problem: Pain Managment: Goal: General experience of comfort will improve and/or be controlled Outcome: Progressing

## 2023-11-26 NOTE — Progress Notes (Signed)
  Subjective: 2 Days Post-Op Procedure(s) (LRB): FIXATION, FRACTURE, INTERTROCHANTERIC, WITH INTRAMEDULLARY ROD (Left) Patient reports pain as moderate.   Patient is well, and has had no acute complaints or problems PT and care management to assist with discharge planning.  Patient will possibly need SNF at discharge. Negative for chest pain and shortness of breath Fever: no Gastrointestinal:Negative for nausea and vomiting Reports he is passing gas.  Objective: Vital signs in last 24 hours: Temp:  [97.8 F (36.6 C)-98.6 F (37 C)] 98.6 F (37 C) (04/02 0724) Pulse Rate:  [64-82] 77 (04/02 0724) Resp:  [15-18] 18 (04/02 0724) BP: (125-145)/(55-64) 136/59 (04/02 0724) SpO2:  [97 %-100 %] 97 % (04/02 0724)  Intake/Output from previous day:  Intake/Output Summary (Last 24 hours) at 11/26/2023 0726 Last data filed at 11/26/2023 0200 Gross per 24 hour  Intake --  Output 300 ml  Net -300 ml    Intake/Output this shift: No intake/output data recorded.  Labs: Recent Labs    11/23/23 1841 11/24/23 0815 11/25/23 0558  HGB 11.8* 10.9* 10.1*   Recent Labs    11/24/23 0815 11/25/23 0558  WBC 7.0 8.5  RBC 3.86* 3.63*  HCT 33.7* 31.0*  PLT 201 204   Recent Labs    11/24/23 0815 11/25/23 0558  NA 136 133*  K 4.4 4.1  CL 104 104  CO2 24 20*  BUN 16 19  CREATININE 0.84 1.01  GLUCOSE 195* 190*  CALCIUM 8.4* 8.2*   No results for input(s): "LABPT", "INR" in the last 72 hours.   EXAM General - Patient is Alert, Appropriate, and Oriented Extremity - ABD soft Neurovascular intact Incision: dressing C/D/I No cellulitis present Compartment soft Dressing/Incision - clean, dry, no drainage noted to the left leg honeycomb dressings. Motor Function - intact, moving foot and toes well on exam.  Abdomen soft with intact bowel sounds.  Past Medical History:  Diagnosis Date   Cataracts, bilateral    Degenerative joint disease of knee, left    Diabetes mellitus, type 2  (HCC)    Diverticulosis    Erectile dysfunction    GERD (gastroesophageal reflux disease)    Heme positive stool    Hyperlipidemia    Hypertension    Lumbar disc herniation    Overweight (BMI 25.0-29.9)     Assessment/Plan: 2 Days Post-Op Procedure(s) (LRB): FIXATION, FRACTURE, INTERTROCHANTERIC, WITH INTRAMEDULLARY ROD (Left) Principal Problem:   Closed left hip fracture, initial encounter (HCC) Active Problems:   Diabetes mellitus, type 2 (HCC)   Hypertension   Hyperlipidemia   History of DVT (deep vein thrombosis)   Accidental fall   Closed left hip fracture (HCC)  Estimated body mass index is 30.69 kg/m as calculated from the following:   Height as of this encounter: 5\' 11"  (1.803 m).   Weight as of this encounter: 99.8 kg. Advance diet Up with therapy D/C IV fluids when tolerating po intake.  Vitals reviewed, labs pending. Up with therapy, possible d/c tomorrow pending how he performs with PT. Current plan is for possible d/c to SNF. Continue to work on a BM.  DVT Prophylaxis - Lovenox and TED hose Weight-Bearing as tolerated to left leg  J. Horris Latino, PA-C [facility] 11/26/2023, 7:26 AM

## 2023-12-10 ENCOUNTER — Other Ambulatory Visit: Payer: Self-pay | Admitting: Surgery

## 2023-12-10 ENCOUNTER — Ambulatory Visit
Admission: RE | Admit: 2023-12-10 | Discharge: 2023-12-10 | Disposition: A | Discharge status: Home / Self Care | Source: Ambulatory Visit | Attending: Surgery | Admitting: Surgery

## 2023-12-10 DIAGNOSIS — M7989 Other specified soft tissue disorders: Secondary | ICD-10-CM | POA: Insufficient documentation

## 2024-04-28 ENCOUNTER — Other Ambulatory Visit: Payer: Self-pay | Admitting: Surgery

## 2024-05-04 ENCOUNTER — Other Ambulatory Visit: Payer: Self-pay

## 2024-05-04 ENCOUNTER — Encounter
Admission: RE | Admit: 2024-05-04 | Discharge: 2024-05-04 | Disposition: A | Discharge status: Home / Self Care | Source: Ambulatory Visit | Attending: Surgery | Admitting: Surgery

## 2024-05-04 VITALS — BP 146/85 | HR 71 | Temp 97.7°F | Resp 18 | Ht 71.0 in | Wt 205.0 lb

## 2024-05-04 DIAGNOSIS — Z01812 Encounter for preprocedural laboratory examination: Secondary | ICD-10-CM

## 2024-05-04 DIAGNOSIS — Z01818 Encounter for other preprocedural examination: Secondary | ICD-10-CM | POA: Diagnosis present

## 2024-05-04 DIAGNOSIS — E1165 Type 2 diabetes mellitus with hyperglycemia: Secondary | ICD-10-CM | POA: Diagnosis not present

## 2024-05-04 HISTORY — DX: Fracture of unspecified part of neck of left femur, subsequent encounter for closed fracture with routine healing: S72.002D

## 2024-05-04 HISTORY — DX: Other specified disorders of bone density and structure, left thigh: M85.852

## 2024-05-04 HISTORY — DX: Essential (primary) hypertension: I10

## 2024-05-04 HISTORY — DX: Acute embolism and thrombosis of unspecified deep veins of unspecified lower extremity: I82.409

## 2024-05-04 LAB — COMPREHENSIVE METABOLIC PANEL WITH GFR
ALT: 11 U/L (ref 0–44)
AST: 10 U/L — ABNORMAL LOW (ref 15–41)
Albumin: 3.9 g/dL (ref 3.5–5.0)
Alkaline Phosphatase: 79 U/L (ref 38–126)
Anion gap: 9 (ref 5–15)
BUN: 16 mg/dL (ref 8–23)
CO2: 25 mmol/L (ref 22–32)
Calcium: 9.2 mg/dL (ref 8.9–10.3)
Chloride: 103 mmol/L (ref 98–111)
Creatinine, Ser: 0.95 mg/dL (ref 0.61–1.24)
GFR, Estimated: >60 mL/min (ref >60)
Glucose, Bld: 178 mg/dL — ABNORMAL HIGH (ref 70–99)
Potassium: 4.9 mmol/L (ref 3.5–5.1)
Sodium: 137 mmol/L (ref 135–145)
Total Bilirubin: 0.7 mg/dL (ref 0.0–1.2)
Total Protein: 7 g/dL (ref 6.5–8.1)

## 2024-05-04 LAB — CBC WITH DIFFERENTIAL/PLATELET
Abs Immature Granulocytes: 0.03 K/uL (ref 0.00–0.07)
Basophils Absolute: 0 K/uL (ref 0.0–0.1)
Basophils Relative: 1 %
Eosinophils Absolute: 0.1 K/uL (ref 0.0–0.5)
Eosinophils Relative: 1 %
HCT: 45.3 % (ref 39.0–52.0)
Hemoglobin: 15.2 g/dL (ref 13.0–17.0)
Immature Granulocytes: 1 %
Lymphocytes Relative: 20 %
Lymphs Abs: 1.1 K/uL (ref 0.7–4.0)
MCH: 30.2 pg (ref 26.0–34.0)
MCHC: 33.6 g/dL (ref 30.0–36.0)
MCV: 90.1 fL (ref 80.0–100.0)
Monocytes Absolute: 0.5 K/uL (ref 0.1–1.0)
Monocytes Relative: 10 %
Neutro Abs: 3.6 K/uL (ref 1.7–7.7)
Neutrophils Relative %: 67 %
Platelets: 225 K/uL (ref 150–400)
RBC: 5.03 MIL/uL (ref 4.22–5.81)
RDW: 13.5 % (ref 11.5–15.5)
WBC: 5.3 K/uL (ref 4.0–10.5)
nRBC: 0 % (ref 0.0–0.2)

## 2024-05-04 LAB — URINALYSIS, ROUTINE W REFLEX MICROSCOPIC
Bilirubin Urine: NEGATIVE
Glucose, UA: 50 mg/dL — AB
Hgb urine dipstick: NEGATIVE
Ketones, ur: NEGATIVE mg/dL
Leukocytes,Ua: NEGATIVE
Nitrite: NEGATIVE
Protein, ur: NEGATIVE mg/dL
Specific Gravity, Urine: 1.02 (ref 1.005–1.030)
pH: 5 (ref 5.0–8.0)

## 2024-05-04 LAB — SURGICAL PCR SCREEN
MRSA, PCR: NEGATIVE
Staphylococcus aureus: NEGATIVE

## 2024-05-04 NOTE — Patient Instructions (Addendum)
 Your procedure is scheduled on: Tuesday 05/11/24 Report to the Registration Desk on the 1st floor of the [facility]. To find out your arrival time, please call [phone] between 1PM - 3PM on: Monday 05/10/24 If your arrival time is 6:00 am, do not arrive before that time as the [facility] entrance doors do not open until 6:00 am.  REMEMBER: Instructions that are not followed completely may result in serious medical risk, up to and including death; or upon the discretion of your surgeon and anesthesiologist your surgery may need to be rescheduled.  Do not eat food after midnight the night before surgery.  No gum chewing or hard candies.  You may however, drink CLEAR liquids up to 2 hours before you are scheduled to arrive for your surgery. Do not drink anything within 2 hours of your scheduled arrival time.  Clear liquids include: - water  Do NOT drink anything that is not on this list.  **Type 1 and Type 2 diabetics should only drink water.**  In addition, your doctor has ordered for you to drink the provided:  Gatorade G2 Drinking this carbohydrate drink up to two hours before surgery helps to reduce insulin  resistance and improve patient outcomes. Please complete drinking 2 hours before scheduled arrival time.  One week prior to surgery: Stop Anti-inflammatories (NSAIDS) such as Advil, Aleve, Ibuprofen, Motrin, Naproxen, Naprosyn and Aspirin  based products such as Excedrin, Goody's Powder, BC Powder. Stop ANY OVER THE COUNTER supplements until after surgery. calcium  carbonate (CALCIUM  600) 600 MG  cholecalciferol  (VITAMIN D3) 25 MCG (1000 UNIT)  Omega-3 Fatty Acids (OMEGA 3)   You may however, continue to take Tylenol  if needed for pain up until the day of surgery.  Stop glipiZIDE-metformin  (METAGLIP) 2.5-500 MG 2 days prior to surgery (take last dose Saturday 05/08/24) Continue taking all of your other prescription medications up until the day of surgery.  ON THE DAY OF  SURGERY ONLY TAKE THESE MEDICATIONS WITH SIPS OF WATER:  None    No Alcohol for 24 hours before or after surgery.  No Smoking including e-cigarettes for 24 hours before surgery.  No chewable tobacco products for at least 6 hours before surgery.  No nicotine patches on the day of surgery.  Do not use any recreational drugs for at least a week (preferably 2 weeks) before your surgery.  Please be advised that the combination of cocaine and anesthesia may have negative outcomes, up to and including death. If you test positive for cocaine, your surgery will be cancelled.  On the morning of surgery brush your teeth with toothpaste and water, you may rinse your mouth with mouthwash if you wish. Do not swallow any toothpaste or mouthwash.  Use CHG Soap or wipes as directed on instruction sheet.  Do not wear jewelry, make-up, hairpins, clips or nail polish.  For welded (permanent) jewelry: bracelets, anklets, waist bands, etc.  Please have this removed prior to surgery.  If it is not removed, there is a chance that hospital personnel will need to cut it off on the day of surgery.  Do not wear lotions, powders, or perfumes.   Do not shave body hair from the neck down 48 hours before surgery.  Contact lenses, hearing aids and dentures may not be worn into surgery.  Do not bring valuables to the hospital. [facility] is not responsible for any missing/lost belongings or valuables.   Notify your doctor if there is any change in your medical condition (cold, fever, infection).  Wear comfortable clothing (specific to your surgery type) to the hospital.  After surgery, you can help prevent lung complications by doing breathing exercises.  Take deep breaths and cough every 1-2 hours. Your doctor may order a device called an Incentive Spirometer to help you take deep breaths.  If you are being admitted to the hospital overnight, leave your suitcase in the car. After surgery it may be brought  to your room.  In case of increased patient census, it may be necessary for you, the patient, to continue your postoperative care in the Same Day Surgery department.  If you are being discharged the day of surgery, you will not be allowed to drive home. You will need a responsible individual to drive you home and stay with you for 24 hours after surgery.   If you are taking public transportation, you will need to have a responsible individual with you.  Please call the Pre-admissions Testing Dept. at [phone] if you have any questions about these instructions.  Surgery Visitation Policy:  Patients having surgery or a procedure may have two visitors.  Children under the age of 37 must have an adult with them who is not the patient.  Inpatient Visitation:    Visiting hours are 7 a.m. to 8 p.m. Up to four visitors are allowed at one time in a patient room. The visitors may rotate out with other people during the day.  One visitor age 57 or older may stay with the patient overnight and must be in the room by 8 p.m.   Merchandiser, retail to address health-related social needs:  [email or web address]     Pre-operative 5 CHG Bath Instructions   You can play a key role in reducing the risk of infection after surgery. Your skin needs to be as free of germs as possible. You can reduce the number of germs on your skin by washing with CHG (chlorhexidine  gluconate) soap before surgery. CHG is an antiseptic soap that kills germs and continues to kill germs even after washing.   DO NOT use if you have an allergy to chlorhexidine /CHG or antibacterial soaps. If your skin becomes reddened or irritated, stop using the CHG and notify one of our RNs at [phone].   Please shower with the CHG soap starting 4 days before surgery using the following schedule:     Please keep in mind the following:  DO NOT shave, including legs and underarms, starting the day of your first  shower.   You may shave your face at any point before/day of surgery.  Place clean sheets on your bed the day you start using CHG soap. Use a clean washcloth (not used since being washed) for each shower. DO NOT sleep with pets once you start using the CHG.   CHG Shower Instructions:  If you choose to wash your hair and private area, wash first with your normal shampoo/soap.  After you use shampoo/soap, rinse your hair and body thoroughly to remove shampoo/soap residue.  Turn the water OFF and apply about 3 tablespoons (45 ml) of CHG soap to a CLEAN washcloth.  Apply CHG soap ONLY FROM YOUR NECK DOWN TO YOUR TOES (washing for 3-5 minutes)  DO NOT use CHG soap on face, private areas, open wounds, or sores.  Pay special attention to the area where your surgery is being performed.  If you are having back surgery, having someone wash your back for you may be helpful. Wait 2 minutes after CHG soap is  applied, then you may rinse off the CHG soap.  Pat dry with a clean towel  Put on clean clothes/pajamas   If you choose to wear lotion, please use ONLY the CHG-compatible lotions on the back of this paper.     Additional instructions for the day of surgery: DO NOT APPLY any lotions, deodorants, cologne, or perfumes.   Put on clean/comfortable clothes.  Brush your teeth.  Ask your nurse before applying any prescription medications to the skin.      CHG Compatible Lotions   Aveeno Moisturizing lotion  Cetaphil Moisturizing Cream  Cetaphil Moisturizing Lotion  Clairol Herbal Essence Moisturizing Lotion, Dry Skin  Clairol Herbal Essence Moisturizing Lotion, Extra Dry Skin  Clairol Herbal Essence Moisturizing Lotion, Normal Skin  Curel Age Defying Therapeutic Moisturizing Lotion with Alpha Hydroxy  Curel Extreme Care Body Lotion  Curel Soothing Hands Moisturizing Hand Lotion  Curel Therapeutic Moisturizing Cream, Fragrance-Free  Curel Therapeutic Moisturizing Lotion, Fragrance-Free  Curel  Therapeutic Moisturizing Lotion, Original Formula  Eucerin Daily Replenishing Lotion  Eucerin Dry Skin Therapy Plus Alpha Hydroxy Crme  Eucerin Dry Skin Therapy Plus Alpha Hydroxy Lotion  Eucerin Original Crme  Eucerin Original Lotion  Eucerin Plus Crme Eucerin Plus Lotion  Eucerin TriLipid Replenishing Lotion  Keri Anti-Bacterial Hand Lotion  Keri Deep Conditioning Original Lotion Dry Skin Formula Softly Scented  Keri Deep Conditioning Original Lotion, Fragrance Free Sensitive Skin Formula  Keri Lotion Fast Absorbing Fragrance Free Sensitive Skin Formula  Keri Lotion Fast Absorbing Softly Scented Dry Skin Formula  Keri Original Lotion  Keri Skin Renewal Lotion Keri Silky Smooth Lotion  Keri Silky Smooth Sensitive Skin Lotion  Nivea Body Creamy Conditioning Oil  Nivea Body Extra Enriched Lotion  Nivea Body Original Lotion  Nivea Body Sheer Moisturizing Lotion Nivea Crme  Nivea Skin Firming Lotion  NutraDerm 30 Skin Lotion  NutraDerm Skin Lotion  NutraDerm Therapeutic Skin Cream  NutraDerm Therapeutic Skin Lotion  ProShield Protective Hand Cream  Provon moisturizing lotion  How to Use an Incentive Spirometer  An incentive spirometer is a tool that measures how well you are filling your lungs with each breath. Learning to take long, deep breaths using this tool can help you keep your lungs clear and active. This may help to reverse or lessen your chance of developing breathing (pulmonary) problems, especially infection. You may be asked to use a spirometer: After a surgery. If you have a lung problem or a history of smoking. After a long period of time when you have been unable to move or be active. If the spirometer includes an indicator to show the highest number that you have reached, your health care provider or respiratory therapist will help you set a goal. Keep a log of your progress as told by your health care provider. What are the risks? Breathing too quickly may cause  dizziness or cause you to pass out. Take your time so you do not get dizzy or light-headed. If you are in pain, you may need to take pain medicine before doing incentive spirometry. It is harder to take a deep breath if you are having pain. How to use your incentive spirometer  Sit up on the edge of your bed or on a chair. Hold the incentive spirometer so that it is in an upright position. Before you use the spirometer, breathe out normally. Place the mouthpiece in your mouth. Make sure your lips are closed tightly around it. Breathe in slowly and as deeply as you can through  your mouth, causing the piston or the ball to rise toward the top of the chamber. Hold your breath for 3-5 seconds, or for as long as possible. If the spirometer includes a coach indicator, use this to guide you in breathing. Slow down your breathing if the indicator goes above the marked areas. Remove the mouthpiece from your mouth and breathe out normally. The piston or ball will return to the bottom of the chamber. Rest for a few seconds, then repeat the steps 10 or more times. Take your time and take a few normal breaths between deep breaths so that you do not get dizzy or light-headed. Do this every 1-2 hours when you are awake. If the spirometer includes a goal marker to show the highest number you have reached (best effort), use this as a goal to work toward during each repetition. After each set of 10 deep breaths, cough a few times. This will help to make sure that your lungs are clear. If you have an incision on your chest or abdomen from surgery, place a pillow or a rolled-up towel firmly against the incision when you cough. This can help to reduce pain while taking deep breaths and coughing. General tips When you are able to get out of bed: Walk around often. Continue to take deep breaths and cough in order to clear your lungs. Keep using the incentive spirometer until your health care provider says it is okay  to stop using it. If you have been in the hospital, you may be told to keep using the spirometer at home. Contact a health care provider if: You are having difficulty using the spirometer. You have trouble using the spirometer as often as instructed. Your pain medicine is not giving enough relief for you to use the spirometer as told. You have a fever. Get help right away if: You develop shortness of breath. You develop a cough with bloody mucus from the lungs. You have fluid or blood coming from an incision site after you cough. Summary An incentive spirometer is a tool that can help you learn to take long, deep breaths to keep your lungs clear and active. You may be asked to use a spirometer after a surgery, if you have a lung problem or a history of smoking, or if you have been inactive for a long period of time. Use your incentive spirometer as instructed every 1-2 hours while you are awake. If you have an incision on your chest or abdomen, place a pillow or a rolled-up towel firmly against your incision when you cough. This will help to reduce pain. Get help right away if you have shortness of breath, you cough up bloody mucus, or blood comes from your incision when you cough. This information is not intended to replace advice given to you by your health care provider. Make sure you discuss any questions you have with your health care provider.    Please go to the following website to access important education materials concerning your upcoming joint replacement.                                   [email or web address]

## 2024-05-05 LAB — HEMOGLOBIN A1C
Hgb A1c MFr Bld: 6.9 % — ABNORMAL HIGH (ref 4.8–5.6)
Mean Plasma Glucose: 151.33 mg/dL

## 2024-05-11 ENCOUNTER — Ambulatory Visit: Payer: Self-pay | Admitting: Urgent Care

## 2024-05-11 ENCOUNTER — Other Ambulatory Visit: Payer: Self-pay

## 2024-05-11 ENCOUNTER — Ambulatory Visit
Admission: RE | Admit: 2024-05-11 | Discharge: 2024-05-11 | Disposition: A | Discharge status: Home / Self Care | Attending: Surgery | Admitting: Surgery

## 2024-05-11 ENCOUNTER — Encounter
Admission: RE | Disposition: A | Discharge status: Home / Self Care | Payer: Self-pay | Source: Home / Self Care | Attending: Surgery

## 2024-05-11 ENCOUNTER — Ambulatory Visit

## 2024-05-11 ENCOUNTER — Encounter: Payer: Self-pay | Admitting: Surgery

## 2024-05-11 DIAGNOSIS — Z01812 Encounter for preprocedural laboratory examination: Secondary | ICD-10-CM

## 2024-05-11 DIAGNOSIS — M1711 Unilateral primary osteoarthritis, right knee: Secondary | ICD-10-CM | POA: Insufficient documentation

## 2024-05-11 DIAGNOSIS — E663 Overweight: Secondary | ICD-10-CM | POA: Diagnosis not present

## 2024-05-11 DIAGNOSIS — I10 Essential (primary) hypertension: Secondary | ICD-10-CM | POA: Diagnosis not present

## 2024-05-11 DIAGNOSIS — K219 Gastro-esophageal reflux disease without esophagitis: Secondary | ICD-10-CM | POA: Insufficient documentation

## 2024-05-11 DIAGNOSIS — Z86718 Personal history of other venous thrombosis and embolism: Secondary | ICD-10-CM | POA: Insufficient documentation

## 2024-05-11 DIAGNOSIS — Z6828 Body mass index (BMI) 28.0-28.9, adult: Secondary | ICD-10-CM | POA: Insufficient documentation

## 2024-05-11 DIAGNOSIS — E1165 Type 2 diabetes mellitus with hyperglycemia: Secondary | ICD-10-CM

## 2024-05-11 DIAGNOSIS — E119 Type 2 diabetes mellitus without complications: Secondary | ICD-10-CM | POA: Insufficient documentation

## 2024-05-11 DIAGNOSIS — E785 Hyperlipidemia, unspecified: Secondary | ICD-10-CM | POA: Diagnosis not present

## 2024-05-11 DIAGNOSIS — Z7901 Long term (current) use of anticoagulants: Secondary | ICD-10-CM | POA: Diagnosis not present

## 2024-05-11 DIAGNOSIS — M179 Osteoarthritis of knee, unspecified: Secondary | ICD-10-CM

## 2024-05-11 HISTORY — PX: TOTAL KNEE ARTHROPLASTY: SHX125

## 2024-05-11 LAB — GLUCOSE, CAPILLARY
Glucose-Capillary: 282 mg/dL — ABNORMAL HIGH (ref 70–99)
Glucose-Capillary: 240 mg/dL — ABNORMAL HIGH (ref 70–99)
Glucose-Capillary: 228 mg/dL — ABNORMAL HIGH (ref 70–99)

## 2024-05-11 SURGERY — ARTHROPLASTY, KNEE, TOTAL
Anesthesia: Spinal | Site: Knee | Laterality: Right

## 2024-05-11 MED ORDER — CEFAZOLIN SODIUM-DEXTROSE 2-4 GM/100ML-% IV SOLN
INTRAVENOUS | Status: AC
  Filled 2024-05-11: qty 100

## 2024-05-11 MED ORDER — OXYCODONE HCL 5 MG PO TABS: 5.0000 mg | ORAL_TABLET | ORAL | 0 refills | Status: DC | PRN

## 2024-05-11 MED ORDER — METOCLOPRAMIDE HCL 10 MG PO TABS: 5.0000 mg | ORAL_TABLET | Freq: Three times a day (TID) | ORAL | Status: DC | PRN

## 2024-05-11 MED ORDER — TRANEXAMIC ACID-NACL 1000-0.7 MG/100ML-% IV SOLN
INTRAVENOUS | Status: AC
  Filled 2024-05-11: qty 100

## 2024-05-11 MED ORDER — SODIUM CHLORIDE 0.9 % IV SOLN
INTRAVENOUS | Status: DC | PRN
  Administered 2024-05-11: 60 mL

## 2024-05-11 MED ORDER — TRIAMCINOLONE ACETONIDE 40 MG/ML IJ SUSP
INTRAMUSCULAR | Status: AC
  Filled 2024-05-11: qty 2

## 2024-05-11 MED ORDER — ACETAMINOPHEN 10 MG/ML IV SOLN
INTRAVENOUS | Status: DC | PRN
  Administered 2024-05-11: 1000 mg via INTRAVENOUS

## 2024-05-11 MED ORDER — DEXAMETHASONE SODIUM PHOSPHATE 10 MG/ML IJ SOLN
INTRAMUSCULAR | Status: AC
  Filled 2024-05-11: qty 1

## 2024-05-11 MED ORDER — BUPIVACAINE HCL (PF) 0.5 % IJ SOLN
INTRAMUSCULAR | Status: DC | PRN
  Administered 2024-05-11: 3 mL

## 2024-05-11 MED ORDER — PROPOFOL 500 MG/50ML IV EMUL
INTRAVENOUS | Status: DC | PRN
  Administered 2024-05-11: 80 ug/kg/min via INTRAVENOUS

## 2024-05-11 MED ORDER — LIDOCAINE HCL (CARDIAC) PF 100 MG/5ML IV SOSY
PREFILLED_SYRINGE | INTRAVENOUS | Status: DC | PRN
  Administered 2024-05-11: 2 mL via INTRAVENOUS
  Administered 2024-05-11: 40 mg via INTRAVENOUS

## 2024-05-11 MED ORDER — KETOROLAC TROMETHAMINE 30 MG/ML IJ SOLN
INTRAMUSCULAR | Status: DC | PRN
  Administered 2024-05-11: 30 mg via INTRAMUSCULAR

## 2024-05-11 MED ORDER — PHENYLEPHRINE HCL-NACL 20-0.9 MG/250ML-% IV SOLN
INTRAVENOUS | Status: AC
  Filled 2024-05-11: qty 250

## 2024-05-11 MED ORDER — ORAL CARE MOUTH RINSE: 15.0000 mL | Freq: Once | OROMUCOSAL | Status: AC

## 2024-05-11 MED ORDER — PROPOFOL 10 MG/ML IV BOLUS
INTRAVENOUS | Status: AC
  Filled 2024-05-11: qty 20

## 2024-05-11 MED ORDER — CHLORHEXIDINE GLUCONATE 0.12 % MT SOLN
15.0000 mL | Freq: Once | OROMUCOSAL | Status: AC
  Administered 2024-05-11: 15 mL via OROMUCOSAL

## 2024-05-11 MED ORDER — ONDANSETRON HCL 4 MG/2ML IJ SOLN
INTRAMUSCULAR | Status: DC | PRN
  Administered 2024-05-11: 4 mg via INTRAVENOUS

## 2024-05-11 MED ORDER — CEFAZOLIN SODIUM-DEXTROSE 2-4 GM/100ML-% IV SOLN
2.0000 g | Freq: Four times a day (QID) | INTRAVENOUS | Status: DC
  Administered 2024-05-11: 2 g via INTRAVENOUS

## 2024-05-11 MED ORDER — SEVOFLURANE IN SOLN
RESPIRATORY_TRACT | Status: AC
  Filled 2024-05-11: qty 250

## 2024-05-11 MED ORDER — INSULIN ASPART 100 UNIT/ML IJ SOLN
8.0000 [IU] | Freq: Once | INTRAMUSCULAR | Status: AC
  Administered 2024-05-11: 8 [IU] via SUBCUTANEOUS

## 2024-05-11 MED ORDER — FENTANYL CITRATE (PF) 100 MCG/2ML IJ SOLN
INTRAMUSCULAR | Status: AC
  Filled 2024-05-11: qty 2

## 2024-05-11 MED ORDER — PROPOFOL 10 MG/ML IV BOLUS
INTRAVENOUS | Status: DC | PRN
  Administered 2024-05-11: 30 mg via INTRAVENOUS
  Administered 2024-05-11: 10 mg via INTRAVENOUS
  Administered 2024-05-11 (×2): 20 mg via INTRAVENOUS

## 2024-05-11 MED ORDER — BUPIVACAINE-EPINEPHRINE (PF) 0.5% -1:200000 IJ SOLN
INTRAMUSCULAR | Status: DC | PRN
  Administered 2024-05-11: 30 mL

## 2024-05-11 MED ORDER — BUPIVACAINE LIPOSOME 1.3 % IJ SUSP
INTRAMUSCULAR | Status: AC
  Filled 2024-05-11: qty 20

## 2024-05-11 MED ORDER — ACETAMINOPHEN 325 MG PO TABS: 325.0000 mg | ORAL_TABLET | Freq: Four times a day (QID) | ORAL | Status: DC | PRN

## 2024-05-11 MED ORDER — FENTANYL CITRATE (PF) 100 MCG/2ML IJ SOLN
INTRAMUSCULAR | Status: DC | PRN
  Administered 2024-05-11 (×4): 25 ug via INTRAVENOUS

## 2024-05-11 MED ORDER — SODIUM CHLORIDE 0.9 % BOLUS PEDS: 250.0000 mL | Freq: Once | INTRAVENOUS | Status: DC

## 2024-05-11 MED ORDER — INSULIN ASPART 100 UNIT/ML IJ SOLN
INTRAMUSCULAR | Status: AC
  Filled 2024-05-11: qty 1

## 2024-05-11 MED ORDER — PHENYLEPHRINE HCL-NACL 20-0.9 MG/250ML-% IV SOLN
INTRAVENOUS | Status: DC | PRN
  Administered 2024-05-11: 30 ug/min via INTRAVENOUS

## 2024-05-11 MED ORDER — ONDANSETRON HCL 4 MG PO TABS: 4.0000 mg | ORAL_TABLET | Freq: Four times a day (QID) | ORAL | Status: DC | PRN

## 2024-05-11 MED ORDER — ONDANSETRON HCL 4 MG/2ML IJ SOLN
INTRAMUSCULAR | Status: AC
  Filled 2024-05-11: qty 2

## 2024-05-11 MED ORDER — DEXAMETHASONE SODIUM PHOSPHATE 4 MG/ML IJ SOLN
INTRAMUSCULAR | Status: DC | PRN
  Administered 2024-05-11: 5 mg via INTRAVENOUS

## 2024-05-11 MED ORDER — TRANEXAMIC ACID-NACL 1000-0.7 MG/100ML-% IV SOLN
1000.0000 mg | INTRAVENOUS | Status: AC
Start: 2024-05-11 — End: 2024-05-11
  Administered 2024-05-11: 1000 mg via INTRAVENOUS

## 2024-05-11 MED ORDER — BUPIVACAINE-EPINEPHRINE (PF) 0.5% -1:200000 IJ SOLN
INTRAMUSCULAR | Status: AC
  Filled 2024-05-11: qty 30

## 2024-05-11 MED ORDER — CEFAZOLIN SODIUM-DEXTROSE 2-4 GM/100ML-% IV SOLN
2.0000 g | INTRAVENOUS | Status: AC
  Administered 2024-05-11: 2 g via INTRAVENOUS

## 2024-05-11 MED ORDER — ACETAMINOPHEN 10 MG/ML IV SOLN
INTRAVENOUS | Status: AC
  Filled 2024-05-11: qty 100

## 2024-05-11 MED ORDER — SODIUM CHLORIDE (PF) 0.9 % IJ SOLN
INTRAMUSCULAR | Status: AC
  Filled 2024-05-11: qty 40

## 2024-05-11 MED ORDER — ONDANSETRON HCL 4 MG/2ML IJ SOLN: 4.0000 mg | Freq: Four times a day (QID) | INTRAMUSCULAR | Status: DC | PRN

## 2024-05-11 MED ORDER — SODIUM CHLORIDE 0.9 % BOLUS PEDS
250.0000 mL | Freq: Once | INTRAVENOUS | Status: AC
  Administered 2024-05-11: 250 mL via INTRAVENOUS

## 2024-05-11 MED ORDER — FENTANYL CITRATE (PF) 100 MCG/2ML IJ SOLN: 25.0000 ug | INTRAMUSCULAR | Status: DC | PRN

## 2024-05-11 MED ORDER — CHLORHEXIDINE GLUCONATE 0.12 % MT SOLN
OROMUCOSAL | Status: AC
Start: 2024-05-11 — End: 2024-05-11
  Filled 2024-05-11: qty 15

## 2024-05-11 MED ORDER — OXYCODONE HCL 5 MG PO TABS: 5.0000 mg | ORAL_TABLET | ORAL | Status: DC | PRN

## 2024-05-11 MED ORDER — SODIUM CHLORIDE 0.9 % IV SOLN: INTRAVENOUS | Status: DC

## 2024-05-11 MED ORDER — OXYCODONE HCL 5 MG PO TABS: 5.0000 mg | ORAL_TABLET | Freq: Once | ORAL | Status: DC | PRN

## 2024-05-11 MED ORDER — TRIAMCINOLONE ACETONIDE 40 MG/ML IJ SUSP
INTRAMUSCULAR | Status: DC | PRN
  Administered 2024-05-11: 80 mg via INTRAMUSCULAR

## 2024-05-11 MED ORDER — KETOROLAC TROMETHAMINE 15 MG/ML IJ SOLN
15.0000 mg | Freq: Once | INTRAMUSCULAR | Status: AC
  Administered 2024-05-11: 15 mg via INTRAVENOUS

## 2024-05-11 MED ORDER — OXYCODONE HCL 5 MG/5ML PO SOLN: 5.0000 mg | Freq: Once | ORAL | Status: DC | PRN

## 2024-05-11 MED ORDER — INSULIN ASPART 100 UNIT/ML IJ SOLN
5.0000 [IU] | Freq: Once | INTRAMUSCULAR | Status: AC
  Administered 2024-05-11: 5 [IU] via SUBCUTANEOUS

## 2024-05-11 MED ORDER — METOCLOPRAMIDE HCL 5 MG/ML IJ SOLN: 5.0000 mg | Freq: Three times a day (TID) | INTRAMUSCULAR | Status: DC | PRN

## 2024-05-11 MED ORDER — APIXABAN 5 MG PO TABS: 5.0000 mg | ORAL_TABLET | Freq: Two times a day (BID) | ORAL | 0 refills | Status: DC

## 2024-05-11 MED ORDER — KETOROLAC TROMETHAMINE 15 MG/ML IJ SOLN
INTRAMUSCULAR | Status: AC
  Filled 2024-05-11: qty 1

## 2024-05-11 SURGICAL SUPPLY — 53 items
BLADE SAW 90X13X1.19 OSCILLAT (BLADE) ×1 IMPLANT
BLADE SAW SAG 25X90X1.19 (BLADE) ×1 IMPLANT
BLADE SURG SZ20 CARB STEEL (BLADE) ×1 IMPLANT
BNDG COMPR 6X5.8 VLCR NS LF (GAUZE/BANDAGES/DRESSINGS) ×1 IMPLANT
CEMENT VACUUM MIXING SYSTEM (MISCELLANEOUS) ×1 IMPLANT
CHLORAPREP W/TINT 26 (MISCELLANEOUS) ×1 IMPLANT
COMPONENT FEM CMT PS STD 11 RT (Joint) IMPLANT
COMPONENT PATELLA 3 PEG 35 (Joint) IMPLANT
COMPONENT TIB KNEE PS G 0 RT (Joint) IMPLANT
COOLER ICEMAN CLASSIC (MISCELLANEOUS) ×1 IMPLANT
COVER MAYO STAND STRL (DRAPES) ×1 IMPLANT
CUFF TRNQT CYL 24X4X16.5-23 (TOURNIQUET CUFF) IMPLANT
CUFF TRNQT CYL 34X4.125X (TOURNIQUET CUFF) IMPLANT
DRAPE IMP U-DRAPE 54X76 (DRAPES) ×1 IMPLANT
DRAPE SHEET LG 3/4 BI-LAMINATE (DRAPES) ×1 IMPLANT
DRAPE U-SHAPE 47X51 STRL (DRAPES) ×1 IMPLANT
DRSG MEPILEX SACRM 8.7X9.8 (GAUZE/BANDAGES/DRESSINGS) IMPLANT
DRSG OPSITE POSTOP 4X10 (GAUZE/BANDAGES/DRESSINGS) ×1 IMPLANT
DRSG OPSITE POSTOP 4X8 (GAUZE/BANDAGES/DRESSINGS) ×1 IMPLANT
ELECT CAUTERY BLADE 6.4 (BLADE) ×1 IMPLANT
ELECTRODE REM PT RTRN 9FT ADLT (ELECTROSURGICAL) ×1 IMPLANT
GAUZE XEROFORM 1X8 LF (GAUZE/BANDAGES/DRESSINGS) ×1 IMPLANT
GLOVE BIO SURGEON STRL SZ7.5 (GLOVE) ×4 IMPLANT
GLOVE BIO SURGEON STRL SZ8 (GLOVE) ×4 IMPLANT
GLOVE BIOGEL PI IND STRL 8 (GLOVE) ×1 IMPLANT
GLOVE INDICATOR 8.0 STRL GRN (GLOVE) ×1 IMPLANT
GLOVE SURG SYN 7.5 PF PI (GLOVE) IMPLANT
GOWN STRL REUS W/ TWL LRG LVL3 (GOWN DISPOSABLE) ×1 IMPLANT
GOWN STRL REUS W/ TWL XL LVL3 (GOWN DISPOSABLE) ×1 IMPLANT
HOOD PEEL AWAY T7 (MISCELLANEOUS) ×3 IMPLANT
INSERT TIBIAL PERSONA 10 RT (Insert) IMPLANT
KIT TURNOVER KIT A (KITS) ×1 IMPLANT
MANIFOLD NEPTUNE II (INSTRUMENTS) ×1 IMPLANT
NDL SPNL 20GX3.5 QUINCKE YW (NEEDLE) ×1 IMPLANT
NEEDLE SPNL 20GX3.5 QUINCKE YW (NEEDLE) ×1 IMPLANT
NS IRRIG 500ML POUR BTL (IV SOLUTION) ×1 IMPLANT
PACK TOTAL KNEE (MISCELLANEOUS) ×1 IMPLANT
PAD COLD UNI WRAP-ON (PAD) ×1 IMPLANT
PENCIL SMOKE EVACUATOR (MISCELLANEOUS) ×1 IMPLANT
PIN DRILL HDLS TROCAR 75 4PK (PIN) IMPLANT
SCREW FEMALE HEX FIX 25X2.5 (ORTHOPEDIC DISPOSABLE SUPPLIES) IMPLANT
SOL .9 NS 3000ML IRR UROMATIC (IV SOLUTION) ×1 IMPLANT
STAPLER SKIN PROX 35W (STAPLE) ×1 IMPLANT
STOCKINETTE IMPERV 14X48 (MISCELLANEOUS) ×1 IMPLANT
SUCTION TUBE FRAZIER 10FR DISP (SUCTIONS) ×1 IMPLANT
SUT VIC AB 0 CT1 36 (SUTURE) ×3 IMPLANT
SUT VIC AB 2-0 CT1 TAPERPNT 27 (SUTURE) ×3 IMPLANT
SYR 10ML LL (SYRINGE) ×1 IMPLANT
SYR 20ML LL LF (SYRINGE) ×1 IMPLANT
SYR 30ML LL (SYRINGE) IMPLANT
TIP FAN IRRIG PULSAVAC PLUS (DISPOSABLE) ×1 IMPLANT
TRAP FLUID SMOKE EVACUATOR (MISCELLANEOUS) ×1 IMPLANT
WATER STERILE IRR 500ML POUR (IV SOLUTION) ×1 IMPLANT

## 2024-05-11 NOTE — H&P (Signed)
 History of Present Illness: Douglas Lyons is a 78 y.o. male who presents today for his surgical history and physical for upcoming right total knee arthroplasty. Surgery scheduled with Dr. Edie on 05/12/2019 left undergo a right total knee replacement. The patient denies any changes in his medical history since he was last evaluated. He denies any numbness or tingling to the right lower extremity. He denies any history of heart attack or stroke. He did suffer a DVT in his left leg after undergoing a left intertrochanteric hip fracture which she was treated with Xarelto. He is no longer on blood thinning medication. He is now taking a 81 mg aspirin  daily at this time. The patient denies any personal history of asthma or COPD. He is a diabetic. His most recent A1c was 6.9  Past Medical History: Abdominal hernia  Acute deep vein thrombosis (DVT) of left lower extremity (CMS/HHS-HCC) 12/10/2023 (post-surgically provoked. Started on xarelto)  Acute deep vein thrombosis (DVT) of popliteal vein of right lower extremity (CMS/HHS-HCC) 01/26/2021  Last Assessment & Plan: Formatting of this note might be different from the original. #Acute DVT nonocclusive popliteal vein right lower extremity [provoked postsurgical surgery]-on Eliquis  5 mg twice daily x 3 months of anticoagulation. STOPPED in end of July, 2022. #Reviewed the hypercoagulable work-up-essentially unremarkable; slightly abnormal DRV VT mixing study. 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 Viktoria in the past)  DJD (degenerative joint disease) of knee  Erectile dysfunction (Using cialis PRN w/ good effect)  GERD (gastroesophageal reflux disease)  Heme positive stool 2013 (Seen by Dr. Viktoria, but he said no need for further wrkp).  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 Elspeth Ahle at [facility], [place])  Reduction and internal fixation of displaced intertrochanteric left hip fracture with Biomet Affixis TFN nail w/ Dr. Edie  ARTHROSCOPY SHOULDER Right  VASECTOMY   Past Family History: Lymphoma Mother 8 (Non-hodgkins)  Macular degeneration Mother  Colon cancer Father  Colon polyps Brother  Prostate cancer Brother  Lung cancer Paternal Uncle  Liver cancer Paternal Uncle  Leukemia Paternal Uncle  Reflux disease Brother  Cataracts Brother  Prostate cancer Brother  Prostate cancer Paternal Uncle  Macular degeneration Maternal Grandmother   Medications: acetaminophen  (TYLENOL ) 500 MG tablet Take 500-1,000 mg by mouth every 8 (eight) hours as needed for Pain  alendronate (FOSAMAX) 70 MG tablet Take 1 tablet (70 mg total) by mouth every 7 (seven) days Take on an empty stomach with a full glass of water. Avoid mineral or well water. Do not eat or take other medications for at least 30 minutes after dose. Sit or stand for at least 30 minutes after dose. 12 tablet 1  aspirin  81 MG EC tablet Take 81 mg by mouth once daily.  calcium  carbonate 600 mg calcium  (1,500 mg) Tab tablet Take 600 mg by mouth once daily  enalapril  (VASOTEC ) 5  MG tablet TAKE 2 TABLETS BY MOUTH ONCE DAILY 180 tablet 1  glipiZIDE-metFORMIN  (METAGLIP) 2.5-500 mg tablet Take 1-2  tablets by mouth 2 (two) times daily with meals Take 2 tablets in the morning and 1 in the evening. 270 tablet 1  Herbal Supplement Herbal Name: Omega XL 1 capsule by mouth daily  rivaroxaban (XARELTO) 20 mg tablet Take 1 tablet (20 mg total) by mouth daily with breakfast Start daily dosing after finishing all of the 15 mg twice a day pills. 90 tablet 1  rosuvastatin  (CRESTOR ) 5 MG tablet TAKE 1 TABLET BY MOUTH EVERY OTHER DAY 45 tablet 1  tiZANidine  (ZANAFLEX ) 2 MG tablet Take 2 mg by mouth every 6 (six) hours as needed  VITAMIN D3 ORAL Take 1 capsule by mouth once daily  ZINC ORAL Take 1 tablet by mouth once daily   Allergies: Rosuvastatin  Muscle Pain and Other (Is taking every other day and he feels much better as of 12/30/2016)   Review of Systems:  A comprehensive 14 point ROS was performed, reviewed by me today, and the pertinent orthopaedic findings are documented in the HPI.  Physical Exam: BP (!) 174/102  Ht 177.8 cm (5' 10)  Wt 93 kg (205 lb)  BMI 29.41 kg/m  General/Constitutional: The patient appears to be well-nourished, well-developed, and in no acute distress. Neuro/Psych: Normal mood and affect, oriented to person, place and time. Eyes: Non-icteric. Pupils are equal, round, and reactive to light, and exhibit synchronous movement. ENT: Unremarkable. Lymphatic: No palpable adenopathy. Respiratory: Non-labored breathing Cardiovascular: No edema, swelling or tenderness, except as noted in detailed exam. Integumentary: No impressive skin lesions present, except as noted in detailed exam. Musculoskeletal: Unremarkable, except as noted in detailed exam.  Right knee exam: The patient presents today using a walker for assistance with ambulation. Skin examination today's visit demonstrates no open wound, erythema or ecchymosis. The patient does have a mild right knee effusion. He does have moderate pain with palpation along the medial and lateral joint line of the right knee.  The patient is able to extend the right knee close to -10 degrees. The patient is able to flex the knee close to 5 degrees. The right knee is stable to varus and valgus stress testing at today's visit. Negative Lachman's test. He has intact sensation to light touch on the right lower extremity. Cap refills intact to each individual toe. Dorsalis pedis and posterior DeBellis pulse are intact to right lower extremity.  Imaging: None.  Impression: 1. Primary osteoarthritis of right knee  Plan:  1. Treatment options were discussed today with the patient. 2. The patient was scheduled for a right total knee arthroplasty with Dr. Edie on 05/11/2024. 3. The patient was instructed on the risk and benefits of surgical intervention and wishes to proceed at this time. 4. This document will serve as a surgical history and physical for the patient. 5. The patient will follow-up per standard postop protocol. They can call the clinic they have any questions, new symptoms develop or symptoms worsen.  The procedure was discussed with the patient, as were the potential risks (including bleeding, infection, nerve and/or blood vessel injury, persistent or recurrent pain, failure of the repair, progression of arthritis, need for further surgery, blood clots, strokes, heart attacks and/or arhythmias, pneumonia, etc.) and benefits. The patient states his understanding and wishes to proceed.    H&P reviewed and patient re-examined. No changes.

## 2024-05-11 NOTE — Progress Notes (Signed)
 This patient is not able to walk the distance required to go the bathroom, or he is unable to safely negotiate stairs required to access the bathroom.  A 3-in-1 BSC will alleviate this problem.

## 2024-05-11 NOTE — Discharge Instructions (Addendum)
 Orthopedic discharge instructions: May shower with intact OpSite dressing. Apply ice frequently to knee or use Polar Care. Start Eliquis  1 tablet (5 mg) twice daily on Wednesday, 05/12/2024, for 2 weeks, then take aspirin  325 mg twice daily for 4 weeks. Take pain medication as prescribed when needed.  May supplement with ES Tylenol  if necessary. May weight-bear as tolerated on right leg - use walker for balance and support. Follow-up in 10-14 days or as scheduled.   Information for Discharge Teaching: EXPAREL  (bupivacaine  liposome injectable suspension)   Pain relief is important to your recovery. The goal is to control your pain so you can move easier and return to your normal activities as soon as possible after your procedure. Your physician may use several types of medicines to manage pain, swelling, and more.  Your surgeon or anesthesiologist gave you EXPAREL (bupivacaine ) to help control your pain after surgery.  EXPAREL  is a local anesthetic designed to release slowly over an extended period of time to provide pain relief by numbing the tissue around the surgical site. EXPAREL  is designed to release pain medication over time and can control pain for up to 72 hours. Depending on how you respond to EXPAREL , you may require less pain medication during your recovery. EXPAREL  can help reduce or eliminate the need for opioids during the first few days after surgery when pain relief is needed the most. EXPAREL  is not an opioid and is not addictive. It does not cause sleepiness or sedation.   Important! A teal colored band has been placed on your arm with the date, time and amount of EXPAREL  you have received. Please leave this armband in place for the full 96 hours following administration, and then you may remove the band. If you return to the hospital for any reason within 96 hours following the administration of EXPAREL , the armband provides important information that your health care  providers to know, and alerts them that you have received this anesthetic.    Possible side effects of EXPAREL : Temporary loss of sensation or ability to move in the area where medication was injected. Nausea, vomiting, constipation Rarely, numbness and tingling in your mouth or lips, lightheadedness, or anxiety may occur. Call your doctor right away if you think you may be experiencing any of these sensations, or if you have other questions regarding possible side effects.  Follow all other discharge instructions given to you by your surgeon or nurse. Eat a healthy diet and drink plenty of water or other fluids.

## 2024-05-11 NOTE — Transfer of Care (Signed)
 Immediate Anesthesia Transfer of Care Note  Patient: Douglas Lyons  Procedure(s) Performed: ARTHROPLASTY, KNEE, TOTAL (Right: Knee)  Patient Location: PACU  Anesthesia Type:Spinal  Level of Consciousness: awake, alert , and patient cooperative  Airway & Oxygen Therapy: Patient Spontanous Breathing and Patient connected to face mask oxygen  Post-op Assessment: Report given to RN and Post -op Vital signs reviewed and stable  Post vital signs: Reviewed and stable  Last Vitals:  Vitals Value Taken Time  BP 118/63   Temp    Pulse 66 05/11/24 09:58  Resp 18 05/11/24 09:58  SpO2 97 % 05/11/24 09:58  Vitals shown include unfiled device data.  Last Pain:  Vitals:   05/11/24 0616  TempSrc: Temporal  PainSc: 0-No pain         Complications: No notable events documented.

## 2024-05-11 NOTE — Progress Notes (Signed)
 Patient has all equipment for home to have safe home care, doing well, pain is minimal/denies pain. Ambulating with assist and walker, tolerating po intake, voiding without difficulty. Instructions given to patient and wife, all questions answered to patient satisfaction.

## 2024-05-11 NOTE — Op Note (Signed)
 05/11/2024  9:49 AM  Patient:   Douglas Lyons  Pre-Op Diagnosis:   Degenerative joint disease, right knee.  Post-Op Diagnosis:   Same  Procedure:   Right TKA using all-pressfit Zimmer Persona system with a #11 PCR femur, a(n) G-sized  tibial tray with a 10 mm medial congruent E-poly insert, and a 9.5 x 35 mm all-poly 3-pegged domed patella.  Surgeon:   DOROTHA Reyes Maltos, MD  Assistant:   Gustavo Level, PA-C; Dozier Cuba, PA-S  Anesthesia:   Spinal  Findings:   As above  Complications:   None  EBL:   5 cc  Fluids:   500 cc crystalloid  UOP:   None  TT:   90 minutes at 300 mmHg  Drains:   None  Closure:   Staples  Implants:   As above  Brief Clinical Note:   The patient is an 78 year old male with a long history of progressively worsening right knee pain. The patient's symptoms have progressed despite medications, activity modification, injections, etc. The patient's history and examination were consistent with advanced degenerative joint disease of the right knee confirmed by plain radiographs. The patient presents at this time for a right total knee arthroplasty.  Procedure:   The patient was brought into the operating room. After adequate spinal anesthesia was obtained, the patient was repositioned in the supine position on the operating room table. The right lower extremity was prepped with ChloraPrep solution and draped sterilely. Preoperative antibiotics were administered. A timeout was performed to verify the appropriate surgical site before the limb was exsanguinated with an Esmarch and the tourniquet inflated to 300 mmHg.   A standard anterior approach to the knee was made through an approximately 6-7 inch incision. The incision was carried down through the subcutaneous tissues to expose superficial retinaculum. This was split the length of the incision and the medial flap elevated sufficiently to expose the medial retinaculum. The medial retinaculum was incised,  leaving a 3-4 mm cuff of tissue on the patella. This was extended distally along the medial border of the patellar tendon and proximally through the medial third of the quadriceps tendon. A subtotal fat pad excision was performed before the soft tissues were elevated off the anteromedial and anterolateral aspects of the proximal tibia to the level of the collateral ligaments. The anterior portions of the medial and lateral menisci were removed, as was the anterior cruciate ligament. With the knee flexed to 90, the external tibial guide was positioned and the appropriate proximal tibial cut made. This piece was taken to the back table where it was measured and found to be optimally replicated by a(n) G-sized component.  Attention was directed to the distal femur. The intramedullary canal was accessed through a 3/8 drill hole. The intramedullary guide was inserted and placed at 5 of valgus alignment. Using the +0 slot, the distal cut was made. The distal femur was measured and found to be optimally replicated by the #11 component. The #11 4-in-1 cutting block was positioned and first the posterior, then the posterior chamfer, the anterior, and finally the anterior chamfer cuts were made after verifying that the anterior cortex would not be notched.   At this point, the posterior portions medial and lateral menisci were removed. A trial reduction was performed using the appropriate femoral and tibial components with the 10 mm insert. This demonstrated excellent stability to varus and valgus stressing both in flexion and extension while permitting full extension. Patellar tracking was assessed and found to  be excellent. The tibial trial position was marked on the proximal tibia. The patella thickness was measured and found to be 20 mm, so the appropriate cut was made. The patellar surface was measured and found to be optimally replicated by the 35 mm component. The three peg holes were drilled in place before the  trial button was inserted. Patella tracking was assessed and found to be excellent, passing the no thumb test. The lug holes were drilled into the distal femur before the trial component was removed.  The tibial tray was repositioned before the keel was created using the appropriate tower, drills, and punch.  The bony surfaces were prepared for implantation by irrigating them thoroughly with sterile saline solution via the jet lavage system. A bone plug was fashioned from some of the bone that had been removed previously and used to plug the distal femoral canal. In addition, a cocktail of 20 cc of Exparel , 30 cc of 0.5% Sensorcaine , 2 cc of Kenalog  40 (80 mg), and 30 mg of Toradol  diluted out to 90 cc with normal saline was injected into the postero-medial and postero-lateral aspects of the knee, the medial and lateral gutter regions, and the peri-incisional tissues to help with postoperative analgesia.    The tibial tray was impacted into place first with care taken to be sure the component was fully seated. Next, the femoral component was impacted into place again with care taken to be sure that the component was fully improperly seated. The permanent 10 mm medial congruent E-polyethylene insert was snapped into place with care taken to ensure appropriate locking of the insert. Finally, the patella was positioned and compressed into place using the patellar clamp. Again, care was taken to be sure that the component was fully seated. The knee was placed through a range of motion with the findings as described above.    The wound was copiously irrigated with sterile saline solution using the jet lavage system before the quadriceps tendon and retinacular layer were reapproximated using #0 Vicryl interrupted sutures. The superficial retinacular layer also was closed using a running #0 Vicryl suture. The subcutaneous tissues were closed in several layers using 2-0 Vicryl interrupted sutures. The skin was  closed using staples. A sterile honeycomb dressing was applied to the skin before the leg was wrapped with an Ace wrap to accommodate the Polar Care device. The patient was then awakened and returned to the recovery room in satisfactory condition after tolerating the procedure well.

## 2024-05-11 NOTE — Evaluation (Signed)
 Physical Therapy Evaluation Patient Details Name: Douglas Lyons MRN: [id number] DOB: May 24, 1946 Today's Date: 05/11/2024  History of Present Illness  Patient is a 78 year old male with degenerative joint disease, right knee. S/p right TKA. PMH: DVT, DM, left hip fracture s/p IM nail, cervical surgery, right shoulder surgery, HTN.  Clinical Impression  Patient is agreeable to PT evaluation. He uses a rolling walker at baseline. Gait training initiated. Cues required for sequencing of rolling walker and BLE with cues for improved gait kinematics. Stair training completed. Education for positioning to promote knee ROM. Patient is eager to go home today. Mobility is adequate for discharge home with family support.       If plan is discharge home, recommend the following: Assist for transportation;Help with stairs or ramp for entrance   Can travel by private vehicle        Equipment Recommendations None recommended by PT (patient declined 3-in-1)  Recommendations for Other Services       Functional Status Assessment Patient has had a recent decline in their functional status and demonstrates the ability to make significant improvements in function in a reasonable and predictable amount of time.     Precautions / Restrictions Precautions Precautions: Fall;Knee Precaution Booklet Issued: Yes (comment) Recall of Precautions/Restrictions: Intact Restrictions Weight Bearing Restrictions Per Provider Order: Yes RLE Weight Bearing Per Provider Order: Weight bearing as tolerated      Mobility  Bed Mobility Overal bed mobility: Modified Independent                  Transfers Overall transfer level: Needs assistance Equipment used: Rolling walker (2 wheels) Transfers: Sit to/from Stand Sit to Stand: Supervision           General transfer comment: cues for RLE positioning wtih sitting and standing    Ambulation/Gait Ambulation/Gait assistance: Supervision Gait Distance  (Feet):  (133ft, 72ft) Assistive device: Rolling walker (2 wheels) Gait Pattern/deviations: Step-to pattern, Step-through pattern, Decreased stance time - right, Trunk flexed Gait velocity: decreased     General Gait Details: cues for sequencing of BLE and rolling walker, increasing step length on RLE, and to avoid trunk flexion. improved gait pattern and speed with increased ambulation distance  Stairs Stairs: Yes Stairs assistance: Contact guard assist Stair Management: Two rails, Step to pattern, Forwards, Backwards Number of Stairs: 2 General stair comments: patient went forwards, down backwards using bilatearl rails. cues for sequencing  Wheelchair Mobility     Tilt Bed    Modified Rankin (Stroke Patients Only)       Balance Overall balance assessment: Needs assistance Sitting-balance support: Feet supported Sitting balance-Leahy Scale: Good     Standing balance support: Bilateral upper extremity supported, Reliant on assistive device for balance Standing balance-Leahy Scale: Fair                               Pertinent Vitals/Pain Pain Assessment Pain Assessment: Faces Faces Pain Scale: Hurts a little bit Pain Location: R knee Pain Descriptors / Indicators: Dull Pain Intervention(s): Monitored during session, Limited activity within patient's tolerance, Repositioned    Home Living Family/patient expects to be discharged to:: Private residence Living Arrangements: Spouse/significant other Available Help at Discharge: Family;Available 24 hours/day Type of Home: House Home Access: Stairs to enter Entrance Stairs-Rails: Can reach both;Left;Right Entrance Stairs-Number of Steps: 3   Home Layout: One level Home Equipment: Agricultural consultant (2 wheels);Rollator (4 wheels)  Prior Function Prior Level of Function : Independent/Modified Independent             Mobility Comments: rolling walker       Extremity/Trunk Assessment   Upper  Extremity Assessment Upper Extremity Assessment: Overall WFL for tasks assessed    Lower Extremity Assessment Lower Extremity Assessment: RLE deficits/detail RLE Deficits / Details: can SLR independently. active movement of ankle,knee,hip RLE Sensation: WNL       Communication   Communication Communication: No apparent difficulties    Cognition Arousal: Alert Behavior During Therapy: WFL for tasks assessed/performed   PT - Cognitive impairments: No apparent impairments                         Following commands: Intact       Cueing Cueing Techniques: Verbal cues     General Comments General comments (skin integrity, edema, etc.): education for positioning of RLE to promote knee ROM    Exercises Total Joint Exercises Goniometric ROM: right knee flexion 96 degrees   Assessment/Plan    PT Assessment Patient needs continued PT services  PT Problem List Decreased strength;Decreased range of motion;Decreased activity tolerance;Decreased balance;Decreased mobility       PT Treatment Interventions DME instruction;Stair training;Gait training;Functional mobility training;Therapeutic activities;Therapeutic exercise;Neuromuscular re-education;Balance training;Cognitive remediation;Patient/family education    PT Goals (Current goals can be found in the Care Plan section)  Acute Rehab PT Goals Patient Stated Goal: home PT Goal Formulation: With patient Time For Goal Achievement: 05/25/24 Potential to Achieve Goals: Good    Frequency BID     Co-evaluation               AM-PAC PT 6 Clicks Mobility  Outcome Measure Help needed turning from your back to your side while in a flat bed without using bedrails?: None Help needed moving from lying on your back to sitting on the side of a flat bed without using bedrails?: A Little Help needed moving to and from a bed to a chair (including a wheelchair)?: A Little Help needed standing up from a chair using your  arms (e.g., wheelchair or bedside chair)?: A Little Help needed to walk in hospital room?: A Little Help needed climbing 3-5 steps with a railing? : A Little 6 Click Score: 19    End of Session Equipment Utilized During Treatment: Gait belt Activity Tolerance: Patient tolerated treatment well Patient left: in bed;with call bell/phone within reach;with family/visitor present Nurse Communication: Mobility status PT Visit Diagnosis: Difficulty in walking, not elsewhere classified (R26.2);Other abnormalities of gait and mobility (R26.89)    Time: 8494-8444 PT Time Calculation (min) (ACUTE ONLY): 50 min   Charges:   PT Evaluation $PT Eval Low Complexity: 1 Low PT Treatments $Gait Training: 23-37 mins PT General Charges $$ ACUTE PT VISIT: 1 Visit         Randine Essex, PT, MPT   Randine LULLA Essex 05/11/2024, 4:04 PM

## 2024-05-11 NOTE — Progress Notes (Signed)
 Ted hose sent home with patient, instructions given to wife and daughter Samule

## 2024-05-11 NOTE — Anesthesia Preprocedure Evaluation (Signed)
 Anesthesia Evaluation  Patient identified by MRN, date of birth, ID band Patient awake    Reviewed: Allergy & Precautions, NPO status , Patient's Chart, lab work & pertinent test results  History of Anesthesia Complications Negative for: history of anesthetic complications  Airway Mallampati: III  TM Distance: <3 FB Neck ROM: full    Dental  (+) Chipped   Pulmonary neg pulmonary ROS, neg shortness of breath   Pulmonary exam normal        Cardiovascular Exercise Tolerance: Good hypertension, (-) angina (-) Past MI and (-) DOE Normal cardiovascular exam     Neuro/Psych negative neurological ROS  negative psych ROS   GI/Hepatic Neg liver ROS,GERD  Controlled,,  Endo/Other  diabetes, Type 2    Renal/GU Renal disease     Musculoskeletal   Abdominal   Peds  Hematology negative hematology ROS (+)   Anesthesia Other Findings Past Medical History: No date: Cataracts, bilateral No date: Degenerative joint disease of knee, left No date: Diabetes mellitus, type 2 (HCC) No date: Diverticulosis No date: Erectile dysfunction No date: Essential hypertension with goal blood pressure less than  140/90 No date: GERD (gastroesophageal reflux disease) No date: Heme positive stool No date: Hip fracture, left, closed, with routine healing, subsequent  encounter No date: Hyperlipidemia No date: Hypertension 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 No date: Other specified disorders of bone density and structure,  left thigh No date: Overweight (BMI 25.0-29.9) No date: Postoperative acute deep vein thrombosis (DVT) of lower  extremity, subsequent encounter [facility])  Past Surgical History: 12/01/2020: ANTERIOR CERVICAL DECOMP/DISCECTOMY FUSION; N/A     Comment:  Procedure: ANTERIOR CERVICAL DECOMPRESSION/DISCECTOMY               FUSION 2 LEVELS C3-4, C4-5;  Surgeon: Bluford Standing, MD;                Location: [facility] ORS;  Service:  Neurosurgery;  Laterality:               N/A; No date: COLONOSCOPY 07/26/2015: COLONOSCOPY; N/A     Comment:  Procedure: COLONOSCOPY;  Surgeon: Lamar ONEIDA Holmes, MD;               Location: [facility] ENDOSCOPY;  Service: Endoscopy;                Laterality: N/A; 09/29/2020: COLONOSCOPY; N/A     Comment:  Procedure: COLONOSCOPY;  Surgeon: Maryruth Ole ONEIDA,               MD;  Location: [facility] ENDOSCOPY;  Service: Endoscopy;                Laterality: N/A; No date: EYE SURGERY; Bilateral 11/24/2023: INTRAMEDULLARY (IM) NAIL INTERTROCHANTERIC; Left     Comment:  Procedure: FIXATION, FRACTURE, INTERTROCHANTERIC, WITH               INTRAMEDULLARY ROD;  Surgeon: Edie Norleen PARAS, MD;                Location: [facility] ORS;  Service: Orthopedics;  Laterality:               Left; No date: SHOULDER ARTHROSCOPY; Right No date: VASECTOMY     Reproductive/Obstetrics negative OB ROS                              Anesthesia Physical Anesthesia Plan  ASA: 3  Anesthesia Plan: Spinal   Post-op Pain Management:  Induction:   PONV Risk Score and Plan:   Airway Management Planned: Natural Airway and Nasal Cannula  Additional Equipment:   Intra-op Plan:   Post-operative Plan:   Informed Consent: I have reviewed the patients History and Physical, chart, labs and discussed the procedure including the risks, benefits and alternatives for the proposed anesthesia with the patient or authorized representative who has indicated his/her understanding and acceptance.     Dental Advisory Given  Plan Discussed with: Anesthesiologist, CRNA and Surgeon  Anesthesia Plan Comments: (Patient reports no bleeding problems and no anticoagulant use.  Plan for spinal with backup GA  Patient consented for risks of anesthesia including but not limited to:  - adverse reactions to medications - damage to eyes, teeth, lips or other oral mucosa - nerve damage due to positioning  - risk of  bleeding, infection and or nerve damage from spinal that could lead to paralysis - risk of headache or failed spinal - damage to teeth, lips or other oral mucosa - sore throat or hoarseness - damage to heart, brain, nerves, lungs, other parts of body or loss of life  Patient voiced understanding and assent.)        Anesthesia Quick Evaluation

## 2024-05-11 NOTE — Anesthesia Procedure Notes (Signed)
 Spinal  Patient location during procedure: OR Start time: 05/11/2024 7:34 AM End time: 05/11/2024 7:36 AM Reason for block: surgical anesthesia Staffing Performed: other anesthesia staff  Anesthesiologist: Piscitello, Fairy POUR, MD Resident/CRNA: Niemzyk, Haiyun, CRNA Other anesthesia staff: Esvin Hnat E, RN Performed by: Leavy Ned, MD Authorized by: Leavy Ned, MD   Preanesthetic Checklist Completed: patient identified, IV checked, site marked, risks and benefits discussed, surgical consent, monitors and equipment checked, pre-op evaluation and timeout performed Spinal Block Patient position: sitting Prep: DuraPrep Patient monitoring: heart rate, continuous pulse ox and blood pressure Approach: midline Location: L3-4 Injection technique: single-shot Needle Needle type: Pencan  Needle gauge: 24 G Needle length: 9 cm Assessment Sensory level: T4 Events: CSF return Additional Notes No blood return. Free flowing CSF confirmed.

## 2024-05-12 ENCOUNTER — Encounter: Payer: Self-pay | Admitting: Surgery

## 2024-05-12 NOTE — Anesthesia Postprocedure Evaluation (Signed)
 Anesthesia Post Note  Patient: Douglas Lyons  Procedure(s) Performed: ARTHROPLASTY, KNEE, TOTAL (Right: Knee)  Patient location during evaluation: Other Anesthesia Type: Spinal Level of consciousness: oriented and awake and alert Anesthetic complications: no Comments: Pt dc'd to home   No notable events documented.   Last Vitals:  Vitals:   05/11/24 1200 05/11/24 1627  BP: 131/72 (!) 170/89  Pulse: 61 94  Resp: 16 15  Temp:  (!) 36.1 C  SpO2: 99% 98%    Last Pain:  Vitals:   05/11/24 1627  TempSrc: Temporal  PainSc: 0-No pain                 Leontine Katz P

## 2024-05-14 ENCOUNTER — Other Ambulatory Visit: Payer: Self-pay | Admitting: Surgery

## 2024-05-14 DIAGNOSIS — M1712 Unilateral primary osteoarthritis, left knee: Secondary | ICD-10-CM

## 2024-05-19 ENCOUNTER — Ambulatory Visit
Admission: RE | Admit: 2024-05-19 | Discharge: 2024-05-19 | Disposition: A | Discharge status: Home / Self Care | Source: Ambulatory Visit | Attending: Surgery | Admitting: Surgery

## 2024-05-19 DIAGNOSIS — M1712 Unilateral primary osteoarthritis, left knee: Secondary | ICD-10-CM | POA: Diagnosis present

## 2024-05-31 ENCOUNTER — Other Ambulatory Visit: Payer: Self-pay | Admitting: Surgery

## 2024-05-31 ENCOUNTER — Ambulatory Visit
Admission: RE | Admit: 2024-05-31 | Discharge: 2024-05-31 | Disposition: A | Discharge status: Home / Self Care | Source: Ambulatory Visit | Attending: Surgery | Admitting: Surgery

## 2024-05-31 DIAGNOSIS — Z96651 Presence of right artificial knee joint: Secondary | ICD-10-CM | POA: Diagnosis present

## 2024-05-31 DIAGNOSIS — M1711 Unilateral primary osteoarthritis, right knee: Secondary | ICD-10-CM | POA: Insufficient documentation

## 2024-06-11 ENCOUNTER — Other Ambulatory Visit: Payer: Self-pay | Admitting: Surgery

## 2024-06-24 ENCOUNTER — Encounter
Admission: RE | Admit: 2024-06-24 | Discharge: 2024-06-24 | Disposition: A | Discharge status: Home / Self Care | Source: Ambulatory Visit | Attending: Surgery | Admitting: Surgery

## 2024-06-24 ENCOUNTER — Other Ambulatory Visit: Payer: Self-pay

## 2024-06-24 VITALS — BP 140/90 | HR 81 | Resp 16 | Ht 71.0 in | Wt 200.4 lb

## 2024-06-24 DIAGNOSIS — Z01812 Encounter for preprocedural laboratory examination: Secondary | ICD-10-CM | POA: Diagnosis present

## 2024-06-24 DIAGNOSIS — Z01818 Encounter for other preprocedural examination: Secondary | ICD-10-CM

## 2024-06-24 HISTORY — DX: Personal history of other diseases of urinary system: Z87.448

## 2024-06-24 HISTORY — DX: Sepsis, unspecified organism: A41.9

## 2024-06-24 LAB — URINALYSIS, ROUTINE W REFLEX MICROSCOPIC
Bilirubin Urine: NEGATIVE
Glucose, UA: NEGATIVE mg/dL
Hgb urine dipstick: NEGATIVE
Ketones, ur: NEGATIVE mg/dL
Leukocytes,Ua: NEGATIVE
Nitrite: NEGATIVE
Protein, ur: NEGATIVE mg/dL
Specific Gravity, Urine: 1.012 (ref 1.005–1.030)
pH: 5 (ref 5.0–8.0)

## 2024-06-24 LAB — SURGICAL PCR SCREEN
MRSA, PCR: NEGATIVE
Staphylococcus aureus: NEGATIVE

## 2024-06-24 MED ORDER — CEFAZOLIN SODIUM-DEXTROSE 2-4 GM/100ML-% IV SOLN
2.0000 g | INTRAVENOUS | Status: DC
  Filled 2024-06-24: qty 100

## 2024-06-24 NOTE — Patient Instructions (Addendum)
 Your procedure is scheduled on:06-29-24 Tuesday Report to the Registration Desk on the 1st floor of the [facility].Then proceed to the 2nd floor Surgery Desk To find out your arrival time, please call [phone] between 1PM - 3PM on:06-28-24 Monday If your arrival time is 6:00 am, do not arrive before that time as the [facility] entrance doors do not open until 6:00 am.  REMEMBER: Instructions that are not followed completely may result in serious medical risk, up to and including death; or upon the discretion of your surgeon and anesthesiologist your surgery may need to be rescheduled.  Do not eat food after midnight the night before surgery.  No gum chewing or hard candies.  You may however, drink Water up to 2 hours before you are scheduled to arrive for your surgery. Do not drink anything within 2 hours of your scheduled arrival time.  In addition, your doctor has ordered for you to drink the provided:  Gatorade G2 Drinking this carbohydrate drink up to two hours before surgery helps to reduce insulin  resistance and improve patient outcomes. Please complete drinking 2 hours before scheduled arrival time.  One week prior to surgery:Stop NOW (06-24-24) Stop Anti-inflammatories (NSAIDS) such as Advil, Aleve, Ibuprofen, Motrin, Naproxen, Naprosyn and Aspirin  based products such as Excedrin, Goody's Powder, BC Powder. Stop ANY OVER THE COUNTER supplements until after surgery (Vitamin D3)  You may however, continue to take Tylenol  if needed for pain up until the day of surgery.  Stop glipiZIDE-metformin  (METAGLIP) 2 days prior to surgery-Last dose will be on 06-26-24 Saturday  (Stop your 325 mg NOW 06-24-24)-Start an 81 mg Aspirin  today (06-24-24) and continue up until the day prior to surgery-Do NOT take the morning of surgery   Continue taking all of your other prescription medications up until the day of surgery.  Do NOT take any medication the day of surgery  No Alcohol for 24  hours before or after surgery.  No Smoking including e-cigarettes for 24 hours before surgery.  No chewable tobacco products for at least 6 hours before surgery.  No nicotine patches on the day of surgery.  Do not use any recreational drugs for at least a week (preferably 2 weeks) before your surgery.  Please be advised that the combination of cocaine and anesthesia may have negative outcomes, up to and including death. If you test positive for cocaine, your surgery will be cancelled.  On the morning of surgery brush your teeth with toothpaste and water, you may rinse your mouth with mouthwash if you wish. Do not swallow any toothpaste or mouthwash.  Use CHG Soap as directed on instruction sheet.  Do not wear jewelry, make-up, hairpins, clips or nail polish.  For welded (permanent) jewelry: bracelets, anklets, waist bands, etc.  Please have this removed prior to surgery.  If it is not removed, there is a chance that hospital personnel will need to cut it off on the day of surgery.  Do not wear lotions, powders, or perfumes.   Do not shave body hair from the neck down 48 hours before surgery.  Contact lenses, hearing aids and dentures may not be worn into surgery.  Do not bring valuables to the hospital. [facility] is not responsible for any missing/lost belongings or valuables.   Notify your doctor if there is any change in your medical condition (cold, fever, infection).  Wear comfortable clothing (specific to your surgery type) to the hospital.  After surgery, you can help prevent lung complications by  doing breathing exercises.  Take deep breaths and cough every 1-2 hours. Your doctor may order a device called an Incentive Spirometer to help you take deep breaths. When coughing or sneezing, hold a pillow firmly against your incision with both hands. This is called "splinting." Doing this helps protect your incision. It also decreases belly discomfort.  If you are being  admitted to the hospital overnight, leave your suitcase in the car. After surgery it may be brought to your room.  In case of increased patient census, it may be necessary for you, the patient, to continue your postoperative care in the Same Day Surgery department.  If you are being discharged the day of surgery, you will not be allowed to drive home. You will need a responsible individual to drive you home and stay with you for 24 hours after surgery.   If you are taking public transportation, you will need to have a responsible individual with you.  Please call the Pre-admissions Testing Dept. at [phone] if you have any questions about these instructions.  Surgery Visitation Policy:  Patients having surgery or a procedure may have two visitors.  Children under the age of 16 must have an adult with them who is not the patient.  Inpatient Visitation:    Visiting hours are 7 a.m. to 8 p.m. Up to four visitors are allowed at one time in a patient room. The visitors may rotate out with other people during the day.  One visitor age 40 or older may stay with the patient overnight and must be in the room by 8 p.m.    Pre-operative 4 CHG Bath Instructions   You can play a key role in reducing the risk of infection after surgery. Your skin needs to be as free of germs as possible. You can reduce the number of germs on your skin by washing with CHG (chlorhexidine  gluconate) soap before surgery. CHG is an antiseptic soap that kills germs and continues to kill germs even after washing.   DO NOT use if you have an allergy to chlorhexidine /CHG or antibacterial soaps. If your skin becomes reddened or irritated, stop using the CHG and notify one of our RNs at [phone].   Please shower with the CHG soap starting 4 days before surgery using the following schedule:     Please keep in mind the following:  DO NOT shave, including legs and underarms, starting the day of your first shower.    You may shave your face at any point before/day of surgery.  Place clean sheets on your bed the day you start using CHG soap. Use a clean washcloth (not used since being washed) for each shower. DO NOT sleep with pets once you start using the CHG.   CHG Shower Instructions:  If you choose to wash your hair and private area, wash first with your normal shampoo/soap.  After you use shampoo/soap, rinse your hair and body thoroughly to remove shampoo/soap residue.  Turn the water OFF and apply about 3 tablespoons (45 ml) of CHG soap to a CLEAN washcloth.  Apply CHG soap ONLY FROM YOUR NECK DOWN TO YOUR TOES (washing for 3-5 minutes)  DO NOT use CHG soap on face, private areas, open wounds, or sores.  Pay special attention to the area where your surgery is being performed.  If you are having back surgery, having someone wash your back for you may be helpful. Wait 2 minutes after CHG soap is applied, then you may rinse  off the CHG soap.  Pat dry with a clean towel  Put on clean clothes/pajamas   If you choose to wear lotion, please use ONLY the CHG-compatible lotions on the back of this paper.     Additional instructions for the day of surgery: DO NOT APPLY any lotions, deodorants, cologne, or perfumes.   Put on clean/comfortable clothes.  Brush your teeth.  Ask your nurse before applying any prescription medications to the skin.      CHG Compatible Lotions   Aveeno Moisturizing lotion  Cetaphil Moisturizing Cream  Cetaphil Moisturizing Lotion  Clairol Herbal Essence Moisturizing Lotion, Dry Skin  Clairol Herbal Essence Moisturizing Lotion, Extra Dry Skin  Clairol Herbal Essence Moisturizing Lotion, Normal Skin  Curel Age Defying Therapeutic Moisturizing Lotion with Alpha Hydroxy  Curel Extreme Care Body Lotion  Curel Soothing Hands Moisturizing Hand Lotion  Curel Therapeutic Moisturizing Cream, Fragrance-Free  Curel Therapeutic Moisturizing Lotion, Fragrance-Free  Curel  Therapeutic Moisturizing Lotion, Original Formula  Eucerin Daily Replenishing Lotion  Eucerin Dry Skin Therapy Plus Alpha Hydroxy Crme  Eucerin Dry Skin Therapy Plus Alpha Hydroxy Lotion  Eucerin Original Crme  Eucerin Original Lotion  Eucerin Plus Crme Eucerin Plus Lotion  Eucerin TriLipid Replenishing Lotion  Keri Anti-Bacterial Hand Lotion  Keri Deep Conditioning Original Lotion Dry Skin Formula Softly Scented  Keri Deep Conditioning Original Lotion, Fragrance Free Sensitive Skin Formula  Keri Lotion Fast Absorbing Fragrance Free Sensitive Skin Formula  Keri Lotion Fast Absorbing Softly Scented Dry Skin Formula  Keri Original Lotion  Keri Skin Renewal Lotion Keri Silky Smooth Lotion  Keri Silky Smooth Sensitive Skin Lotion  Nivea Body Creamy Conditioning Oil  Nivea Body Extra Enriched Lotion  Nivea Body Original Lotion  Nivea Body Sheer Moisturizing Lotion Nivea Crme  Nivea Skin Firming Lotion  NutraDerm 30 Skin Lotion  NutraDerm Skin Lotion  NutraDerm Therapeutic Skin Cream  NutraDerm Therapeutic Skin Lotion  ProShield Protective Hand Cream  Provon moisturizing lotion  How to Use an Incentive Spirometer An incentive spirometer is a tool that measures how well you are filling your lungs with each breath. Learning to take long, deep breaths using this tool can help you keep your lungs clear and active. This may help to reverse or lessen your chance of developing breathing (pulmonary) problems, especially infection. You may be asked to use a spirometer: After a surgery. If you have a lung problem or a history of smoking. After a long period of time when you have been unable to move or be active. If the spirometer includes an indicator to show the highest number that you have reached, your health care provider or respiratory therapist will help you set a goal. Keep a log of your progress as told by your health care provider. What are the risks? Breathing too quickly may cause  dizziness or cause you to pass out. Take your time so you do not get dizzy or light-headed. If you are in pain, you may need to take pain medicine before doing incentive spirometry. It is harder to take a deep breath if you are having pain. How to use your incentive spirometer  Sit up on the edge of your bed or on a chair. Hold the incentive spirometer so that it is in an upright position. Before you use the spirometer, breathe out normally. Place the mouthpiece in your mouth. Make sure your lips are closed tightly around it. Breathe in slowly and as deeply as you can through your mouth, causing the piston or  the ball to rise toward the top of the chamber. Hold your breath for 3-5 seconds, or for as long as possible. If the spirometer includes a coach indicator, use this to guide you in breathing. Slow down your breathing if the indicator goes above the marked areas. Remove the mouthpiece from your mouth and breathe out normally. The piston or ball will return to the bottom of the chamber. Rest for a few seconds, then repeat the steps 10 or more times. Take your time and take a few normal breaths between deep breaths so that you do not get dizzy or light-headed. Do this every 1-2 hours when you are awake. If the spirometer includes a goal marker to show the highest number you have reached (best effort), use this as a goal to work toward during each repetition. After each set of 10 deep breaths, cough a few times. This will help to make sure that your lungs are clear. If you have an incision on your chest or abdomen from surgery, place a pillow or a rolled-up towel firmly against the incision when you cough. This can help to reduce pain while taking deep breaths and coughing. General tips When you are able to get out of bed: Walk around often. Continue to take deep breaths and cough in order to clear your lungs. Keep using the incentive spirometer until your health care provider says it is okay  to stop using it. If you have been in the hospital, you may be told to keep using the spirometer at home. Contact a health care provider if: You are having difficulty using the spirometer. You have trouble using the spirometer as often as instructed. Your pain medicine is not giving enough relief for you to use the spirometer as told. You have a fever. Get help right away if: You develop shortness of breath. You develop a cough with bloody mucus from the lungs. You have fluid or blood coming from an incision site after you cough. Summary An incentive spirometer is a tool that can help you learn to take long, deep breaths to keep your lungs clear and active. You may be asked to use a spirometer after a surgery, if you have a lung problem or a history of smoking, or if you have been inactive for a long period of time. Use your incentive spirometer as instructed every 1-2 hours while you are awake. If you have an incision on your chest or abdomen, place a pillow or a rolled-up towel firmly against your incision when you cough. This will help to reduce pain. Get help right away if you have shortness of breath, you cough up bloody mucus, or blood comes from your incision when you cough. This information is not intended to replace advice given to you by your health care provider. Make sure you discuss any questions you have with your health care provider. Document Revised: 06/20/2023 Document Reviewed: 06/20/2023 Elsevier Patient Education  2024 [employer].   [employer] Directory to address health-related social needs:  [email or web address]

## 2024-06-24 NOTE — Progress Notes (Signed)
 Pt on 325 mg asa since last 05-11-24 knee surgery with Dr Edie. Spoke with Dorise Pereyra NP regarding 325 mg asa. Instructed pt per Dorise Pereyra to stop 325 mg asa now (last dose was on 06-23-24) and to start 81 mg asa today and continue up until the day prior to surgery. Pt verbalized understanding

## 2024-06-28 MED ORDER — SODIUM CHLORIDE 0.9 % IV SOLN: INTRAVENOUS | Status: DC

## 2024-06-28 MED ORDER — ORAL CARE MOUTH RINSE: 15.0000 mL | Freq: Once | OROMUCOSAL | Status: AC

## 2024-06-28 MED ORDER — CHLORHEXIDINE GLUCONATE 0.12 % MT SOLN
15.0000 mL | Freq: Once | OROMUCOSAL | Status: AC
  Administered 2024-06-29: 15 mL via OROMUCOSAL

## 2024-06-29 ENCOUNTER — Ambulatory Visit

## 2024-06-29 ENCOUNTER — Encounter: Payer: Self-pay | Admitting: Surgery

## 2024-06-29 ENCOUNTER — Ambulatory Visit
Admission: RE | Admit: 2024-06-29 | Discharge: 2024-06-29 | Disposition: A | Discharge status: Home / Self Care | Attending: Surgery | Admitting: Surgery

## 2024-06-29 ENCOUNTER — Encounter
Admission: RE | Disposition: A | Discharge status: Home / Self Care | Payer: Self-pay | Source: Home / Self Care | Attending: Surgery

## 2024-06-29 ENCOUNTER — Other Ambulatory Visit: Payer: Self-pay

## 2024-06-29 DIAGNOSIS — E119 Type 2 diabetes mellitus without complications: Secondary | ICD-10-CM | POA: Insufficient documentation

## 2024-06-29 DIAGNOSIS — I10 Essential (primary) hypertension: Secondary | ICD-10-CM | POA: Diagnosis not present

## 2024-06-29 DIAGNOSIS — K219 Gastro-esophageal reflux disease without esophagitis: Secondary | ICD-10-CM | POA: Diagnosis not present

## 2024-06-29 DIAGNOSIS — Z7982 Long term (current) use of aspirin: Secondary | ICD-10-CM | POA: Insufficient documentation

## 2024-06-29 DIAGNOSIS — Z7984 Long term (current) use of oral hypoglycemic drugs: Secondary | ICD-10-CM | POA: Insufficient documentation

## 2024-06-29 DIAGNOSIS — Z7901 Long term (current) use of anticoagulants: Secondary | ICD-10-CM | POA: Insufficient documentation

## 2024-06-29 DIAGNOSIS — Z7983 Long term (current) use of bisphosphonates: Secondary | ICD-10-CM | POA: Diagnosis not present

## 2024-06-29 DIAGNOSIS — M1712 Unilateral primary osteoarthritis, left knee: Secondary | ICD-10-CM | POA: Insufficient documentation

## 2024-06-29 DIAGNOSIS — Z79899 Other long term (current) drug therapy: Secondary | ICD-10-CM | POA: Diagnosis not present

## 2024-06-29 DIAGNOSIS — Z01818 Encounter for other preprocedural examination: Secondary | ICD-10-CM

## 2024-06-29 HISTORY — PX: TOTAL KNEE ARTHROPLASTY: SHX125

## 2024-06-29 LAB — GLUCOSE, CAPILLARY
Glucose-Capillary: 247 mg/dL — ABNORMAL HIGH (ref 70–99)
Glucose-Capillary: 203 mg/dL — ABNORMAL HIGH (ref 70–99)

## 2024-06-29 SURGERY — ARTHROPLASTY, KNEE, TOTAL
Anesthesia: Spinal | Site: Knee | Laterality: Left

## 2024-06-29 MED ORDER — CEFAZOLIN SODIUM-DEXTROSE 2-4 GM/100ML-% IV SOLN
INTRAVENOUS | Status: AC
  Filled 2024-06-29: qty 100

## 2024-06-29 MED ORDER — ONDANSETRON HCL 4 MG/2ML IJ SOLN
INTRAMUSCULAR | Status: AC
  Filled 2024-06-29: qty 2

## 2024-06-29 MED ORDER — APIXABAN 2.5 MG PO TABS: 2.5000 mg | ORAL_TABLET | Freq: Two times a day (BID) | ORAL | 0 refills | Status: AC

## 2024-06-29 MED ORDER — DEXMEDETOMIDINE HCL IN NACL 80 MCG/20ML IV SOLN
INTRAVENOUS | Status: DC | PRN
  Administered 2024-06-29: 4 ug via INTRAVENOUS
  Administered 2024-06-29: 8 ug via INTRAVENOUS

## 2024-06-29 MED ORDER — PROPOFOL 10 MG/ML IV BOLUS
INTRAVENOUS | Status: AC
  Filled 2024-06-29: qty 20

## 2024-06-29 MED ORDER — FENTANYL CITRATE (PF) 100 MCG/2ML IJ SOLN
INTRAMUSCULAR | Status: AC
  Filled 2024-06-29: qty 2

## 2024-06-29 MED ORDER — SODIUM CHLORIDE (PF) 0.9 % IJ SOLN
INTRAMUSCULAR | Status: AC
  Filled 2024-06-29: qty 10

## 2024-06-29 MED ORDER — TRANEXAMIC ACID-NACL 1000-0.7 MG/100ML-% IV SOLN
1000.0000 mg | INTRAVENOUS | Status: AC
  Administered 2024-06-29: 1000 mg via INTRAVENOUS

## 2024-06-29 MED ORDER — PROPOFOL 500 MG/50ML IV EMUL
INTRAVENOUS | Status: DC | PRN
  Administered 2024-06-29: 100 ug/kg/min via INTRAVENOUS

## 2024-06-29 MED ORDER — CHLORHEXIDINE GLUCONATE 0.12 % MT SOLN
OROMUCOSAL | Status: AC
  Filled 2024-06-29: qty 15

## 2024-06-29 MED ORDER — SODIUM CHLORIDE 0.9 % BOLUS PEDS
250.0000 mL | Freq: Once | INTRAVENOUS | Status: AC
Start: 2024-06-29 — End: 2024-06-29
  Administered 2024-06-29: 250 mL via INTRAVENOUS

## 2024-06-29 MED ORDER — PHENYLEPHRINE HCL-NACL 20-0.9 MG/250ML-% IV SOLN: INTRAVENOUS | Status: DC | PRN

## 2024-06-29 MED ORDER — OXYCODONE HCL 5 MG PO TABS: 5.0000 mg | ORAL_TABLET | ORAL | 0 refills | Status: AC | PRN

## 2024-06-29 MED ORDER — ONDANSETRON HCL 4 MG/2ML IJ SOLN: 4.0000 mg | Freq: Four times a day (QID) | INTRAMUSCULAR | Status: DC | PRN

## 2024-06-29 MED ORDER — ONDANSETRON HCL 4 MG/2ML IJ SOLN
INTRAMUSCULAR | Status: DC | PRN
  Administered 2024-06-29: 4 mg via INTRAVENOUS

## 2024-06-29 MED ORDER — METOCLOPRAMIDE HCL 5 MG/ML IJ SOLN: 5.0000 mg | Freq: Three times a day (TID) | INTRAMUSCULAR | Status: DC | PRN

## 2024-06-29 MED ORDER — SODIUM CHLORIDE 0.9 % BOLUS PEDS
250.0000 mL | Freq: Once | INTRAVENOUS | Status: AC
  Administered 2024-06-29: 250 mL via INTRAVENOUS

## 2024-06-29 MED ORDER — INSULIN ASPART 100 UNIT/ML IJ SOLN
5.0000 [IU] | Freq: Once | INTRAMUSCULAR | Status: AC
  Administered 2024-06-29: 5 [IU] via SUBCUTANEOUS

## 2024-06-29 MED ORDER — ONDANSETRON HCL 4 MG PO TABS: 4.0000 mg | ORAL_TABLET | Freq: Four times a day (QID) | ORAL | Status: DC | PRN

## 2024-06-29 MED ORDER — SODIUM CHLORIDE 0.9 % IR SOLN
Status: DC | PRN
  Administered 2024-06-29: 3000 mL

## 2024-06-29 MED ORDER — CEFAZOLIN SODIUM-DEXTROSE 2-3 GM-%(50ML) IV SOLR
INTRAVENOUS | Status: DC | PRN
  Administered 2024-06-29: 2 g via INTRAVENOUS

## 2024-06-29 MED ORDER — KETOROLAC TROMETHAMINE 15 MG/ML IJ SOLN
15.0000 mg | Freq: Once | INTRAMUSCULAR | Status: AC
  Administered 2024-06-29: 15 mg via INTRAVENOUS

## 2024-06-29 MED ORDER — PROPOFOL 10 MG/ML IV BOLUS
INTRAVENOUS | Status: DC | PRN
  Administered 2024-06-29: 100 mg via INTRAVENOUS
  Administered 2024-06-29 (×2): 50 mg via INTRAVENOUS

## 2024-06-29 MED ORDER — BUPIVACAINE HCL (PF) 0.5 % IJ SOLN
INTRAMUSCULAR | Status: DC | PRN
  Administered 2024-06-29: 2 mL

## 2024-06-29 MED ORDER — KETOROLAC TROMETHAMINE 15 MG/ML IJ SOLN
INTRAMUSCULAR | Status: AC
  Filled 2024-06-29: qty 1

## 2024-06-29 MED ORDER — CEFAZOLIN SODIUM-DEXTROSE 2-4 GM/100ML-% IV SOLN
2.0000 g | Freq: Four times a day (QID) | INTRAVENOUS | Status: DC
  Administered 2024-06-29: 2 g via INTRAVENOUS

## 2024-06-29 MED ORDER — BUPIVACAINE HCL (PF) 0.5 % IJ SOLN
INTRAMUSCULAR | Status: AC
  Filled 2024-06-29: qty 10

## 2024-06-29 MED ORDER — BUPIVACAINE-EPINEPHRINE (PF) 0.5% -1:200000 IJ SOLN
INTRAMUSCULAR | Status: DC | PRN
  Administered 2024-06-29: 30 mL

## 2024-06-29 MED ORDER — TRANEXAMIC ACID-NACL 1000-0.7 MG/100ML-% IV SOLN
INTRAVENOUS | Status: AC
  Filled 2024-06-29: qty 100

## 2024-06-29 MED ORDER — FENTANYL CITRATE (PF) 100 MCG/2ML IJ SOLN: 25.0000 ug | INTRAMUSCULAR | Status: DC | PRN

## 2024-06-29 MED ORDER — BUPIVACAINE LIPOSOME 1.3 % IJ SUSP
INTRAMUSCULAR | Status: AC
  Filled 2024-06-29: qty 20

## 2024-06-29 MED ORDER — TRIAMCINOLONE ACETONIDE 40 MG/ML IJ SUSP
INTRAMUSCULAR | Status: DC | PRN
  Administered 2024-06-29: 80 mg via INTRAMUSCULAR

## 2024-06-29 MED ORDER — DEXMEDETOMIDINE HCL IN NACL 80 MCG/20ML IV SOLN
INTRAVENOUS | Status: AC
  Filled 2024-06-29: qty 20

## 2024-06-29 MED ORDER — OXYCODONE HCL 5 MG PO TABS: 5.0000 mg | ORAL_TABLET | ORAL | Status: DC | PRN

## 2024-06-29 MED ORDER — SODIUM CHLORIDE (PF) 0.9 % IJ SOLN
INTRAMUSCULAR | Status: AC
  Filled 2024-06-29: qty 50

## 2024-06-29 MED ORDER — INSULIN ASPART 100 UNIT/ML IJ SOLN
INTRAMUSCULAR | Status: AC
  Filled 2024-06-29: qty 1

## 2024-06-29 MED ORDER — PHENYLEPHRINE HCL-NACL 20-0.9 MG/250ML-% IV SOLN
INTRAVENOUS | Status: AC
  Filled 2024-06-29: qty 250

## 2024-06-29 MED ORDER — PHENYLEPHRINE 80 MCG/ML (10ML) SYRINGE FOR IV PUSH (FOR BLOOD PRESSURE SUPPORT)
PREFILLED_SYRINGE | INTRAVENOUS | Status: DC | PRN
  Administered 2024-06-29: 160 ug via INTRAVENOUS
  Administered 2024-06-29 (×3): 80 ug via INTRAVENOUS
  Administered 2024-06-29: 160 ug via INTRAVENOUS

## 2024-06-29 MED ORDER — FENTANYL CITRATE (PF) 100 MCG/2ML IJ SOLN
INTRAMUSCULAR | Status: DC | PRN
  Administered 2024-06-29: 50 ug via INTRAVENOUS

## 2024-06-29 MED ORDER — TRIAMCINOLONE ACETONIDE 40 MG/ML IJ SUSP
INTRAMUSCULAR | Status: AC
  Filled 2024-06-29: qty 2

## 2024-06-29 MED ORDER — OXYCODONE HCL 5 MG/5ML PO SOLN: 5.0000 mg | Freq: Once | ORAL | Status: DC | PRN

## 2024-06-29 MED ORDER — BUPIVACAINE-EPINEPHRINE (PF) 0.5% -1:200000 IJ SOLN
INTRAMUSCULAR | Status: AC
  Filled 2024-06-29: qty 30

## 2024-06-29 MED ORDER — PROPOFOL 1000 MG/100ML IV EMUL
INTRAVENOUS | Status: AC
  Filled 2024-06-29: qty 100

## 2024-06-29 MED ORDER — OXYCODONE HCL 5 MG PO TABS: 5.0000 mg | ORAL_TABLET | Freq: Once | ORAL | Status: DC | PRN

## 2024-06-29 MED ORDER — METOCLOPRAMIDE HCL 10 MG PO TABS: 5.0000 mg | ORAL_TABLET | Freq: Three times a day (TID) | ORAL | Status: DC | PRN

## 2024-06-29 MED ORDER — LIDOCAINE HCL (PF) 2 % IJ SOLN
INTRAMUSCULAR | Status: AC
  Filled 2024-06-29: qty 5

## 2024-06-29 MED ORDER — SODIUM CHLORIDE 0.9 % IV SOLN
INTRAVENOUS | Status: DC | PRN
  Administered 2024-06-29: 60 mL

## 2024-06-29 MED ORDER — SODIUM CHLORIDE 0.9 % IV SOLN: INTRAVENOUS | Status: DC

## 2024-06-29 MED ORDER — ACETAMINOPHEN 325 MG PO TABS: 325.0000 mg | ORAL_TABLET | Freq: Four times a day (QID) | ORAL | Status: DC | PRN

## 2024-06-29 MED ORDER — KETOROLAC TROMETHAMINE 30 MG/ML IJ SOLN
INTRAMUSCULAR | Status: DC | PRN
  Administered 2024-06-29: 30 mg via INTRAMUSCULAR

## 2024-06-29 SURGICAL SUPPLY — 53 items
BLADE SAW 90X13X1.19 OSCILLAT (BLADE) ×1 IMPLANT
BLADE SAW SAG 25X90X1.19 (BLADE) ×1 IMPLANT
BLADE SURG SZ20 CARB STEEL (BLADE) ×1 IMPLANT
BNDG COMPR 6X5.8 VLCR NS LF (GAUZE/BANDAGES/DRESSINGS) ×1 IMPLANT
CEMENT VACUUM MIXING SYSTEM (MISCELLANEOUS) ×1 IMPLANT
CHLORAPREP W/TINT 26 (MISCELLANEOUS) ×1 IMPLANT
COMPONENT FEM PS STD 11 LT (Joint) IMPLANT
COMPONENT PATELLA 3 PEG 35 (Joint) IMPLANT
COMPONET TIB PS G 0D LT (Joint) IMPLANT
COOLER ICEMAN CLASSIC (MISCELLANEOUS) ×1 IMPLANT
COVER MAYO STAND STRL (DRAPES) ×1 IMPLANT
CUFF TRNQT CYL 24X4X16.5-23 (TOURNIQUET CUFF) IMPLANT
CUFF TRNQT CYL 34X4.125X (TOURNIQUET CUFF) IMPLANT
DRAPE IMP U-DRAPE 54X76 (DRAPES) ×1 IMPLANT
DRAPE SHEET LG 3/4 BI-LAMINATE (DRAPES) ×1 IMPLANT
DRAPE U-SHAPE 47X51 STRL (DRAPES) ×1 IMPLANT
DRSG MEPILEX SACRM 8.7X9.8 (GAUZE/BANDAGES/DRESSINGS) IMPLANT
DRSG OPSITE POSTOP 4X10 (GAUZE/BANDAGES/DRESSINGS) ×1 IMPLANT
DRSG OPSITE POSTOP 4X8 (GAUZE/BANDAGES/DRESSINGS) ×1 IMPLANT
ELECT CAUTERY BLADE 6.4 (BLADE) ×1 IMPLANT
ELECTRODE REM PT RTRN 9FT ADLT (ELECTROSURGICAL) ×1 IMPLANT
GAUZE XEROFORM 1X8 LF (GAUZE/BANDAGES/DRESSINGS) ×1 IMPLANT
GLOVE BIO SURGEON STRL SZ7.5 (GLOVE) ×4 IMPLANT
GLOVE BIO SURGEON STRL SZ8 (GLOVE) ×4 IMPLANT
GLOVE BIOGEL PI IND STRL 8 (GLOVE) ×1 IMPLANT
GLOVE INDICATOR 8.0 STRL GRN (GLOVE) ×1 IMPLANT
GOWN STRL REUS W/ TWL LRG LVL3 (GOWN DISPOSABLE) ×1 IMPLANT
GOWN STRL REUS W/ TWL XL LVL3 (GOWN DISPOSABLE) ×1 IMPLANT
HOOD PEEL AWAY T7 (MISCELLANEOUS) ×3 IMPLANT
INSERT COMP PS 8-11 GH LT (Insert) IMPLANT
KIT TURNOVER KIT A (KITS) ×1 IMPLANT
MANIFOLD NEPTUNE II (INSTRUMENTS) ×1 IMPLANT
NDL SPNL 20GX3.5 QUINCKE YW (NEEDLE) ×1 IMPLANT
NEEDLE SPNL 20GX3.5 QUINCKE YW (NEEDLE) ×1 IMPLANT
NS IRRIG 500ML POUR BTL (IV SOLUTION) ×1 IMPLANT
PACK TOTAL KNEE (MISCELLANEOUS) ×1 IMPLANT
PAD COLD UNI WRAP-ON (PAD) ×1 IMPLANT
PENCIL SMOKE EVACUATOR (MISCELLANEOUS) ×1 IMPLANT
PIN DRILL HDLS TROCAR 75 4PK (PIN) IMPLANT
SCREW FEMALE HEX FIX 25X2.5 (ORTHOPEDIC DISPOSABLE SUPPLIES) IMPLANT
SET PIN GUIDE PS PAT SPEC (INSTRUMENTS) IMPLANT
SOL .9 NS 3000ML IRR UROMATIC (IV SOLUTION) ×1 IMPLANT
STAPLER SKIN PROX 35W (STAPLE) ×1 IMPLANT
STOCKINETTE IMPERV 14X48 (MISCELLANEOUS) ×1 IMPLANT
SUCTION TUBE FRAZIER 10FR DISP (SUCTIONS) ×1 IMPLANT
SUT VIC AB 0 CT1 36 (SUTURE) ×3 IMPLANT
SUT VIC AB 2-0 CT1 TAPERPNT 27 (SUTURE) ×3 IMPLANT
SYR 10ML LL (SYRINGE) ×1 IMPLANT
SYR 20ML LL LF (SYRINGE) ×1 IMPLANT
SYR 30ML LL (SYRINGE) IMPLANT
TIP FAN IRRIG PULSAVAC PLUS (DISPOSABLE) ×1 IMPLANT
TRAP FLUID SMOKE EVACUATOR (MISCELLANEOUS) ×1 IMPLANT
WATER STERILE IRR 500ML POUR (IV SOLUTION) ×1 IMPLANT

## 2024-06-29 NOTE — Anesthesia Procedure Notes (Addendum)
 Spinal  Patient location during procedure: OR Start time: 06/29/2024 7:40 AM Reason for block: surgical anesthesia Staffing Anesthesiologist: Chesley Lendia CROME, MD Resident/CRNA: Veronica Alm BROCKS, CRNA Other anesthesia staff: Estelle Merck, Hipolito, RN Performed by: Estelle Merck, Hipolito, RN Authorized by: Chesley Lendia CROME, MD   Preanesthetic Checklist Completed: patient identified, IV checked, site marked, risks and benefits discussed, surgical consent, monitors and equipment checked, pre-op evaluation and timeout performed Spinal Block Patient position: sitting Prep: DuraPrep Patient monitoring: continuous pulse ox and blood pressure Approach: midline Location: L3-4 Injection technique: single-shot Needle Needle type: Pencan  Needle gauge: 24 G Needle length: 10 cm Assessment Sensory level: T4 Events: CSF return

## 2024-06-29 NOTE — Discharge Instructions (Addendum)
 Orthopedic discharge instructions: May shower with intact OpSite dressing. Apply ice frequently to knee or use Polar Care. Start Eliquis  1 tablet (2.5 mg) twice daily on Wednesday, 06/30/2024, for 2 weeks, then take aspirin  325 mg twice daily for 4 weeks. Take pain medication as prescribed when needed.  May supplement with ES Tylenol  if necessary. May weight-bear as tolerated on left leg - use walker for balance and support. Follow-up in 10-14 days or as scheduled.     Information for Discharge Teaching: EXPAREL  (bupivacaine  liposome injectable suspension)   Pain relief is important to your recovery. The goal is to control your pain so you can move easier and return to your normal activities as soon as possible after your procedure. Your physician may use several types of medicines to manage pain, swelling, and more.  Your surgeon or anesthesiologist gave you EXPAREL (bupivacaine ) to help control your pain after surgery.  EXPAREL  is a local anesthetic designed to release slowly over an extended period of time to provide pain relief by numbing the tissue around the surgical site. EXPAREL  is designed to release pain medication over time and can control pain for up to 72 hours. Depending on how you respond to EXPAREL , you may require less pain medication during your recovery. EXPAREL  can help reduce or eliminate the need for opioids during the first few days after surgery when pain relief is needed the most. EXPAREL  is not an opioid and is not addictive. It does not cause sleepiness or sedation.   Important! A teal colored band has been placed on your arm with the date, time and amount of EXPAREL  you have received. Please leave this armband in place for the full 96 hours following administration, and then you may remove the band. If you return to the hospital for any reason within 96 hours following the administration of EXPAREL , the armband provides important information that your health care  providers to know, and alerts them that you have received this anesthetic.    Possible side effects of EXPAREL : Temporary loss of sensation or ability to move in the area where medication was injected. Nausea, vomiting, constipation Rarely, numbness and tingling in your mouth or lips, lightheadedness, or anxiety may occur. Call your doctor right away if you think you may be experiencing any of these sensations, or if you have other questions regarding possible side effects.  Follow all other discharge instructions given to you by your surgeon or nurse. Eat a healthy diet and drink plenty of water or other fluids.

## 2024-06-29 NOTE — Transfer of Care (Signed)
 Immediate Anesthesia Transfer of Care Note  Patient: Douglas Lyons  Procedure(s) Performed: ARTHROPLASTY, KNEE, TOTAL (Left: Knee)  Patient Location: PACU  Anesthesia Type:MAC and Spinal  Level of Consciousness: awake and alert   Airway & Oxygen Therapy: Patient Spontanous Breathing and Patient connected to face mask  Post-op Assessment: Report given to RN, Post -op Vital signs reviewed and stable, and Patient moving all extremities  Post vital signs: Reviewed and stable  Last Vitals:  Vitals Value Taken Time  BP 108/66 06/29/24 10:00  Temp    Pulse 75 06/29/24 10:04  Resp 21 06/29/24 10:04  SpO2 100 % 06/29/24 10:04  Vitals shown include unfiled device data.  Last Pain:  Vitals:   06/29/24 0617  TempSrc: Temporal  PainSc: 0-No pain         Complications: No notable events documented.

## 2024-06-29 NOTE — H&P (Signed)
 History of Present Illness:  Douglas Lyons is an 78 y.o. male who presents for follow-up of his worsening left knee pain. He has a known history of degenerative joint disease of the left knee and was in the process of discussing left knee replacement surgery with Dr. Mardee prior to sustaining an intertrochanteric fracture to the left hip for which he underwent intramedullary nailing. He has recuperated well from this surgery, but continues to note moderate pain in his knee which he rates at 4/10 on today's visit. He localizes the pain to the medial aspect of his knee. His symptoms are worse with any prolonged standing or ambulation, as well as at night. He is ambulating with a cane in his right hand, and is unable to reciprocate stairs. He denies any recent history of injury to the knee, and denies any numbness or paresthesias down his leg to his foot.  Current Outpatient Medications:  acetaminophen  (TYLENOL ) 500 MG tablet Take 500-1,000 mg by mouth every 8 (eight) hours as needed for Pain  alendronate (FOSAMAX) 70 MG tablet Take 1 tablet (70 mg total) by mouth every 7 (seven) days Take on an empty stomach with a full glass of water. Avoid mineral or well water. Do not eat or take other medications for at least 30 minutes after dose. Sit or stand for at least 30 minutes after dose. 12 tablet 1  aspirin  325 MG EC tablet Take 325 mg by mouth once daily  calcium  carbonate 600 mg calcium  (1,500 mg) Tab tablet Take 600 mg by mouth once daily  enalapril  (VASOTEC ) 5 MG tablet TAKE 2 TABLETS BY MOUTH ONCE DAILY 180 tablet 1  glipiZIDE-metFORMIN  (METAGLIP) 2.5-500 mg tablet Take 1-2 tablets by mouth 2 (two) times daily with meals Take 2 tablets in the morning and 1 in the evening. 270 tablet 1  Herbal Supplement Herbal Name: Omega XL 1 capsule by mouth daily  rosuvastatin  (CRESTOR ) 5 MG tablet TAKE 1 TABLET BY MOUTH EVERY OTHER DAY 45 tablet 1  tiZANidine  (ZANAFLEX ) 2 MG tablet Take 2 mg by mouth every 6 (six)  hours as needed  VITAMIN D3 ORAL Take 1 capsule by mouth once daily  ZINC ORAL Take 1 tablet by mouth once daily   Allergies:  Rosuvastatin  Muscle Pain (Is taking every other day and he feels much better as of 12/30/2016)  Past Medical History:  Diagnosis Date  Abdominal hernia  Acute deep vein thrombosis (DVT) of left lower extremity (CMS/HHS-HCC) 12/10/2023 (post-surgically provoked. Started on xarelto)  Acute deep vein thrombosis (DVT) of popliteal vein of right lower extremity (CMS/HHS-HCC) 01/26/2021  Last Assessment & Plan: Formatting of this note might be different from the original. #Acute DVT nonocclusive popliteal vein right lower extremity [provoked postsurgical surgery]-on Eliquis  5 mg twice daily x 3 months of anticoagulation. STOPPED in end of July, 2022. #Reviewed the hypercoagulable work-up-essentially unremarkable; slightly abnormal DRV VT mixing study. 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 Viktoria in the past)  DJD (degenerative joint disease) of both knees  Erectile dysfunction (Using cialis PRN w/ good effect)  GERD (gastroesophageal reflux disease)  Heme positive stool 2013 (Seen by Dr. 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 Elspeth Ahle at [facility], [place])  ORIF PROXIMAL FEMORAL FRACTURE W/ ITST NAIL SYSTEM Left 11/24/2023  Reduction and internal fixation of displaced intertrochanteric left hip fracture with Biomet Affixis TFN nail (Dr. Edie)  ARTHROSCOPY SHOULDER Right  VASECTOMY   Family History:  Lymphoma Mother 66 (Non-Hodgkins)  Macular degeneration Mother  Colon cancer Father  Colon polyps Brother  Prostate cancer Brother  Lung cancer Paternal Uncle  Liver cancer Paternal Uncle  Leukemia Paternal Uncle  Reflux disease Brother  Cataracts Brother  Prostate cancer Brother  Prostate cancer Paternal Uncle  Macular degeneration Maternal Grandmother   Social History:   Socioeconomic History:  Marital status: Married  Spouse name: Clinical Cytogeneticist  Number of children: 2  Years of education: 12  Occupational History  Occupation: Teaching Laboratory Technician  Comment: [employer] Research [employer], [employer]  Occupation: Former it sales professional  Tobacco Use  Smoking status: Never  Passive exposure: Never  Smokeless tobacco: Never  Vaping Use  Vaping status: Never Used  Substance and Sexual Activity  Alcohol use: Yes  Comment: occasional beer  Drug use: No  Sexual activity: Not Currently  Partners: Female  Birth control/protection: None  Social History Narrative  Married at age 36 & he & wife have been married for over 50 yrs. 2 children, son & daughter w/ 2 teenage grandchildren that he is very close to.  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 Walks frequently as part of his job.   Social Drivers of Health:   Physicist, Medical Strain: Low Risk (06/25/2023)  Overall Financial Resource Strain (CARDIA)  Difficulty of Paying Living Expenses: Not hard at all  Food Insecurity: No Food Insecurity (11/24/2023)  [facility]  Hunger Vital Sign  Within the past 12 months, you worried that your food would run out before you got the money to  buy more.: Never true  Within the past 12 months, the food you bought just didn't last and you didn't have money to get more.: Never true  Transportation Needs: No Transportation Needs (11/24/2023)  Received from [facility] - Transportation  Lack of Transportation (Medical): No  Lack of Transportation (Non-Medical): No   Review of Systems:  A comprehensive 14 point ROS was performed, reviewed, and the pertinent orthopaedic findings are documented in the HPI.  Physical Exam: Vitals:  06/21/24 0914  BP: (!) 150/70  Weight: 91 kg (200 lb 9.6 oz)  Height: 180.3 cm (5' 11)  PainSc: 4  PainLoc: Knee   General/Constitutional: The patient appears to be well-nourished, well-developed, and in no acute distress. Neuro/Psych: Normal mood and affect, oriented to person, place and time. Eyes: Non-icteric. Pupils are equal, round, and reactive to light, and exhibit synchronous movement. ENT: Unremarkable. Lymphatic: No palpable adenopathy. Respiratory: Lungs clear to auscultation, Normal chest excursion, No wheezes, and Non-labored breathing Cardiovascular: Regular rate and rhythm. No murmurs. and No edema, swelling or tenderness, except as noted in detailed exam. Integumentary: No impressive skin lesions present, except as noted in detailed exam. Musculoskeletal: Unremarkable, except as noted in detailed exam.  Left knee exam: The patient ambulates with a mild limp, favoring his left leg, and is using a cane  for balance and support. Skin inspection is notable for minimal swelling diffusely around the knee, as well as a trace effusion. No erythema, ecchymosis, abrasions, or other skin abnormalities are identified. He has mild-moderate tenderness to palpation along the medial joint line, and mild tenderness laterally. Actively, he lacks 10 degrees of extension but is able to flex his knee to 85 degrees. Passively, the knee can be extended to within 5 degrees of full extension and flexed to  90 degrees. His patella tracks well, but exhibits mild crepitance. The knee exhibits mild pseudolaxity to varus stressing. He is grossly neurovascularly intact to the left lower extremity and foot.  Imaging:  AP weightbearing of both knees, as well as lateral and merchant views of the left knee are obtained. These films demonstrate severe degenerative changes, primarily involving the medial compartment with 100% medial joint space narrowing. Lesser degenerative changes of the lateral and patellofemoral compartments also are noted. Overall alignment is moderate varus. No fractures, lytic lesions, or abnormal calcifications are identified.  Assessment: 1. Primary osteoarthritis of left knee.   Plan: The treatment options were discussed with the patient and his wife. In addition, patient educational materials were provided regarding the diagnosis and treatment options. Regarding his left knee symptoms, the patient is quite frustrated by his worsening symptoms and function limitations, and is ready to consider more aggressive treatment options. Therefore, I have recommended a surgical procedure, specifically a left total knee arthroplasty. The procedure was discussed with the patient, as were the potential risks (including bleeding, infection, nerve and/or blood vessel injury, persistent or recurrent pain, loosening and/or failure of the components, dislocation, need for further surgery, blood clots, strokes, heart attacks and/or arhythmias, pneumonia, etc.) and benefits. The patient states his/her understanding and wishes to proceed. All of the patient's questions and concerns were answered. He can call any time with further concerns. He will follow up post-surgery, routine.    H&P reviewed and patient re-examined. No changes.

## 2024-06-29 NOTE — Progress Notes (Signed)
 The beneficiary has a mobility limitation that significantly impairs his ability to participate in one or more mobility-related activities of daily living (MRADL) in the home. The patient is able to safely use the walker. The functional mobility deficit can be sufficiently resolved by use of a rolling walker.

## 2024-06-29 NOTE — Evaluation (Signed)
 Physical Therapy Evaluation Patient Details Name: Douglas Lyons MRN: [id number] DOB: [birth year] Today's Date: 06/29/2024  History of Present Illness  Pt is a 78 y.o. male s/p L TKA on 06/29/24.  Clinical Impression  Pt admitted with above diagnosis. Pt currently with functional limitations due to the deficits listed below (see PT Problem List). Pt received upright in bed agreeable to PT. Pt familiar to education as had recent R TKA. Reports being independent at baseline.   To date, began session with education as listed below. Pt remains with some limited ankle DF likely from nerve block but has adequate quad activation. Pt is independent with bed mobility, supervision for STS to RW, and ambulation ~220'-250' using RW with step through gait. Completes stair training safely with correct LE sequencing and rail use. Pt returns to bed reviewing HEP as listed below. Pt completing needed education and mobility tasks for safe SDDC to previous environment. Pt with all needs in reach.       If plan is discharge home, recommend the following: A little help with walking and/or transfers;A little help with bathing/dressing/bathroom;Assist for transportation;Help with stairs or ramp for entrance   Can travel by private vehicle        Equipment Recommendations Rolling walker (2 wheels)  Recommendations for Other Services       Functional Status Assessment Patient has had a recent decline in their functional status and demonstrates the ability to make significant improvements in function in a reasonable and predictable amount of time.     Precautions / Restrictions Precautions Precautions: Knee Precaution Booklet Issued: Yes (comment) Recall of Precautions/Restrictions: Intact Restrictions Weight Bearing Restrictions Per Provider Order: Yes RLE Weight Bearing Per Provider Order: Weight bearing as tolerated      Mobility  Bed Mobility Overal bed mobility: Modified Independent                Patient Response: Cooperative  Transfers Overall transfer level: Needs assistance Equipment used: Rolling walker (2 wheels) Transfers: Sit to/from Stand Sit to Stand: Supervision                Ambulation/Gait Ambulation/Gait assistance: Supervision Gait Distance (Feet): 220 Feet Assistive device: Rolling walker (2 wheels) Gait Pattern/deviations: Step-through pattern       General Gait Details: consistent LLE heel strike at initial contact.  Stairs Stairs: Yes Stairs assistance: Supervision Stair Management: One rail Right, One rail Left, Step to pattern, Forwards Number of Stairs: 4 General stair comments: correct LE sequencing  Wheelchair Mobility     Tilt Bed Tilt Bed Patient Response: Cooperative  Modified Rankin (Stroke Patients Only)       Balance Overall balance assessment: Needs assistance Sitting-balance support: No upper extremity supported, Feet supported Sitting balance-Leahy Scale: Good Sitting balance - Comments: able to lean anterior BOS to doff sock on LLE   Standing balance support: Bilateral upper extremity supported, During functional activity, Reliant on assistive device for balance Standing balance-Leahy Scale: Fair                               Pertinent Vitals/Pain Pain Assessment Pain Assessment: Faces Faces Pain Scale: No hurt    Home Living Family/patient expects to be discharged to:: Private residence Living Arrangements: Spouse/significant other Available Help at Discharge: Family;Available 24 hours/day Type of Home: House Home Access: Stairs to enter Entrance Stairs-Rails: Can reach both;Left;Right Entrance Stairs-Number of Steps: 3   Home Layout: One level  Additional Comments: Borrowing RW from friend.    Prior Function Prior Level of Function : Independent/Modified Independent                     Extremity/Trunk Assessment   Upper Extremity Assessment Upper Extremity Assessment:  Overall WFL for tasks assessed    Lower Extremity Assessment Lower Extremity Assessment: LLE deficits/detail LLE Deficits / Details: expected ROM deficits    Cervical / Trunk Assessment Cervical / Trunk Assessment: Normal  Communication   Communication Communication: No apparent difficulties    Cognition Arousal: Alert Behavior During Therapy: WFL for tasks assessed/performed   PT - Cognitive impairments: No apparent impairments                         Following commands: Intact       Cueing Cueing Techniques: Verbal cues     General Comments      Exercises Total Joint Exercises Quad Sets: AROM, Strengthening, Left, 5 reps, Supine Heel Slides: AROM, Strengthening, Left, 5 reps, Seated Hip ABduction/ADduction: AROM, Strengthening, Left, 10 reps, Supine Straight Leg Raises: AROM, Strengthening, Left, 10 reps, Supine Goniometric ROM: 4-100 degrees Other Exercises Other Exercises: Role of PT in acute setting, d/c recs, knee positioning, stair training, HEP   Assessment/Plan    PT Assessment Patient needs continued PT services  PT Problem List Decreased strength;Decreased range of motion;Decreased activity tolerance;Decreased balance;Decreased mobility;Pain       PT Treatment Interventions DME instruction;Gait training;Patient/family education;Stair training;Functional mobility training;Therapeutic activities;Therapeutic exercise;Balance training;Neuromuscular re-education    PT Goals (Current goals can be found in the Care Plan section)  Acute Rehab PT Goals Patient Stated Goal: To return home PT Goal Formulation: With patient Time For Goal Achievement: 07/13/24 Potential to Achieve Goals: Good    Frequency BID     Co-evaluation               AM-PAC PT 6 Clicks Mobility  Outcome Measure Help needed turning from your back to your side while in a flat bed without using bedrails?: A Little Help needed moving from lying on your back to  sitting on the side of a flat bed without using bedrails?: A Little Help needed moving to and from a bed to a chair (including a wheelchair)?: A Little Help needed standing up from a chair using your arms (e.g., wheelchair or bedside chair)?: A Little Help needed to walk in hospital room?: A Little Help needed climbing 3-5 steps with a railing? : A Little 6 Click Score: 18    End of Session Equipment Utilized During Treatment: Gait belt Activity Tolerance: Patient tolerated treatment well Patient left: in bed;with call bell/phone within reach;with bed alarm set;with nursing/sitter in room;with family/visitor present Nurse Communication: Mobility status PT Visit Diagnosis: Difficulty in walking, not elsewhere classified (R26.2);Muscle weakness (generalized) (M62.81)    Time: 8595-8561 PT Time Calculation (min) (ACUTE ONLY): 34 min   Charges:   PT Evaluation $PT Eval Low Complexity: 1 Low PT Treatments $Gait Training: 8-22 mins PT General Charges $$ ACUTE PT VISIT: 1 Visit        Dorina HERO. Fairly IV, PT, DPT Physical Therapist- [place] 06/29/2024, 3:20 PM

## 2024-06-29 NOTE — Op Note (Signed)
 06/29/2024  9:55 AM  Patient:   Douglas Lyons  Pre-Op Diagnosis:   Advanced degenerative joint disease status post prior intramedullary nailing of displaced intertrochanteric left hip fracture, left knee.  Post-Op Diagnosis:   Same  Procedure:   Left TKA using Signature-guided all-pressfit Zimmer Persona system with a #11 PCR femur, a(n) G-sized  tibial tray with an 11 mmneedle congruent E-poly insert, and a 9.5 x 35 mm all-poly 3-pegged domed patella.  Surgeon:   DOROTHA Reyes Maltos, MD  Assistant:   Gustavo Level, PA-C; HILLARY Hummer, PA-S  Anesthesia:   Spinal  Findings:   As above  Complications:   None  EBL:   5 cc  Fluids:   300 cc crystalloid  UOP:   None  TT:   90 minutes at 300 mmHg  Drains:   None  Closure:   Staples  Implants:   As above  Brief Clinical Note:   The patient is an 78 year old male with a long history of progressively worsening left knee pain. The patient's symptoms have progressed despite medications, activity modification, injections, etc. The patient's history and examination were consistent with advanced degenerative joint disease of the left knee confirmed by plain radiographs. The patient presents at this time for a left total knee arthroplasty.  However, because of a prior intramedullary nailing of an intertrochanteric hip fracture with a long nail, the Signature-guided system was required.  Procedure:   The patient was brought into the operating room. After adequate spinal anesthesia was obtained, the patient was repositioned in the supine position on the operating room table. The left lower extremity was prepped with ChloraPrep solution and draped sterilely. Preoperative antibiotics were administered. A timeout was performed to verify the appropriate surgical site before the limb was exsanguinated with an Esmarch and the tourniquet inflated to 300 mmHg.   A standard anterior approach to the knee was made through an approximately 6-7 inch incision.  The incision was carried down through the subcutaneous tissues to expose superficial retinaculum. This was split the length of the incision and the medial flap elevated sufficiently to expose the medial retinaculum. The medial retinaculum was incised, leaving a 3-4 mm cuff of tissue on the patella. This was extended distally along the medial border of the patellar tendon and proximally through the medial third of the quadriceps tendon. A subtotal fat pad excision was performed before the soft tissues were elevated off the anteromedial and anterolateral aspects of the proximal tibia to the level of the collateral ligaments. The anterior portions of the medial and lateral menisci were removed, as was the anterior cruciate ligament.   With the knee flexed to 90, the femoral Signature guide was positioned over the distal femur. After verifying its position, the appropriate distal and anterior pins placed. The distal pins were removed before the guide was removed. The distal cut was made using the appropriate distal femoral cutting block.    Attention was then directed to the tibial side. Utilizing the tibial Signature guide, the guide was positioned appropriately over the anterior aspect of the proximal tibia. After verifying its position, the two anterior pins were placed before removing the guide. The tibial cutting block was positioned and the appropriate proximal tibial cut made. This piece was taken to the back table where it was measured and found to be optimally replicated by a(n) G-sized component.  Attention was redirected to the distal femur. The distal femur was measured and found to be optimally replicated by the #11 component.  Utilizing the previously placed drill holes, the #11 4-in-1 cutting block was positioned. However, it was noted to be in too much external rotation. Therefore, this rotation was corrected 4 degrees utilizing the 2 degree correction guide twice. Once this was corrected, the  number eleven 4-in-1 cutting block was positioned down found to look appropriate. First the posterior, then the posterior chamfer, the anterior, and finally the anterior chamfer cuts were made after verifying that the anterior cortex would not be notched.   At this point, the posterior portions medial and lateral menisci were removed. A trial reduction was performed using the appropriate femoral and tibial components with first the 10 mm and then the 11 mm insert. The 11 mm insert demonstrated excellent stability to varus and valgus stressing both in flexion and extension while permitting full extension. Patellar tracking was assessed and found to be excellent. The tibial trial position was marked on the proximal tibia. The patella thickness was measured and found to be 22 mm, so the appropriate cut was made. The patellar surface was measured and found to be optimally replicated by the 35 mm component. The three peg holes were drilled in place before the trial button was inserted. Patella tracking was assessed and found to be excellent, passing the no thumb test. The lug holes were drilled into the distal femur before the trial component was removed.  The tibial tray was repositioned before the keel was created using the appropriate tower, drills, and punch.  The bony surfaces were prepared for implantation by irrigating them thoroughly with sterile saline solution via the jet lavage system. A bone plug was fashioned from some of the bone that had been removed previously and used to plug the distal femoral canal. In addition, a cocktail of 20 cc of Exparel , 30 cc of 0.5% Sensorcaine , 2 cc of Kenalog  40 (80 mg), and 30 mg of Toradol  diluted out to 90 cc with normal saline was injected into the postero-medial and postero-lateral aspects of the knee, the medial and lateral gutter regions, and the peri-incisional tissues to help with postoperative analgesia.    The tibial tray was impacted into place first  with care taken to be sure the component was fully seated. Next, the femoral component was impacted into place again with care taken to be sure that the component was fully improperly seated. The permanent 11 mm medial congruent E-polyethylene insert was snapped into place with care taken to ensure appropriate locking of the insert. Finally, the patella was positioned and compressed into place using the patellar clamp. Again, care was taken to be sure that the component was fully seated. The knee was placed through a range of motion with the findings as described above.    The wound was copiously irrigated with sterile saline solution using the jet lavage system before the quadriceps tendon and retinacular layer were reapproximated using #0 Vicryl interrupted sutures. The superficial retinacular layer also was closed using a running #0 Vicryl suture. The subcutaneous tissues were closed in several layers using 2-0 Vicryl interrupted sutures. The skin was closed using staples. A sterile honeycomb dressing was applied to the skin before the leg was wrapped with an Ace wrap to accommodate the Polar Care device. The patient was then awakened and returned to the recovery room in satisfactory condition after tolerating the procedure well.

## 2024-06-29 NOTE — Anesthesia Preprocedure Evaluation (Signed)
 Anesthesia Evaluation  Patient identified by MRN, date of birth, ID band Patient awake    Reviewed: Allergy & Precautions, NPO status , Patient's Chart, lab work & pertinent test results  History of Anesthesia Complications Negative for: history of anesthetic complications  Airway Mallampati: III  TM Distance: <3 FB Neck ROM: full    Dental no notable dental hx.    Pulmonary neg pulmonary ROS, neg shortness of breath   Pulmonary exam normal        Cardiovascular Exercise Tolerance: Good hypertension, (-) angina (-) Past MI and (-) DOE Normal cardiovascular exam     Neuro/Psych negative neurological ROS  negative psych ROS   GI/Hepatic Neg liver ROS,GERD  Controlled,,  Endo/Other  diabetes, Type 2    Renal/GU Renal disease     Musculoskeletal   Abdominal   Peds  Hematology negative hematology ROS (+)   Anesthesia Other Findings Past Medical History: No date: Cataracts, bilateral No date: Degenerative joint disease of knee, left No date: Diabetes mellitus, type 2 (HCC) No date: Diverticulosis No date: Erectile dysfunction No date: Essential hypertension with goal blood pressure less than  140/90 No date: GERD (gastroesophageal reflux disease) No date: Heme positive stool No date: Hip fracture, left, closed, with routine healing, subsequent  encounter No date: Hyperlipidemia No date: Hypertension 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 No date: Other specified disorders of bone density and structure,  left thigh No date: Overweight (BMI 25.0-29.9) No date: Postoperative acute deep vein thrombosis (DVT) of lower  extremity, subsequent encounter [facility])  Past Surgical History: 12/01/2020: ANTERIOR CERVICAL DECOMP/DISCECTOMY FUSION; N/A     Comment:  Procedure: ANTERIOR CERVICAL DECOMPRESSION/DISCECTOMY               FUSION 2 LEVELS C3-4, C4-5;  Surgeon: Bluford Standing, MD;                Location: [facility] ORS;  Service:  Neurosurgery;  Laterality:               N/A; No date: COLONOSCOPY 07/26/2015: COLONOSCOPY; N/A     Comment:  Procedure: COLONOSCOPY;  Surgeon: Lamar ONEIDA Holmes, MD;               Location: [facility] ENDOSCOPY;  Service: Endoscopy;                Laterality: N/A; 09/29/2020: COLONOSCOPY; N/A     Comment:  Procedure: COLONOSCOPY;  Surgeon: Maryruth Ole ONEIDA,               MD;  Location: [facility] ENDOSCOPY;  Service: Endoscopy;                Laterality: N/A; No date: EYE SURGERY; Bilateral 11/24/2023: INTRAMEDULLARY (IM) NAIL INTERTROCHANTERIC; Left     Comment:  Procedure: FIXATION, FRACTURE, INTERTROCHANTERIC, WITH               INTRAMEDULLARY ROD;  Surgeon: Edie Norleen PARAS, MD;                Location: [facility] ORS;  Service: Orthopedics;  Laterality:               Left; No date: SHOULDER ARTHROSCOPY; Right No date: VASECTOMY     Reproductive/Obstetrics negative OB ROS                              Anesthesia Physical Anesthesia Plan  ASA: 3  Anesthesia Plan: Spinal   Post-op  Pain Management: Ofirmev  IV (intra-op)* and Toradol  IV (intra-op)*   Induction: Intravenous  PONV Risk Score and Plan: Propofol  infusion and TIVA  Airway Management Planned: Natural Airway and Nasal Cannula  Additional Equipment:   Intra-op Plan:   Post-operative Plan:   Informed Consent: I have reviewed the patients History and Physical, chart, labs and discussed the procedure including the risks, benefits and alternatives for the proposed anesthesia with the patient or authorized representative who has indicated his/her understanding and acceptance.     Dental Advisory Given  Plan Discussed with: Anesthesiologist, CRNA and Surgeon  Anesthesia Plan Comments: (Patient reports no bleeding problems and no anticoagulant use.  Plan for spinal with backup GA  Patient consented for risks of anesthesia including but not limited to:  - adverse reactions to medications - damage to  eyes, teeth, lips or other oral mucosa - nerve damage due to positioning  - risk of bleeding, infection and or nerve damage from spinal that could lead to paralysis - risk of headache or failed spinal - damage to teeth, lips or other oral mucosa - sore throat or hoarseness - damage to heart, brain, nerves, lungs, other parts of body or loss of life  Patient voiced understanding and assent.)         Anesthesia Quick Evaluation

## 2024-06-30 ENCOUNTER — Encounter: Payer: Self-pay | Admitting: Surgery

## 2024-06-30 NOTE — Anesthesia Postprocedure Evaluation (Signed)
 Anesthesia Post Note  Patient: Douglas Lyons  Procedure(s) Performed: ARTHROPLASTY, KNEE, TOTAL (Left: Knee)  Patient location during evaluation: PACU Anesthesia Type: Spinal Level of consciousness: awake and alert Pain management: pain level controlled Vital Signs Assessment: post-procedure vital signs reviewed and stable Respiratory status: spontaneous breathing and respiratory function stable Cardiovascular status: blood pressure returned to baseline and stable Postop Assessment: spinal receding Anesthetic complications: no   No notable events documented.   Last Vitals:  Vitals:   06/29/24 1307 06/29/24 1631  BP: (!) 143/74 (!) 174/79  Pulse: 66 70  Resp: 18   Temp: 36.4 C (!) 36.3 C  SpO2: 99% 100%    Last Pain:  Vitals:   06/29/24 1631  TempSrc: Temporal  PainSc:                  [name]
